# Patient Record
Sex: Male | Born: 1940 | Race: White | Hispanic: No | Marital: Married | Smoking: Former smoker
Health system: Southern US, Community
[De-identification: ages and names within clinical notes are randomized; demographics above are authoritative.]

## PROBLEM LIST (undated history)

## (undated) DIAGNOSIS — R0602 Shortness of breath: Secondary | ICD-10-CM

## (undated) DIAGNOSIS — I10 Essential (primary) hypertension: Secondary | ICD-10-CM

## (undated) DIAGNOSIS — J189 Pneumonia, unspecified organism: Secondary | ICD-10-CM

## (undated) DIAGNOSIS — R630 Anorexia: Secondary | ICD-10-CM

## (undated) DIAGNOSIS — I639 Cerebral infarction, unspecified: Secondary | ICD-10-CM

## (undated) DIAGNOSIS — C801 Malignant (primary) neoplasm, unspecified: Secondary | ICD-10-CM

## (undated) DIAGNOSIS — C3492 Malignant neoplasm of unspecified part of left bronchus or lung: Secondary | ICD-10-CM

## (undated) DIAGNOSIS — R6889 Other general symptoms and signs: Secondary | ICD-10-CM

## (undated) DIAGNOSIS — J449 Chronic obstructive pulmonary disease, unspecified: Secondary | ICD-10-CM

## (undated) HISTORY — DX: Other general symptoms and signs: R68.89

## (undated) HISTORY — PX: HEMORRHOID SURGERY: SHX153

## (undated) HISTORY — DX: Shortness of breath: R06.02

## (undated) HISTORY — PX: FINGER SURGERY: SHX640

## (undated) HISTORY — DX: Malignant neoplasm of unspecified part of left bronchus or lung: C34.92

## (undated) HISTORY — DX: Anorexia: R63.0

---

## 2005-06-07 ENCOUNTER — Ambulatory Visit: Payer: Self-pay | Admitting: Internal Medicine

## 2007-02-19 ENCOUNTER — Ambulatory Visit: Payer: Self-pay | Admitting: Gastroenterology

## 2007-11-04 ENCOUNTER — Ambulatory Visit: Payer: Self-pay | Admitting: Internal Medicine

## 2008-03-02 ENCOUNTER — Ambulatory Visit: Payer: Self-pay | Admitting: Internal Medicine

## 2008-08-12 ENCOUNTER — Ambulatory Visit: Payer: Self-pay | Admitting: Internal Medicine

## 2008-12-22 ENCOUNTER — Ambulatory Visit: Payer: Self-pay | Admitting: Ophthalmology

## 2009-01-03 ENCOUNTER — Ambulatory Visit: Payer: Self-pay | Admitting: Ophthalmology

## 2009-04-08 ENCOUNTER — Ambulatory Visit: Payer: Self-pay | Admitting: Internal Medicine

## 2009-05-05 ENCOUNTER — Ambulatory Visit: Payer: Self-pay | Admitting: Internal Medicine

## 2009-05-09 ENCOUNTER — Ambulatory Visit: Payer: Self-pay | Admitting: Internal Medicine

## 2009-06-08 ENCOUNTER — Ambulatory Visit: Payer: Self-pay | Admitting: Internal Medicine

## 2009-07-09 ENCOUNTER — Ambulatory Visit: Payer: Self-pay | Admitting: Internal Medicine

## 2009-07-26 ENCOUNTER — Ambulatory Visit: Payer: Self-pay | Admitting: Internal Medicine

## 2009-08-09 ENCOUNTER — Ambulatory Visit: Payer: Self-pay | Admitting: Internal Medicine

## 2009-12-09 ENCOUNTER — Ambulatory Visit: Payer: Self-pay | Admitting: Internal Medicine

## 2009-12-13 ENCOUNTER — Ambulatory Visit: Payer: Self-pay | Admitting: Internal Medicine

## 2010-01-09 ENCOUNTER — Ambulatory Visit: Payer: Self-pay | Admitting: Internal Medicine

## 2010-07-09 ENCOUNTER — Ambulatory Visit: Payer: Self-pay | Admitting: Internal Medicine

## 2010-07-16 ENCOUNTER — Ambulatory Visit: Payer: Self-pay | Admitting: Internal Medicine

## 2012-07-13 ENCOUNTER — Ambulatory Visit: Payer: Self-pay

## 2012-07-22 ENCOUNTER — Ambulatory Visit: Payer: Self-pay | Admitting: Internal Medicine

## 2012-12-14 ENCOUNTER — Ambulatory Visit: Payer: Self-pay | Admitting: Ophthalmology

## 2012-12-14 LAB — POTASSIUM: Potassium: 4.4 mmol/L (ref 3.5–5.1)

## 2012-12-29 ENCOUNTER — Ambulatory Visit: Payer: Self-pay | Admitting: Ophthalmology

## 2014-07-19 DIAGNOSIS — I493 Ventricular premature depolarization: Secondary | ICD-10-CM | POA: Insufficient documentation

## 2014-12-26 DIAGNOSIS — I482 Chronic atrial fibrillation, unspecified: Secondary | ICD-10-CM | POA: Insufficient documentation

## 2015-03-15 ENCOUNTER — Ambulatory Visit
Admit: 2015-03-15 | Disposition: A | Discharge status: Home / Self Care | Payer: Self-pay | Attending: Internal Medicine | Admitting: Internal Medicine

## 2015-03-16 ENCOUNTER — Ambulatory Visit: Admit: 2015-03-16 | Disposition: A | Discharge status: Home / Self Care | Payer: Self-pay | Admitting: Neurology

## 2015-03-16 ENCOUNTER — Inpatient Hospital Stay
Admit: 2015-03-16 | Disposition: A | Discharge status: Other Acute Inpatient Hospital | Payer: Self-pay | Attending: Internal Medicine | Admitting: Internal Medicine

## 2015-03-16 LAB — URINALYSIS, COMPLETE
Bacteria: NONE SEEN
Bilirubin,UR: NEGATIVE
Blood: NEGATIVE
Glucose,UR: NEGATIVE mg/dL (ref 0–75)
Ketone: NEGATIVE
Nitrite: NEGATIVE
PROTEIN: NEGATIVE
Ph: 7 (ref 4.5–8.0)
Specific Gravity: 1.005 (ref 1.003–1.030)
Squamous Epithelial: NONE SEEN
WBC UR: 2 /HPF (ref 0–5)

## 2015-03-16 LAB — CBC WITH DIFFERENTIAL/PLATELET
Basophil #: 0 10*3/uL (ref 0.0–0.1)
Basophil %: 0.5 %
Eosinophil #: 0 10*3/uL (ref 0.0–0.7)
Eosinophil %: 0.4 %
HCT: 46.6 % (ref 40.0–52.0)
HGB: 15.4 g/dL (ref 13.0–18.0)
Lymphocyte #: 1.2 10*3/uL (ref 1.0–3.6)
Lymphocyte %: 13.3 %
MCH: 29.2 pg (ref 26.0–34.0)
MCHC: 33.1 g/dL (ref 32.0–36.0)
MCV: 88 fL (ref 80–100)
Monocyte #: 0.7 x10 3/mm (ref 0.2–1.0)
Monocyte %: 8.4 %
NEUTROS ABS: 6.7 10*3/uL — AB (ref 1.4–6.5)
Neutrophil %: 77.4 %
Platelet: 202 10*3/uL (ref 150–440)
RBC: 5.29 10*6/uL (ref 4.40–5.90)
RDW: 13.9 % (ref 11.5–14.5)
WBC: 8.7 10*3/uL (ref 3.8–10.6)

## 2015-03-16 LAB — LIPID PANEL
Cholesterol: 136 mg/dL
HDL Cholesterol: 46 mg/dL
Ldl Cholesterol, Calc: 77 mg/dL
Triglycerides: 66 mg/dL
VLDL Cholesterol, Calc: 13 mg/dL

## 2015-03-16 LAB — COMPREHENSIVE METABOLIC PANEL
Albumin: 3.8 g/dL
Alkaline Phosphatase: 95 U/L
Anion Gap: 12 (ref 7–16)
BUN: 14 mg/dL
Bilirubin,Total: 0.9 mg/dL
Calcium, Total: 8.9 mg/dL
Chloride: 101 mmol/L
Co2: 23 mmol/L
Creatinine: 1.11 mg/dL
EGFR (African American): >60
EGFR (Non-African Amer.): >60
Glucose: 164 mg/dL — ABNORMAL HIGH
Potassium: 3.7 mmol/L
SGOT(AST): 30 U/L
SGPT (ALT): 25 U/L
Sodium: 136 mmol/L
Total Protein: 7.2 g/dL

## 2015-03-16 LAB — TSH: Thyroid Stimulating Horm: 3.258 u[IU]/mL

## 2015-03-16 LAB — PROTIME-INR
INR: 1.1
Prothrombin Time: 14.1 secs

## 2015-03-16 LAB — MAGNESIUM: Magnesium: 1.9 mg/dL

## 2015-03-16 LAB — TROPONIN I
Troponin-I: 0.05 ng/mL — ABNORMAL HIGH
Troponin-I: 0.05 ng/mL — ABNORMAL HIGH
Troponin-I: 0.06 ng/mL — ABNORMAL HIGH

## 2015-03-17 ENCOUNTER — Ambulatory Visit (HOSPITAL_COMMUNITY)
Admission: AD | Admit: 2015-03-17 | Discharge: 2015-03-17 | Disposition: A | Discharge status: Home / Self Care | Payer: Medicare Other | Source: Other Acute Inpatient Hospital | Attending: Internal Medicine | Admitting: Internal Medicine

## 2015-03-17 DIAGNOSIS — I639 Cerebral infarction, unspecified: Secondary | ICD-10-CM | POA: Insufficient documentation

## 2015-03-17 DIAGNOSIS — R531 Weakness: Secondary | ICD-10-CM | POA: Diagnosis not present

## 2015-03-17 DIAGNOSIS — I482 Chronic atrial fibrillation, unspecified: Secondary | ICD-10-CM | POA: Insufficient documentation

## 2015-03-17 LAB — BASIC METABOLIC PANEL
ANION GAP: 7 (ref 7–16)
BUN: 12 mg/dL
CALCIUM: 8.6 mg/dL — AB
Chloride: 107 mmol/L
Co2: 25 mmol/L
Creatinine: 1.04 mg/dL
EGFR (African American): >60
EGFR (Non-African Amer.): >60
GLUCOSE: 142 mg/dL — AB
POTASSIUM: 4 mmol/L
SODIUM: 139 mmol/L

## 2015-03-17 LAB — CBC WITH DIFFERENTIAL/PLATELET
Basophil #: 0 10*3/uL (ref 0.0–0.1)
Basophil %: 0.6 %
EOS ABS: 0.1 10*3/uL (ref 0.0–0.7)
Eosinophil %: 1.2 %
HCT: 45.4 % (ref 40.0–52.0)
HGB: 15.1 g/dL (ref 13.0–18.0)
LYMPHS PCT: 15 %
Lymphocyte #: 1.1 10*3/uL (ref 1.0–3.6)
MCH: 29.5 pg (ref 26.0–34.0)
MCHC: 33.4 g/dL (ref 32.0–36.0)
MCV: 88 fL (ref 80–100)
Monocyte #: 0.7 x10 3/mm (ref 0.2–1.0)
Monocyte %: 9.3 %
NEUTROS PCT: 73.9 %
Neutrophil #: 5.3 10*3/uL (ref 1.4–6.5)
Platelet: 176 10*3/uL (ref 150–440)
RBC: 5.14 10*6/uL (ref 4.40–5.90)
RDW: 14 % (ref 11.5–14.5)
WBC: 7.2 10*3/uL (ref 3.8–10.6)

## 2015-03-24 ENCOUNTER — Other Ambulatory Visit: Payer: Self-pay | Admitting: Internal Medicine

## 2015-03-24 DIAGNOSIS — R059 Cough, unspecified: Secondary | ICD-10-CM

## 2015-03-24 DIAGNOSIS — R05 Cough: Secondary | ICD-10-CM

## 2015-03-31 NOTE — Op Note (Signed)
DATE OF BIRTH:  05/05/1941  DATE OF PROCEDURE:  12/29/2012  PREOPERATIVE DIAGNOSIS: Visually significant cataract of the left eye.   POSTOPERATIVE DIAGNOSIS: Visually significant cataract of the left eye.   OPERATIVE PROCEDURE: Cataract extraction by phacoemulsification with implant of intraocular lens to left eye.   SURGEON: Birder Robson, MD.   ANESTHESIA:  1. Managed anesthesia care.  2. Topical tetracaine drops followed by 2% Xylocaine jelly applied in the preoperative holding area.   COMPLICATIONS: None.   TECHNIQUE:  Stop and chop.  DESCRIPTION OF PROCEDURE: The patient was examined and consented in the preoperative holding area where the aforementioned topical anesthesia was applied to the left eye and then brought back to the Operating Room where the left eye was prepped and draped in the usual sterile ophthalmic fashion and a lid speculum was placed. A paracentesis was created with the side port blade and the anterior chamber was filled with viscoelastic. A near clear corneal incision was performed with the steel keratome. A continuous curvilinear capsulorrhexis was performed with a cystotome followed by the capsulorrhexis forceps. Hydrodissection and hydrodelineation were carried out with BSS on a blunt cannula. The lens was removed in a stop and chop technique and the remaining cortical material was removed with the irrigation-aspiration handpiece. The capsular bag was inflated with viscoelastic and the Tecnis SN60WF 19.0-diopter lens, serial number [id number] was placed in the capsular bag without complication. The remaining viscoelastic was removed from the eye with the irrigation-aspiration handpiece. The wounds were hydrated. The anterior chamber was flushed with Miostat and the eye was inflated to physiologic pressure. 0.1 mL of cefuroxime concentration 10 mg/mL was placed in the anterior chamber. The wounds were found to be water tight. The eye was dressed with Vigamox. The  patient was given protective glasses to wear throughout the day and a shield with which to sleep tonight. The patient was also given drops with which to begin a drop regimen today and will follow-up with me in one day.    ____________________________ Livingston Diones. Iyonna Rish, MD wlp:ms D: 12/29/2012 15:44:55 ET T: 12/29/2012 23:17:30 ET JOB#: [id number]  cc: Taraoluwa Thakur L. Mariusz Jubb, MD, <Dictator> Livingston Diones Sarahanne Novakowski MD ELECTRONICALLY SIGNED 01/14/2013 14:23

## 2015-04-07 ENCOUNTER — Ambulatory Visit (HOSPITAL_COMMUNITY): Payer: Medicare Other | Attending: Internal Medicine | Admitting: Physical Therapy

## 2015-04-07 ENCOUNTER — Ambulatory Visit (HOSPITAL_COMMUNITY): Payer: Medicare Other | Admitting: Occupational Therapy

## 2015-04-07 ENCOUNTER — Encounter (HOSPITAL_COMMUNITY): Payer: Self-pay | Admitting: Occupational Therapy

## 2015-04-07 DIAGNOSIS — I639 Cerebral infarction, unspecified: Secondary | ICD-10-CM

## 2015-04-07 DIAGNOSIS — R278 Other lack of coordination: Secondary | ICD-10-CM | POA: Diagnosis not present

## 2015-04-07 DIAGNOSIS — I69351 Hemiplegia and hemiparesis following cerebral infarction affecting right dominant side: Secondary | ICD-10-CM | POA: Diagnosis present

## 2015-04-07 DIAGNOSIS — R279 Unspecified lack of coordination: Secondary | ICD-10-CM

## 2015-04-07 DIAGNOSIS — R29898 Other symptoms and signs involving the musculoskeletal system: Secondary | ICD-10-CM

## 2015-04-07 DIAGNOSIS — R2689 Other abnormalities of gait and mobility: Secondary | ICD-10-CM

## 2015-04-07 NOTE — Patient Instructions (Signed)
Strengthening: Straight Leg Raise (Phase 1)   Tighten muscles on front of right thigh, then lift leg __18__ inches from surface, keeping knee locked.  Repeat ___10_ times per set. Do __1__ sets per session. Do ___2_ sessions per day.  http://orth.exer.us/614   Copyright  VHI. All rights reserved.  Bridging   Slowly raise buttocks from floor, keeping stomach tight. Repeat __10__ times per set. Do __1__ sets per session. Do __2__ sessions per day.  http://orth.exer.us/1096   Copyright  VHI. All rights reserved.  Strengthening: Hip Abduction (Side-Lying)   Tighten muscles on front of right thigh, then lift leg _18___ inches from surface, keeping knee locked.  Repeat _10___ times per set. Do _1___ sets per session. Do __3__ sessions per day.  http://orth.exer.us/622   Copyright  VHI. All rights reserved.  Functional Quadriceps: Chair Squat   Keeping feet flat on floor, shoulder width apart, squat as low as is comfortable. Use support as necessary. Repeat __10__ times per set. Do _1___ sets per session. Do ___2_ sessions per day.  http://orth.exer.us/736   Copyright  VHI. All rights reserved.  Straight Leg Raise (Prone)   Abdomen and head supported, keep left knee locked and raise leg at hip. Avoid arching low back. Repeat _10___ times per set. Do _1___ sets per session. Do _2___ sessions per day.  http://orth.exer.us/1112   Copyright  VHI. All rights reserved.  Heel Raise: Bilateral (Standing)   Rise on balls of feet. Repeat _10___ times per set. Do 1____ sets per session. Do __2__ sessions per day.  http://orth.exer.us/38   Copyright  VHI. All rights reserved.

## 2015-04-07 NOTE — Patient Instructions (Signed)
Home Exercises Program Theraputty Exercises  Do the following exercises 2 times a day using your affected hand.  1. Roll putty into a ball.  2. Make into a pancake.  3. Roll putty into a roll.  4. Pinch along log with first finger and thumb.   5. Make into a ball.  6. Roll it back into a log.   7. Pinch using thumb and side of first finger.  8. Roll into a ball, then flatten into a pancake.  9. Using your fingers, make putty into a mountain.   

## 2015-04-07 NOTE — Therapy (Signed)
[place], [place], [address] Phone: [phone]   Fax:  [phone]  Occupational Therapy Evaluation  Patient Details  Name: Curtis Freeman MRN: [id number] Date of Birth: February 25, 1941 Referring Provider:  Lavera Guise, MD  Encounter Date: 04/07/2015      OT End of Session - 04/07/15 1612    Visit Number 1   Number of Visits 24   Date for OT Re-Evaluation 06/06/15  mini reassessment 05/05/2015   Authorization Type Medicare A & B   Authorization Time Period Before 10th visit    Authorization - Visit Number 1   Authorization - Number of Visits 10   OT Start Time 1430   OT Stop Time 1502   OT Time Calculation (min) 32 min   Activity Tolerance Patient tolerated treatment well   Behavior During Therapy [facility] for tasks assessed/performed      History reviewed. No pertinent past medical history.  No past surgical history on file.  There were no vitals filed for this visit.  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 Pt reports he feels he is improving but has difficulty using his right (dominant) hand for daily tasks. Dr. Shana Chute referred pt to occupational therapy for evaluation and treatment.    Special Tests FOTO Score: 58/100(42% impairment)   Patient Stated Goals To be able to use my right hand.    Currently in Pain? No/denies           [facility] OT Assessment - 04/07/15 1435    Assessment   Diagnosis Left pontine stroke   Onset Date 03/17/15   Prior Therapy 2 weeks at [place]   Has the patient fallen in the past 6 months No    Has the patient had a decrease in activity level because of a fear of falling?  No   Is the patient reluctant to leave their home because of a fear of falling?  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    ADL   ADL comments Pt reports he has difficulty using his right hand for all tasks including manipulating buttons/zippers when dressing, holding onto utensils, completing grooming tasks, carrying objects, and writing.   Written Expression   Dominant Hand Right   Handwriting 50% legible   Vision - History   Baseline Vision No visual deficits   Cognition   Overall Cognitive Status Within Functional Limits for tasks assessed   Coordination   Gross Motor Movements are Fluid and Coordinated Yes   Fine Motor Movements are Fluid and Coordinated No   Coordination and Movement Description Pt has decreased fine motor coordination in right hand and digits   9 Hole Peg Test Left;Right   Right 9 Hole Peg Test 57"   Left 9 Hole Peg Test 26"   AROM   Overall AROM Comments  WNL   AROM Assessment Site Shoulder   Right/Left Shoulder Right   Strength   Overall Strength Comments Shoulder and wrist [facility]   Strength Assessment Site Shoulder;Hand;Wrist   Right Shoulder Flexion 4+/5   Right Shoulder ABduction 4+/5   Right Shoulder Internal Rotation 4/5   Right Shoulder External Rotation 4/5   Right/Left Wrist Right   Right Wrist Flexion 4/5   Right Wrist Extension 4/5   Grip (lbs) R: 36#   Right Hand Lateral Pinch 9 lbs   Right Hand 3 Point Pinch 8 lbs   Grip (lbs) L: 78#   Left Hand Lateral Pinch 20 lbs   Left Hand 3 Point Pinch 16 lbs                         OT Education - 04/07/15 1611    Education provided Yes   Education Details Red theraputty HEP   Person(s)  Educated Patient   Methods Explanation;Demonstration;Handout   Comprehension Verbalized understanding;Returned demonstration          OT Short Term Goals - 04/07/15 1617    OT SHORT TERM GOAL #1   Title Pt will be educated on HEP.    Time 4   Period Weeks   Status New   OT SHORT TERM GOAL #2   Title Pt will increase right grip strength by 10# to increase ability to hold razor and complete shaving tasks.    Time 4   Period Weeks   Status New   OT SHORT TERM GOAL #3   Title Pt will increase right pinch strength by 5# to increase ability to hold onto utensils when eating.    Time 4   Period Weeks   Status New   OT SHORT TERM GOAL #4   Title Pt will improve coordination by completing 9 hole peg test in under 40" with right hand.    Time 4   Period Weeks   Status New           OT Long Term Goals - 04/07/15 1620    OT LONG TERM GOAL #1   Title Pt will return to prior level of functioning and independence in all B/IADL tasks.    Time 8   Period Weeks   Status New   OT LONG TERM GOAL #2   Title Pt will increase right grip strength by 15# to increase ability to hold onto tools and complete yard work tasks.    Time 8   Period Weeks   Status New   OT LONG TERM GOAL #3   Title Pt will increase right pinch strength by 8# to increase ability to complete writing tasks legibly.    Time 8   Period Weeks   Status New   OT LONG TERM GOAL #4   Title Pt will increase coordination by completing 9 hole peg test in under 30" using right hand.    Time 8   Period Weeks   Status New               Plan - 04/07/15 1613    Clinical Impression Statement A: Pt is a 74 y/o male s/p left CVA resulting in weakness and decreased coordination in right hand and digits causing difficulty completing B/ADL and fine motor tasks. Pt was release from [facility] inpatient rehabilitation on 04/04/15 after a 2 week stay. Pt reports his hand is a lot better than it was but he still  has difficulty with  all daily activities. Pt was provided with red theraputty HEP.    Pt will benefit from skilled therapeutic intervention in order to improve on the following deficits (Retired) Decreased strength;Impaired UE functional use;Decreased activity tolerance;Decreased coordination;Decreased range of motion   Rehab Potential Good   OT Frequency 2x / week   OT Duration 8 weeks   OT Treatment/Interventions Self-care/ADL training;Passive range of motion;Patient/family education;Electrical Stimulation;Therapeutic exercise;Manual Therapy;Therapeutic activities   Plan P: Skilled OT intervention to, improve strength, coordination, and range of motion in order to return to prior level of independence with all B/IADLs and leisure activities.Next visit: grip and pinch strengthening exerces, coordination exercises.           G-Codes - 05-May-2015 1625    Functional Assessment Tool Used FOTO Score: 58/100 (42% impairment)   Functional Limitation Carrying, moving and handling objects   Carrying, Moving and Handling Objects Current Status (W3142) At least 40 percent but less than 60 percent impaired, limited or restricted   Carrying, Moving and Handling Objects Goal Status (J6701) At least 1 percent but less than 20 percent impaired, limited or restricted      Problem List There are no active problems to display for this patient.   Guadelupe Sabin, OTR/L  [phone]  2015/05/05, 4:29 PM  [facility] [address] [place], [place], [address] Phone: [phone]   Fax:  [phone]

## 2015-04-07 NOTE — Therapy (Signed)
[place], [place], [address] Phone: [phone]   Fax:  [phone]  Physical Therapy Evaluation  Patient Details  Name: Curtis Freeman MRN: [id number] Date of Birth: 1941/08/06 Referring Provider:  Lavera Guise, MD  Encounter Date: 04/07/2015      PT End of Session - 04/07/15 1432    Visit Number 1   Number of Visits 8   Date for PT Re-Evaluation 05/07/15   Authorization Type medicare   PT Start Time 6962   PT Stop Time 1430   PT Time Calculation (min) 45 min   Activity Tolerance Patient tolerated treatment well      No past medical history on file.  No past surgical history on file.  There were no vitals filed for this visit.  Visit Diagnosis:  Weakness of right leg  Unstable balance  Decreased coordination      Subjective Assessment - 04/07/15 1400    Subjective Mr. Risden states that he had a stroke on 03/17/2015.  He went to IP therapy and has been discharged on 04/05/2015.  He states that he is getting tired easily.  He states everything is harder than it was in the past.   He is using his cane.    Pertinent History COPD, HTN   How long can you sit comfortably? no problem   How long can you stand comfortably? tired after 5 minutes .    How long can you walk comfortably? walking with a cane for up five minutes.    Currently in Pain? No/denies            [facility] PT Assessment - 04/07/15 0001    Assessment   Medical Diagnosis Lt CVA    Onset Date 03/17/15   Next MD Visit 04/17/2015   Prior Therapy IP rehab    Precautions   Precautions None   Restrictions   Weight Bearing Restrictions No   Balance Screen   Has the patient fallen in the past 6 months No   Has the patient had a decrease in activity level because of a fear of falling?  No   Is the patient reluctant to leave their home because of a fear of falling?  No   Home Environment   Living Enviornment Private residence   Living  Arrangements Spouse/significant other   Type of [place] to enter   Entrance Stairs-Number of Steps [place] One level   Prior Function   Level of Independence Independent with basic ADLs   Vocation Retired   Leisure yard work; walking for 2 miles; fix things around the house and read.    Cognition   Overall Cognitive Status Within Functional Limits for tasks assessed   Observation/Other Assessments   Focus on Therapeutic Outcomes (FOTO)  58   Functional Tests   Functional tests Sit to Stand   Single Leg Stance   Comments Rt unable Lt 3    Sit to Stand   Comments 5 in 9 seconds   normal for age is 11.6 seconds pt is above normal    Strength   Right Hip Flexion 4+/5   Right Hip Extension 4/5   Right Hip ABduction 4+/5   Right Knee Flexion 5/5   Right Knee Extension 5/5   Right Ankle Dorsiflexion 5/5   Right Ankle Plantar Flexion 3-/5   Merrilee Jansky Balance Test   Sit  to Stand Able to stand without using hands and stabilize independently   Standing Unsupported Able to stand safely 2 minutes   Sitting with Back Unsupported but Feet Supported on Floor or Stool Able to sit safely and securely 2 minutes   Stand to Sit Sits safely with minimal use of hands   Transfers Able to transfer safely, minor use of hands   Standing Unsupported with Eyes Closed Able to stand 10 seconds safely   Standing Ubsupported with Feet Together Able to place feet together independently and stand 1 minute safely   From Standing, Reach Forward with Outstretched Arm Can reach confidently >25 cm (10")   From Standing Position, Pick up Object from Floor Able to pick up shoe safely and easily   From Standing Position, Turn to Look Behind Over each Shoulder Looks behind one side only/other side shows less weight shift   Turn 360 Degrees Able to turn 360 degrees safely in 4 seconds or less   Standing Unsupported, Alternately Place Feet on Step/Stool Able to  stand independently and safely and complete 8 steps in 20 seconds   Standing Unsupported, One Foot in Front Able to plae foot ahead of the other independently and hold 30 seconds   Standing on One Leg Tries to lift leg/unable to hold 3 seconds but remains standing independently   Total Score 51                   [facility] Adult PT Treatment/Exercise - 04/07/15 0001    Exercises   Exercises Knee/Hip   Knee/Hip Exercises: Standing   Heel Raises 10 reps   Knee/Hip Exercises: Supine   Bridges 10 reps   Straight Leg Raises 10 reps   Knee/Hip Exercises: Sidelying   Hip ABduction 10 reps   Knee/Hip Exercises: Prone   Hip Extension 10 reps                PT Education - 04/07/15 1432    Education provided Yes   Education Details HEP for strengtheing as well as walking regieme   Person(s) Educated Patient   Methods Explanation;Handout   Comprehension Verbalized understanding          PT Short Term Goals - 04/07/15 1628    PT SHORT TERM GOAL #1   Title I in Hep   Time 2   Period Days   Status New   PT SHORT TERM GOAL #2   Title Pt to be able to SLS for 10 seconds B to reduce risk of falls    Time 2   Period Weeks   Status New   PT SHORT TERM GOAL #3   Title Pt to be walking in the house without an assistive device   Time 2   Period Weeks   PT SHORT TERM GOAL #4   Title Pt to be able to stand for 15 minutes without fatige to take a shower   Time 2   Period Weeks           PT Long Term Goals - 04/07/15 1629    PT LONG TERM GOAL #1   Title I in advance HEP   Time 4   Period Weeks   PT LONG TERM GOAL #2   Title Pt to be walking without a cane in and outside the home   Time 4   Period Weeks   PT LONG TERM GOAL #3   Title Pt to be walking for at least 30 mintues  at lease 4x a week for better health habits    Time 4   Period Weeks   PT LONG TERM GOAL #4   Title Pt strength to be wnl    Time 4   Period Weeks   PT LONG TERM GOAL #5   Title Pt to  beworking out in the yard for two hours withotu fatigue    Time 4   Period Weeks               Plan - Apr 15, 2015 1433    Clinical Impression Statement Mr. Flatt is a 74 yo male who has had a recent Lt CVA affecting his Rt side.  At this time Mr. Cambria is being referred for out-patient therapy.  Examination demonstrates decreased balance, decreased activity tolerance  and decrasd sterngth.  Mr. Correa will benefit from skilled PT to address these issues and return him back to his previous level of functioning.    Pt will benefit from skilled therapeutic intervention in order to improve on the following deficits Decreased activity tolerance;Decreased balance;Decreased strength;Decreased coordination   Rehab Potential Good   PT Frequency 2x / week   PT Duration 4 weeks   PT Treatment/Interventions Gait training;Stair training;Functional mobility training;Therapeutic activities;Balance training;Therapeutic exercise;Patient/family education;Neuromuscular re-education   PT Next Visit Plan begin high level activtiy tandem on foam, side step over hurdles, retrowalking, balance master, Warrior poses, functioanl squat.  Progress to lunge walking, sumo walkng, stair climbing.    PT Home Exercise Plan given   Consulted and Agree with Plan of Care Patient          G-Codes - 04-15-2015 1437    Functional Assessment Tool Used foto   Functional Limitation Mobility: Walking and moving around   Mobility: Walking and Moving Around Current Status [phone]) At least 40 percent but less than 60 percent impaired, limited or restricted   Mobility: Walking and Moving Around Goal Status (R4854) At least 20 percent but less than 40 percent impaired, limited or restricted       Problem List There are no active problems to display for this patient.  Rayetta Humphrey, PT CLT [phone] Apr 15, 2015, 4:33 PM  [facility] [place] [address] [place], [place],  [address] Phone: [phone]   Fax:  [phone]

## 2015-04-09 NOTE — Consult Note (Signed)
PATIENT NAME:  Curtis Freeman, Curtis Freeman MR#:  [id number] DATE OF BIRTH:  Feb 05, 1941  DATE OF CONSULTATION:  03/16/2015  REFERRING PHYSICIAN:   CONSULTING PHYSICIAN:  Leotis Pain, MD  REASON FOR CONSULTATION::  Right-sided weakness.   HISTORY OF PRESENT ILLNESS:  This is a pleasant 74 year old gentleman with past medical history of atrial fibrillation, was on Xarelto, last dose of Xarelto about 48 hours prior to admission. The patient presented with sudden onset of right upper and right lower extremity weakness at the time while watching TV.  The reason Xarelto was held, the patient was scheduled for prostate biopsy at the end of next week and the patient was to hold Xarelto for 7 days as per his physician prior to the biopsy for suspected elevated PSA and suspicion of prostate cancer.  On admission CAT scan of the head showed chronic right parietal and occipital infarct. Status post MRI, MRI shows a small pontine infarct most likely supplied by one of the perforators.   PAST MEDICAL HISTORY: COPD, arthritis, hemorrhoids, hypertension, atrial fibrillation.   HOME MEDICATIONS: Reviewed, including Xarelto 20 mg daily.   PAST SURGICAL HISTORY: Trigger finger release and hemorrhoidectomy.    ALLERGIES: No known drug allergies.   FAMILY HISTORY: Coronary artery disease, dementia, cardiomyopathy grandmother.   SOCIAL HISTORY: Ex-smoker. Social alcohol use. Denies any illicit drug use.   REVIEW OF SYSTEMS:  No fever. No fatigue.  Some generalized weakness. No shortness of breath. No chest pain. Positive weakness on right upper and right lower extremity which is new.  No anxiety. No depression.   NEUROLOGIC EVALUATION: The patient awake, alert, and oriented to time, place, location, and the reason why he is in the hospital. Facial sensation intact. Facial motor intact. Speech, no signs of dysarthria or aphasia noted. Positive right facial droop. Tongue is midline. Uvula elevates symmetrically. Shoulder  shrug intact. Motor strength, the patient has weakness in the right upper and right lower extremity proximally in the upper and lower extremities 4 out of 5, and distally finger grip and plantar and dorsiflexion on the right side the patient is 3 out of 5, 5 out of 5 on the left upper and left lower extremity. Sensation diminished on the right side. Right upper and right lower extremity reflexes diminished throughout.   IMPRESSION: A 74 year old gentleman admitted with small perforator pontine stroke likely in the setting off Xarelto due to awaiting prostate biopsy.   PLAN: Physical therapy, occupational therapy, speech therapy is at bedside, likely will need acute rehabilitation placement. Based on the location of the stroke and since it is so small would restart Xarelto 20 mg daily. Do not need heparin at this point. Discontinue the aspirin. Continue the statin therapy indefinitely. Would hold off prostate biopsy at this point and follow up with his primary doctor. This case was discussed with the patient's family at bedside as well as the patient himself.    Thank you.  It was a pleasure seeing this patient.     ____________________________ Leotis Pain, MD yz:bu D: 03/16/2015 13:19:07 ET T: 03/16/2015 13:59:15 ET JOB#: [id number]  cc: Leotis Pain, MD, <Dictator> Leotis Pain MD ELECTRONICALLY SIGNED 04/04/2015 21:25

## 2015-04-09 NOTE — H&P (Signed)
PATIENT NAME:  HARRIS, PENTON MR#:  [id number] DATE OF BIRTH:  [birth year]  DATE OF ADMISSION:  03/16/2015  CHIEF COMPLAINT: Stroke.   HISTORY OF PRESENT ILLNESS: This is a 74 year old male who at 9:00 p.m. on the day prior to admission began experiencing right arm and leg weakness; this is a first time he has had a symptom like this. He was watching TV when it began to occur. He also noticed that he began having some slurred speech shortly after this. He feels like weakness first began his foot and then progressed. The patient did not come in right away; he waited until about 3:00 in the morning and when symptoms were persistent and he decided to come into the ED. He was brought by his wife. The patient states that he has a history of atrial fibrillation and was on Xarelto. His Xarelto was stopped the day prior to the start of his symptoms in preparation for an upcoming prostate biopsy. In the ED, the patient was found to have on CT scan an age indeterminate right parietal occipital infarct from before. No hemorrhage on CT exam and no other significant findings on CT. Blood work is largely negative except for a mildly elevated troponin at 0.05. The patient states he had no other symptoms. He has had no recent fever, chills, chest pain, shortness of breath, or anything like that; see full review of systems below. The hospitalists were called for admission for stroke. He was not given tPA in the ED due to the length of time from onset of symptoms partially but also due to this age indeterminate parietal occipital infarct found on CT scan.   PRIMARY MEDICAL DOCTOR: Lavera Guise, MD   PAST MEDICAL HISTORY: COPD, arthritis, hemorrhoids, hypertension, atrial fibrillation.   CURRENT MEDICATIONS: Losartan 100 mg daily; hydrochlorothiazide 12.5 mg daily; Xarelto 20 mg daily, although this was held in preparation for a prostate biopsy as above.   PAST SURGICAL HISTORY: Trigger finger release, hemorrhoidectomy.    ALLERGIES: No known drug allergies.   FAMILY HISTORY: CAD, dementia, and grandmother with an enlarged heart.   SOCIAL HISTORY: Ex-smoker, quit 10 years ago. Alcohol use is 2-3 ounces of [place] about every other day. Denies illicit drug use.   REVIEW OF SYSTEMS:  CONSTITUTIONAL: No fever, fatigue, or generalized weakness.  EYES: No blurred or double vision, pain or redness.  EAR, NOSE, AND THROAT: No ear pain. No hearing loss. The patient denies difficulty swallowing, although he does have some dysarthria.  RESPIRATORY: Mild chronic cough. No dyspnea or painful respiration.  CARDIOVASCULAR: No chest pain, edema, or palpitations.  GASTROINTESTINAL: No nausea, vomiting, diarrhea, abdominal pain, or constipation.  GENITOURINARY: No dysuria, hematuria, or frequency.  ENDOCRINE: No nocturia, thyroid problems, heat or cold intolerance.  HEMATOLOGIC AND LYMPHATIC: No easy bruising, bleeding, swollen glands.  INTEGUMENTARY: No acne, rash, or lesion.  MUSCULOSKELETAL: No acute arthritis, joint swelling, or gout.  NEUROLOGICAL: He has dysarthria, right-sided weakness in both his upper and lower extremities. No headache.  PSYCHIATRIC: No anxiety, insomnia, or depression.   PHYSICAL EXAMINATION:  VITAL SIGNS: Blood pressure 137/87, pulse 95, temperature 98.1, respirations 18, oxygen saturation is 94% on room air.  GENERAL: This is a well-nourished male lying in bed in no acute distress.  HEENT: Pupils equal, round, reactive to light and accommodation. Extraocular movements intact. No scleral icterus. Moist mucosal membranes.  NECK: Thyroid is not enlarged. Neck is supple, nontender. No masses. No cervical adenopathy. No JVD.  RESPIRATORY:  Clear to auscultation bilaterally with no rales, rhonchi, or wheezing. No respiratory distress.  CARDIOVASCULAR: Irregular rhythm with a controlled rate. No murmur, rubs, or gallops on exam. Good pedal pulses with no lower extremity edema.  ABDOMEN: Soft,  nontender, nondistended with good bowel sounds.  MUSCULOSKELETAL: He has right-sided weakness in his upper and lower extremity. He does have full spontaneous range of motion throughout with no cyanosis or clubbing.  SKIN: No rash or lesions. Skin is warm, dry, and intact.  LYMPHATIC: No adenopathy.  NEUROLOGIC: He has some right-sided facial droop. He has tongue deviation to the right side. Sensation is intact throughout. He has some dysarthria as well as right hemiparesis in upper and lower extremity with 4/5 strength.  PSYCHIATRIC: Alert and oriented x 3, cooperative, not confused or agitated.   LABORATORY DATA: White count 8.7, hemoglobin 15.4, hematocrit 46.6, platelets 202,000. Sodium 136, potassium 3.7, chloride 101, bicarbonate 23, BUN 14, creatinine 1.11, glucose 164, calcium 8.9. Total protein 7.2, albumin 3.8, total bilirubin 0.9, alkaline phosphatase 95, AST 30, ALT 25. Troponin 0.05. INR 1.1.   RADIOLOGY: Chest x-ray: COPD and mild pulmonary fibrosis, new increased density in the superior segment of the left lower lobe, also increased density in the left hilar region. Chest CT scanning is recommended to exclude underlying mass and/or pneumonia. There is no CHF or acute cardiopulmonary disease. CT head: Age indeterminate ischemia demonstrated in the right posterior parietal lobe, chronic atrophy and small vessel changes. No acute intracranial hemorrhage.   ASSESSMENT AND PLAN:  1.  Cerebrovascular accident: Likely left middle cerebral artery distribution, possibly due to holding of his Xarelto in preparation for a prostate biopsy. We will consult neurology. Order speech therapy, occupational therapy, and physical therapy. We will get an MRI of his brain, carotid Dopplers, echocardiogram. The patient will be admitted on telemetry. We will defer to neurology's recommendations for further workup. We started atorvastatin and we will check a lipid profile as well.  2.  Atrial fibrillation: The  patient is rate controlled right now. We will monitor this condition and address the question of a blood thinner after neurology has seen the patient.  3.  Chest mass: This was seen on chest x-ray. CT of chest was recommended; this has been ordered.  4.  Hypertension: This is a chronic problem for this patient. We will for permissive hypertension for the first 24 hours to a goal less than 220/110. We will hold his home antihypertensives for now due to this.  5.  Chronic obstructive pulmonary disease: The patient is not currently on medication for this. He has good oxygen saturations on room air. We will continue to monitor and provide any treatment p.r.n.  6.  Deep vein thrombosis prophylaxis: Sequential compression devices only.   CODE STATUS: This patient is full code.   TIME SPENT ON THIS ADMISSION: 50 minutes.    ____________________________ Wilford Corner. Jannifer Franklin, MD dfw:bm D: 03/16/2015 06:21:47 ET T: 03/16/2015 06:34:01 ET JOB#: [id number]  cc: Wilford Corner. Jannifer Franklin, MD, <Dictator> Cameron Katayama Fawn Kirk MD ELECTRONICALLY SIGNED 03/16/2015 7:42

## 2015-04-09 NOTE — Discharge Summary (Signed)
PATIENT NAME:  Curtis Freeman, Curtis Freeman MR#:  [id number] DATE OF BIRTH:  26-May-1941  DATE OF ADMISSION:  03/16/2015 DATE OF DISCHARGE:  03/17/2015   ADMITTING PHYSICIAN: Lance Coon, MD   DISCHARGING PHYSICIAN: Gladstone Lighter, MD    PRIMARY CARE PHYSICIAN: Clayborn Bigness, MD   [place]:  Neurology consultation by Dr. Irish Elders.   DISCHARGE DIAGNOSES:  1.  Acute cerebrovascular accident, left pontine infarct with right hemiplegia and speech and swallowing difficulty.  2.  Hypertension.  3.  Atrial fibrillation.  4.  Right-sided pneumonia.   DISCHARGE HOME MEDICATIONS:  1.  Losartan 100 mg p.o. daily.  2.  Xarelto 20 mg p.o. daily.  3.  Atorvastatin 20 mg p.o. daily.  4.  Levaquin 500 mg p.o. daily for 7 days.  5.  Dulera 5/100 mcg 2 puffs twice a day.    DISCHARGE DIET: Low-sodium diet dysphagia 3 with chopped meat, nectar-thick liquids.    DISCHARGE ACTIVITY: As tolerated.    FOLLOWUP INSTRUCTIONS:  1.  The patient is being transferred to acute inpatient rehab.  2.  Physical therapy, occupational therapy and speech therapy follow-up.  3.  Repeat swallow evaluation in 1 week.  4.  PCP follow-up in 1 week.  5.  Neurology follow-up in 3 to 4 weeks.  6.  CT chest follow-up in six weeks as an outpatient.   LABORATORIES AND IMAGING STUDIES PRIOR TO DISCHARGE:  WBC 7.2, hemoglobin 15.1, hematocrit 45.4, platelet count 176,000.   Sodium 139, potassium 4.0, chloride 107, bicarbonate 25, BUN 12, creatinine 1.04, glucose 142, and calcium of 8.6. Ultrasound carotid Dopplers bilaterally showing no evidence of hemodynamically significant stenosis.  Echo Doppler showing LV ejection fraction is 55% to 60%, normal global LV systolic function, moderately increased LV posterior wall thickness.     Troponins are borderline elevated at 0.05 and 0.06.  CT of the chest with contrast showing posterior segment right upper lobe airspace consolidation. No masses noted.  Lymph node  enlargement in the mediastinum could be reactive; however follow-up CT in 4 to 6 weeks advised to exclude possibility of underlying mass obscured by the consolidation. 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   MRI of the brain without contrast showing acute left paramedian pontine infarct, likely related basilar small vessel disease.  Also old right parietal infarct with laminar necrosis noted.  Urinalysis negative for any infection.   BRIEF HOSPITAL COURSE: Curtis Freeman is a 74 year old Caucasian male who is very independent at baseline, history of hypertension, atrial fibrillation on Xarelto who held his Xarelto for 2 days prior to this admission for a prostate biopsy scheduled, presents to the hospital secondary to acute right-sided weakness.   1.  Acute cerebrovascular accident with right hemiplegia, speech and swallowing changes. The patient presented outside of the [place] [place] window for TPA.  He was admitted to telemetry. He had an MRI, which confirmed acute left pontine cerebrovascular accident. His Xarelto is being restarted back, seen by neurology.  He will need intense physical therapy, occupational therapy and speech therapy.  They had a swallow evaluation done in the hospital. They recommended dysphagia diet with nectar thick liquids at this time. However, will need a follow up swallow evaluation to see if his diet can be upgraded.  No further aspirin as he is already started back on Xarelto, no further interruption to Xarelto at this time. Statin has been added, though he is elevated on at 77.  Carotid Dopplers with no significant stenosis. The patient  will be transferred to acute inpatient rehab.  2.  Right-sided pneumonia, possible aspiration after he started eating with his stroke symptoms at home, not sure consolidation noted in the right upper lobe.  The patient denies any further symptoms of pneumonia.  CT was done to rule out any underlying mass, possible  interstitial pneumonitis versus fibrosis at the basilar lobes.  He is started on Levaquin at this time. Repeat CT as an outpatient in 4 to 6 weeks to ensure resolution and make sure no underlying masses are present. Findings explained to the family at bedside.  3.  Hypertension on Losartan is being continued.  His blood pressure has remained stable without any blood pressure medications here in the hospital.   permissive intracranial hypertension was allowed during this time to improve perfusion after acute CVA.  4.  His course has been otherwise uneventful in the hospital.   DISCHARGE CONDITION: Stable.   DISCHARGE DISPOSITION: To [facility] inpatient rehab.   TIME SPENT ON DISCHARGE:  40 minutes.    ____________________________ Gladstone Lighter, MD rk:DT D: 03/17/2015 10:18:31 ET T: 03/17/2015 10:42:42 ET JOB#: [id number]  cc: Gladstone Lighter, MD, <Dictator> Lavera Guise, MD  Gladstone Lighter MD ELECTRONICALLY SIGNED 03/18/2015 11:01

## 2015-04-11 ENCOUNTER — Encounter (HOSPITAL_COMMUNITY): Payer: Self-pay

## 2015-04-11 ENCOUNTER — Ambulatory Visit (HOSPITAL_COMMUNITY): Payer: Medicare Other | Attending: Internal Medicine | Admitting: Physical Therapy

## 2015-04-11 ENCOUNTER — Ambulatory Visit (HOSPITAL_COMMUNITY): Payer: Medicare Other

## 2015-04-11 DIAGNOSIS — R29898 Other symptoms and signs involving the musculoskeletal system: Secondary | ICD-10-CM

## 2015-04-11 DIAGNOSIS — I639 Cerebral infarction, unspecified: Secondary | ICD-10-CM

## 2015-04-11 DIAGNOSIS — R278 Other lack of coordination: Secondary | ICD-10-CM | POA: Diagnosis not present

## 2015-04-11 DIAGNOSIS — R2689 Other abnormalities of gait and mobility: Secondary | ICD-10-CM

## 2015-04-11 DIAGNOSIS — I69351 Hemiplegia and hemiparesis following cerebral infarction affecting right dominant side: Secondary | ICD-10-CM | POA: Insufficient documentation

## 2015-04-11 DIAGNOSIS — R279 Unspecified lack of coordination: Secondary | ICD-10-CM

## 2015-04-11 NOTE — Therapy (Signed)
[place], [place], [address] Phone: [phone]   Fax:  [phone]  Occupational Therapy Treatment  Patient Details  Name: Curtis Freeman MRN: [id number] Date of Birth: Sep 26, 1941 Referring Provider:  Lavera Guise, MD  Encounter Date: 04/11/2015      OT End of Session - 04/11/15 1509    Visit Number 2   Number of Visits 24   Date for OT Re-Evaluation 06/06/15  mini reassessment 05/05/2015   Authorization Type Medicare A & B   Authorization Time Period Before 10th visit    Authorization - Visit Number 2   Authorization - Number of Visits 10   OT Start Time 1345   OT Stop Time 1430   OT Time Calculation (min) 45 min   Activity Tolerance Patient tolerated treatment well   Behavior During Therapy [facility] for tasks assessed/performed      History reviewed. No pertinent past medical history.  No past surgical history on file.  There were no vitals filed for this visit.  Visit Diagnosis:  Decreased coordination  Decreased grip strength of right hand  Decreased pinch strength      Subjective Assessment - 04/11/15 1439    Subjective  S: Pt stated "My coordination isn't very good."   Currently in Pain? No/denies            [facility] OT Assessment - 04/11/15 1440    Assessment   Diagnosis Left pontine stroke   Onset Date 03/17/15   Precautions   Precautions None                  OT Treatments/Exercises (OP) - 04/11/15 1441    Exercises   Exercises Hand   Additional Wrist Exercises   Theraputty Flatten;Roll;Grip;Pinch   Theraputty - Flatten seated; Red   Theraputty - Roll seated; Red   Theraputty - Grip seated, Red; pronated and supinated   Theraputty - Pinch seated; Red; 3 point and lateral pinch   Hand Gripper with Large Beads 35# resistance   Hand Gripper with Medium Beads 35# resistance   Hand Gripper with Small Beads 35# resistance   Hand Exercises   Other Hand Exercises Pt used  right hand to place red resistive clothespins on horizontal rod using 3 point pinch and removed clothespins using lateral pinch.   Fine Motor Coordination   Fine Motor Coordination Grooved pegs   Grooved pegs Pt used right hand to rotate and place pegs into pegboard.  Pt dropped 3-5 pegs.   Other Fine Motor Exercises Pt completed "Perfection" game 2X using right hand to pick up pieces and place onto game board.              OT Short Term Goals - 04/11/15 1512    OT SHORT TERM GOAL #1   Title Pt will be educated on HEP.    Time 4   Period Weeks   Status On-going   OT SHORT TERM GOAL #2   Title Pt will increase right grip strength by 10# to increase ability to hold razor and complete shaving tasks.    Time 4   Period Weeks   Status On-going   OT SHORT TERM GOAL #3   Title Pt will increase right pinch strength by 5# to increase ability to hold onto utensils when eating.    Time 4   Period Weeks   Status On-going   OT SHORT TERM GOAL #4   Title Pt will  improve coordination by completing 9 hole peg test in under 40" with right hand.    Time 4   Period Weeks   Status On-going           OT Long Term Goals - 04/11/15 1513    OT LONG TERM GOAL #1   Title Pt will return to prior level of functioning and independence in all B/IADL tasks.    Time 8   Period Weeks   Status On-going   OT LONG TERM GOAL #2   Title Pt will increase right grip strength by 15# to increase ability to hold onto tools and complete yard work tasks.    Time 8   Period Weeks   Status On-going   OT LONG TERM GOAL #3   Title Pt will increase right pinch strength by 8# to increase ability to complete writing tasks legibly.    Time 8   Period Weeks   Status On-going   OT LONG TERM GOAL #4   Title Pt will increase coordination by completing 9 hole peg test in under 30" using right hand.    Time 8   Period Weeks   Status On-going               Plan - 04/11/15 1458    Clinical Impression  Statement A:  Pt was given copy of his Evaluation at end of session.  Pt attempted green clothespins but was unable to tolerate so resumed exercise using red clothespins.  Pt able to perform grooved pegs exercise, Pt dropped 3-5 pegs throughout exercise when rotating pegs in fingers in order to place in board.  Pt able to perform putty exercise with red theraputty consisting of flatten, roll, pronated/supinated grip, 3 point/lateral pinch expressing slight discomfort durng grip portion of exercise.  Pt able to complete Perfection game accurately 2X.  Pt completed hand gripper exercise consisting of large, medium, and small beads demonstrating slight difficulty at first, but was able to perform.  Pt tolerated exercises well.   Plan P:  Increase hand gripper resistance if tolerable.  Pick up sponges and blocks using red resistive clothespin (green if tolerable.)        Problem List There are no active problems to display for this patient.   Elba Barman, OTA Student [phone]  04/11/2015, 3:13 PM  [place] [address] [place], [place], [address] Phone: [phone]   Fax:  [phone]

## 2015-04-11 NOTE — Therapy (Signed)
[place], [place], [address] Phone: [phone]   Fax:  [phone]  Physical Therapy Treatment  Patient Details  Name: ISSAIC WELLIVER MRN: [id number] Date of Birth: 1940/12/25 Referring Provider:  Lavera Guise, MD  Encounter Date: 04/11/2015      PT End of Session - 04/11/15 1340    Visit Number 2   Number of Visits 8   Date for PT Re-Evaluation 05/07/15   Authorization Type medicare   PT Start Time 1300   PT Stop Time 1354   PT Time Calculation (min) 54 min   Equipment Utilized During Treatment Gait belt   Activity Tolerance Patient tolerated treatment well      No past medical history on file.  No past surgical history on file.  There were no vitals filed for this visit.  Visit Diagnosis:  Decreased coordination  CVA (cerebral vascular accident)  Weakness of right leg  Unstable balance      Subjective Assessment - 04/11/15 1258    Subjective Pt has been completing HEP    Currently in Pain? No/denies                 Balance Exercises - 04/11/15 1325    Balance Exercises: Standing   Tandem Stance Eyes closed;2 reps   SLS with Vectors Solid surface;2 reps;10 secs   Standing, One Foot on a Step Head turns;8 inch;30 secs   Wall Bumps Shoulder;Hip   Wall Bumps-Shoulders Eyes opened;10 reps   Wall Bumps-Hips Eyes opened;10 reps   Rockerboard Anterior/posterior;Lateral;Other time (comment)  2 minutes    Balance Beam retro and sidesteps x 2 RT    Retro Gait 2 reps   Step Over Hurdles / [place] 2 RT / sidestepping x 2  RT    Sit to Stand Time --   Balance Exercises: Seated   Other Seated Exercises nustep L4 x 10'             PT Short Term Goals - 04/11/15 1344    PT SHORT TERM GOAL #1   Title I in Hep   Time 2   Period Weeks   Status Achieved   PT SHORT TERM GOAL #2   Title Pt to be able to SLS for 10 seconds B to reduce risk of falls    Time 2   Period Weeks   Status On-going   PT SHORT TERM GOAL #3   Title Pt to be walking in the house without an assistive device   Time 2   Period Weeks   Status Achieved   PT SHORT TERM GOAL #4   Title Pt to be able to stand for 15 minutes without fatige to take a shower   Time 2   Period Weeks   Status On-going           PT Long Term Goals - 04/11/15 1344    PT LONG TERM GOAL #1   Title I in advance HEP   Time 4   Period Weeks   Status On-going   PT LONG TERM GOAL #2   Title Pt to be walking without a cane in and outside the home   Time 4   Period Weeks   Status Partially Met   PT LONG TERM GOAL #3   Title Pt to be walking for at least 30 mintues at lease 4x a week for better health habits    Time 4   Period  Weeks   Status On-going   PT LONG TERM GOAL #4   Title Pt strength to be wnl    Time 4   Period Weeks   Status On-going   PT LONG TERM GOAL #5   Title Pt to beworking out in the yard for two hours withotu fatigue    Time 4   Period Weeks   Status On-going               Plan - 04/11/15 1340    Clinical Impression Statement Added balance exercises to pt program with noted challenge.  Pt needed periodic short rest breaks.  All exercises were facilitated by therapist for safety.    PT Next Visit Plan begin sit to stand and stair climibing activity progress to yoga poses, lunge and sumo walking as able.         Problem List There are no active problems to display for this patient.  Rayetta Humphrey, PT CLT [phone]  04/11/2015, 1:46 PM  [facility] [address] [place], [place], [address] Phone: [phone]   Fax:  [phone]

## 2015-04-13 ENCOUNTER — Ambulatory Visit (HOSPITAL_COMMUNITY): Payer: Medicare Other | Admitting: Occupational Therapy

## 2015-04-13 ENCOUNTER — Encounter (HOSPITAL_COMMUNITY): Payer: Self-pay | Admitting: Occupational Therapy

## 2015-04-13 ENCOUNTER — Ambulatory Visit (HOSPITAL_COMMUNITY): Payer: Medicare Other | Admitting: Physical Therapy

## 2015-04-13 DIAGNOSIS — R29898 Other symptoms and signs involving the musculoskeletal system: Secondary | ICD-10-CM

## 2015-04-13 DIAGNOSIS — I639 Cerebral infarction, unspecified: Secondary | ICD-10-CM

## 2015-04-13 DIAGNOSIS — R278 Other lack of coordination: Secondary | ICD-10-CM

## 2015-04-13 DIAGNOSIS — I69351 Hemiplegia and hemiparesis following cerebral infarction affecting right dominant side: Secondary | ICD-10-CM | POA: Diagnosis not present

## 2015-04-13 DIAGNOSIS — R279 Unspecified lack of coordination: Principal | ICD-10-CM

## 2015-04-13 DIAGNOSIS — R2689 Other abnormalities of gait and mobility: Secondary | ICD-10-CM

## 2015-04-13 NOTE — Therapy (Signed)
[place], [place], [address] Phone: [phone]   Fax:  [phone]  Physical Therapy Treatment  Patient Details  Name: Curtis Freeman MRN: [id number] Date of Birth: [birth year] Referring Provider:  Lavera Guise, MD  Encounter Date: 04/13/2015      PT End of Session - 04/13/15 1153    Visit Number 3   Number of Visits 8   Date for PT Re-Evaluation 05/07/15   Authorization Type medicare   Authorization - Visit Number 3   Authorization - Number of Visits 10   PT Start Time 1104   PT Stop Time 1147   PT Time Calculation (min) 43 min   Equipment Utilized During Treatment Gait belt   Activity Tolerance Patient tolerated treatment well   Behavior During Therapy [facility] for tasks assessed/performed      No past medical history on file.  No past surgical history on file.  There were no vitals filed for this visit.  Visit Diagnosis:  Decreased coordination  CVA (cerebral vascular accident)  Weakness of right leg  Unstable balance      Subjective Assessment - 04/13/15 1151    Subjective Pt states he has been working on his tandem stance at home.  States he is having no pain today.   Currently in Pain? No/denies                     Balance Exercises - 04/13/15 1126    Balance Exercises: Standing   Tandem Stance 30 secs;2 reps   SLS with Vectors Solid surface;2 reps;10 secs  with 1 HHA   Standing, One Foot on a Step Head turns;8 inch;30 secs   Rockerboard Anterior/posterior;Lateral;Other time (comment)  2 minutes each with therapist stabilization   Balance Beam forward and retro x 2 RT  (4 sections of beam)   Sit to Stand Time 15 reps standard surface (neutral, Rt back, Lt back 5X each)   Other Standing Exercises fwd lunges 1 HHA onto 4" box   OTAGO PROGRAM   Stair Walking 4" step forward and lateral 1 HHA bilateral 10 reps each   Balance Exercises: Seated   Other Seated Exercises nustep  L4 x 10'             PT Short Term Goals - 04/11/15 1344    PT SHORT TERM GOAL #1   Title I in Hep   Time 2   Period Weeks   Status Achieved   PT SHORT TERM GOAL #2   Title Pt to be able to SLS for 10 seconds B to reduce risk of falls    Time 2   Period Weeks   Status On-going   PT SHORT TERM GOAL #3   Title Pt to be walking in the house without an assistive device   Time 2   Period Weeks   Status Achieved   PT SHORT TERM GOAL #4   Title Pt to be able to stand for 15 minutes without fatige to take a shower   Time 2   Period Weeks   Status On-going           PT Long Term Goals - 04/11/15 1344    PT LONG TERM GOAL #1   Title I in advance HEP   Time 4   Period Weeks   Status On-going   PT LONG TERM GOAL #2   Title Pt to be walking without a cane  in and outside the home   Time 4   Period Weeks   Status Partially Met   PT LONG TERM GOAL #3   Title Pt to be walking for at least 30 mintues at lease 4x a week for better health habits    Time 4   Period Weeks   Status On-going   PT LONG TERM GOAL #4   Title Pt strength to be wnl    Time 4   Period Weeks   Status On-going   PT LONG TERM GOAL #5   Title Pt to beworking out in the yard for two hours withotu fatigue    Time 4   Period Weeks   Status On-going               Plan - 04/13/15 1154    Clinical Impression Statement Progressed with sit to stand, step ups and squats to increase LE strength and stability.  Noted instability in LE's, Rt>Lt with vector stance having to use 1 UE in order to maintain balance greater than 3".  Pt with improved abiltiy with balance beam today and little difficulty completing sit to stands.     PT Next Visit Plan Progress sit to stand from lower surface, resume hurdles and add sumo walking to POC next visit.         Problem List There are no active problems to display for this patient.   Teena Irani, PTA/CLT [phone] 04/13/2015, 11:58 AM  [place], [place], [address] Phone: [phone]   Fax:  [phone]

## 2015-04-13 NOTE — Therapy (Signed)
[place], [place], [address] Phone: [phone]   Fax:  [phone]  Occupational Therapy Treatment  Patient Details  Name: Curtis Freeman MRN: [id number] Date of Birth: Aug 13, 1941 Referring Provider:  Lavera Guise, MD  Encounter Date: 04/13/2015      OT End of Session - 04/13/15 1209    Visit Number 3   Number of Visits 24   Date for OT Re-Evaluation 06/06/15  mini reassessment 05/05/2015   Authorization Type Medicare A & B   Authorization Time Period Before 10th visit    Authorization - Visit Number 3   Authorization - Number of Visits 10   OT Start Time 1015   OT Stop Time 1100   OT Time Calculation (min) 45 min   Activity Tolerance Patient tolerated treatment well   Behavior During Therapy [facility] for tasks assessed/performed      History reviewed. No pertinent past medical history.  No past surgical history on file.  There were no vitals filed for this visit.  Visit Diagnosis:  Decreased coordination  Decreased grip strength of right hand  Decreased pinch strength      Subjective Assessment - 04/13/15 1014    Subjective  S: I was able to get the pegs in the holes last time.    Currently in Pain? No/denies            [facility] OT Assessment - 04/13/15 1209    Assessment   Diagnosis Left pontine stroke   Precautions   Precautions None                  OT Treatments/Exercises (OP) - 04/13/15 1016    Exercises   Exercises Hand   Hand Exercises   Theraputty Flatten;Roll;Grip;Pinch   Theraputty - Flatten seated; Red   Theraputty - Roll seated; Red   Theraputty - Grip seated, Red; pronated and supinated   Theraputty - Pinch seated; Red; 3 point and lateral pinch   Theraputty - Locate Pegs 10 beads; mod difficulty   Hand Gripper with Large Beads 37# resistance   Hand Gripper with Medium Beads 35# resistance   Hand Gripper with Small Beads 5 beads with 35#, 12 beads with 29#  resistance   Sponges 15, 16   Other Hand Exercises Pt used right hand to pinch red resistive clothespin and pick up 20 high resistance sponges. Verbal cuing to depress shoulder. Pt had min-mod difficulty with task   Other Hand Exercises 15 repetitions of composite flex/ext with hand gripper set with blue rubber band.                                                                        OT Short Term Goals - 04/11/15 1512    OT SHORT TERM GOAL #1   Title Pt will be educated on HEP.    Time 4   Period Weeks   Status On-going   OT SHORT TERM GOAL #2   Title Pt will increase right grip strength by 10# to increase ability to hold razor and complete shaving tasks.    Time 4   Period Weeks   Status On-going   OT SHORT TERM GOAL #3  Title Pt will increase right pinch strength by 5# to increase ability to hold onto utensils when eating.    Time 4   Period Weeks   Status On-going   OT SHORT TERM GOAL #4   Title Pt will improve coordination by completing 9 hole peg test in under 40" with right hand.    Time 4   Period Weeks   Status On-going           OT Long Term Goals - 04/11/15 1513    OT LONG TERM GOAL #1   Title Pt will return to prior level of functioning and independence in all B/IADL tasks.    Time 8   Period Weeks   Status On-going   OT LONG TERM GOAL #2   Title Pt will increase right grip strength by 15# to increase ability to hold onto tools and complete yard work tasks.    Time 8   Period Weeks   Status On-going   OT LONG TERM GOAL #3   Title Pt will increase right pinch strength by 8# to increase ability to complete writing tasks legibly.    Time 8   Period Weeks   Status On-going   OT LONG TERM GOAL #4   Title Pt will increase coordination by completing 9 hole peg test in under 30" using right hand.    Time 8   Period Weeks   Status On-going               Plan - 04/13/15 1210    Clinical Impression Statement A: Increased  hand gripper to 37# for large beads, remained at 35# for medium and part of small beads, 29# for majority of small beads. Pt was able to pick up high resistance sponges using 3 point pinch with red resistive clothespin, unable to pinch green. Continued theraputty, added beads. Pt tolerated treatment well & reports he thinks he can see an improvement in coordination.    Plan P: Attempt small beads first with gripper at 35#. Add nuts & bolts.         Problem List There are no active problems to display for this patient.   Guadelupe Sabin, OTR/L  [phone]  04/13/2015, 12:12 PM  [place] [address] [place], [place], [address] Phone: [phone]   Fax:  [phone]

## 2015-04-17 ENCOUNTER — Ambulatory Visit (HOSPITAL_COMMUNITY): Payer: Medicare Other | Admitting: Speech Pathology

## 2015-04-17 ENCOUNTER — Ambulatory Visit (HOSPITAL_COMMUNITY): Payer: Medicare Other | Admitting: Occupational Therapy

## 2015-04-17 DIAGNOSIS — I69351 Hemiplegia and hemiparesis following cerebral infarction affecting right dominant side: Secondary | ICD-10-CM | POA: Diagnosis not present

## 2015-04-17 DIAGNOSIS — R278 Other lack of coordination: Secondary | ICD-10-CM

## 2015-04-17 DIAGNOSIS — R279 Unspecified lack of coordination: Principal | ICD-10-CM

## 2015-04-17 DIAGNOSIS — R29898 Other symptoms and signs involving the musculoskeletal system: Secondary | ICD-10-CM

## 2015-04-17 DIAGNOSIS — R471 Dysarthria and anarthria: Secondary | ICD-10-CM

## 2015-04-17 NOTE — Therapy (Signed)
[place], [place], [address] Phone: [phone]   Fax:  [phone]  Speech Language Pathology Evaluation  Patient Details  Name: Curtis Freeman MRN: [id number] Date of Birth: [birth year] Referring Provider:  Lavera Guise, MD  Encounter Date: 04/17/2015      End of Session - 04/17/15 1625    Visit Number 1   Number of Visits 1   Authorization Type Medicare    SLP Start Time 1430   SLP Stop Time  8416   SLP Time Calculation (min) 45 min   Activity Tolerance Patient tolerated treatment well      No past medical history on file.  No past surgical history on file.  There were no vitals filed for this visit.  Visit Diagnosis: Dysarthria          SLP Evaluation [facility] - 04/17/15 1617    SLP Visit Information   SLP Received On 04/17/15   Onset Date April 2016   Medical Diagnosis s/p CVA   Pain Assessment   Currently in Pain? No/denies   Prior Functional Status   Cognitive/Linguistic Baseline Within functional limits   Type of Home House    Lives With Spouse   Available Support Family   Vocation Retired   Associate Professor   Overall Cognitive Status Impaired/Different from baseline   Area of Impairment Orientation   Orientation Level Time  off by one day/date   General Comments He states that wife "keeps up with that" for him   Memory Appears intact   Awareness Appears intact   Problem Solving Appears intact   Behaviors Lability   Auditory Comprehension   Overall Auditory Comprehension Appears within functional limits for tasks assessed   Yes/No Questions Within Functional Limits   Commands Within Functional Limits   Conversation Complex   Visual Recognition/Discrimination   Discrimination Within Function Limits   Reading Comprehension   Reading Status Not tested  pt reading murder mystery novel at home [facility]   Expression   Primary Mode of Expression Verbal   Verbal Expression   Overall Verbal  Expression Appears within functional limits for tasks assessed   Initiation No impairment   Level of Generative/Spontaneous Verbalization Conversation   Repetition No impairment   Naming No impairment   Pragmatics No impairment   Non-Verbal Means of Communication Not applicable   Written Expression   Dominant Hand Right   Written Expression Exceptions to [facility]   Overall Writen Expression decreased fine motor with right hand   Oral Motor/Sensory Function   Overall Oral Motor/Sensory Function Impaired   Labial ROM Within Functional Limits   Labial Strength Within Functional Limits   Labial Sensation Within Functional Limits   Labial Coordination WFL   Lingual ROM Within Functional Limits   Lingual Symmetry Within Functional Limits   Lingual Strength Within Functional Limits   Lingual Sensation Within Functional Limits   Lingual Coordination Reduced   Facial ROM Within Functional Limits   Facial Symmetry Within Functional Limits   Facial Strength Within Functional Limits   Facial Sensation Within Functional Limits   Facial Coordination WFL   Velum Within Functional Limits   Mandible Within Functional Limits   Overall Oral Motor/Sensory Function --  decreased lingual coordingation for rapid alternating moveme   Motor Speech   Overall Motor Speech Impaired  mild decreased naturalness of speech   Respiration Within functional limits   Phonation Normal   Resonance Within functional limits   Articulation  Within functional limitis   Intelligibility Intelligible   Motor Planning Witnin functional limits   Motor Speech Errors Not applicable   Effective Techniques Slow rate   Phonation WFL   Standardized Assessments   Standardized Assessments  Montreal Cognitive Assessment (MOCA)   Montreal Cognitive Assessment (MOCA)  27/30             Plan - 03-May-2015 1626    Clinical Impression Statement Curtis Freeman achieved 27/30 with errors in orientation (off by one), attention  (repetition of #s in reverse, and generational naming (10 "f" words in 1 minute) on the [place]. This score falls within normal limits. Curtis Freeman endorses "typical forgetfulness" for age and states that his wife is the one to keep track of appointments. He feels that his cognitive linguistic skills have returned to baseline with the exception of mild dysarthria. His wife is in agreement. Pt independently compensates for mild dysarthria by reducing his rate of speech. Pt was does not feel that he needs SLP intervention at this time, but was appreciative for the memory strategies provided via handout. No further SLP services required at this time, however pt/wife were encouraged to monitor for any changes they may note at home and return to therapy if indicated.    Consulted and Agree with Plan of Care Patient;Family member/caregiver   Family Member Consulted Wife          G-Codes - 05-03-15 1627-03-06    Functional Assessment Tool Used MOCA and other informal measures   Functional Limitations Motor speech   Motor Speech Current Status [phone]) At least 1 percent but less than 20 percent impaired, limited or restricted   Motor Speech Goal Status (U0454) At least 1 percent but less than 20 percent impaired, limited or restricted   Motor Speech Goal Status (U9811) At least 1 percent but less than 20 percent impaired, limited or restricted      Problem List There are no active problems to display for this patient.  Thank you,  Genene Churn, [place]  [facility] 03-May-2015, 4:29 PM  [place] [address] [place], [place], [address] Phone: [phone]   Fax:  [phone]

## 2015-04-17 NOTE — Therapy (Signed)
[place], [place], [address] Phone: [phone]   Fax:  [phone]  Occupational Therapy Treatment  Patient Details  Name: GERALDINE TESAR MRN: [id number] Date of Birth: 1941-09-19 Referring Provider:  Lavera Guise, MD  Encounter Date: 04/17/2015      OT End of Session - 04/17/15 1607    Visit Number 4   Number of Visits 24   Date for OT Re-Evaluation 06/06/15  mini reassessment 05/05/2015   Authorization Type Medicare A & B   Authorization Time Period Before 10th visit    Authorization - Visit Number 4   Authorization - Number of Visits 10   OT Start Time 1515   OT Stop Time 1600   OT Time Calculation (min) 45 min   Activity Tolerance Patient tolerated treatment well   Behavior During Therapy [facility] for tasks assessed/performed      No past medical history on file.  No past surgical history on file.  There were no vitals filed for this visit.  Visit Diagnosis:  Decreased coordination  Decreased grip strength of right hand  Decreased pinch strength          [facility] OT Assessment - 04/17/15 1607    Assessment   Diagnosis Left pontine stroke   Precautions   Precautions None           OT Treatments/Exercises (OP) - 04/17/15 1519    Exercises   Exercises Hand   Hand Exercises   Theraputty Flatten;Roll;Grip;Pinch   Theraputty - Flatten seated; Red   Theraputty - Roll seated; Red   Theraputty - Grip seated, Red; pronated and supinated   Theraputty - Pinch seated; Red; 3 point and lateral pinch; all digits   Hand Gripper with Large Beads 37# 6/6 beads resistance   Hand Gripper with Medium Beads 35# 12/12 beads   Hand Gripper with Small Beads 35# 17/17 beads   Sponges 19, 13   Other Hand Exercises Pt used white PVC piping to push into theraputty "making donuts" using RUE. Pt had mod difficulty pushing pipe into putty.    Fine Motor Coordination   Fine Motor Coordination Nuts and Bolts   Nuts and Bolts Pt unscrewed and screwed nuts onto bolts, 4 sizes, 2 of each size. Pt had min difficulty with 2 largest nuts, mod difficulty with 2 smaller nuts. Pt required short, intermittent rest breaks.             OT Short Term Goals - 04/11/15 1512    OT SHORT TERM GOAL #1   Title Pt will be educated on HEP.    Time 4   Period Weeks   Status On-going   OT SHORT TERM GOAL #2   Title Pt will increase right grip strength by 10# to increase ability to hold razor and complete shaving tasks.    Time 4   Period Weeks   Status On-going   OT SHORT TERM GOAL #3   Title Pt will increase right pinch strength by 5# to increase ability to hold onto utensils when eating.    Time 4   Period Weeks   Status On-going   OT SHORT TERM GOAL #4   Title Pt will improve coordination by completing 9 hole peg test in under 40" with right hand.    Time 4   Period Weeks   Status On-going           OT Long Term Goals - 04/11/15 1513  OT LONG TERM GOAL #1   Title Pt will return to prior level of functioning and independence in all B/IADL tasks.    Time 8   Period Weeks   Status On-going   OT LONG TERM GOAL #2   Title Pt will increase right grip strength by 15# to increase ability to hold onto tools and complete yard work tasks.    Time 8   Period Weeks   Status On-going   OT LONG TERM GOAL #3   Title Pt will increase right pinch strength by 8# to increase ability to complete writing tasks legibly.    Time 8   Period Weeks   Status On-going   OT LONG TERM GOAL #4   Title Pt will increase coordination by completing 9 hole peg test in under 30" using right hand.    Time 8   Period Weeks   Status On-going               Plan - 04/17/15 1607    Clinical Impression Statement A: Pt completed small beads with hand gripper set to 35# with mod difficulty. Pt completed nuts/bolts activity with min-mod difficulty. Pt reports he is continuing to complete his HEP daily. Pt tolerated  treatment well.    Plan P: Add in-hand manipulation transfering pennies from palm to slotted container. Attempt to increase hand gripper to 38# with large beads. Continue grip/pinch strengthening.         Problem List There are no active problems to display for this patient.   Guadelupe Sabin, OTR/L  [phone]  04/17/2015, 4:11 PM  [place] [address] [place], [place], [address] Phone: [phone]   Fax:  [phone]

## 2015-04-18 ENCOUNTER — Ambulatory Visit (HOSPITAL_COMMUNITY): Payer: Medicare Other | Admitting: Physical Therapy

## 2015-04-18 DIAGNOSIS — R2689 Other abnormalities of gait and mobility: Secondary | ICD-10-CM

## 2015-04-18 DIAGNOSIS — I639 Cerebral infarction, unspecified: Secondary | ICD-10-CM

## 2015-04-18 DIAGNOSIS — R29898 Other symptoms and signs involving the musculoskeletal system: Secondary | ICD-10-CM

## 2015-04-18 DIAGNOSIS — I69351 Hemiplegia and hemiparesis following cerebral infarction affecting right dominant side: Secondary | ICD-10-CM | POA: Diagnosis not present

## 2015-04-18 NOTE — Therapy (Signed)
[place], [place], [address] Phone: [phone]   Fax:  [phone]  Physical Therapy Treatment  Patient Details  Name: Curtis Freeman MRN: [id number] Date of Birth: 16-Jul-1941 Referring Provider:  Lavera Guise, MD  Encounter Date: 04/18/2015      PT End of Session - 04/18/15 1430    Visit Number 4   Number of Visits 8   Date for PT Re-Evaluation 05/07/15   Authorization Type medicare   Authorization - Visit Number 4   Authorization - Number of Visits 10   PT Start Time 1517   PT Stop Time 1429   PT Time Calculation (min) 40 min   Equipment Utilized During Treatment Gait belt   Activity Tolerance Patient tolerated treatment well      No past medical history on file.  No past surgical history on file.  There were no vitals filed for this visit.  Visit Diagnosis:  CVA (cerebral vascular accident)  Weakness of right leg  Unstable balance      Subjective Assessment - 04/18/15 1408    Subjective Pt has no complaints doing HEP    Currently in Pain? No/denies             Balance Exercises - 04/18/15 1409    Balance Exercises: Standing   Tandem Stance Eyes closed;Foam/compliant surface;30 secs   SLS with Vectors Solid surface;3 reps;10 secs   Rockerboard Anterior/posterior;Lateral;Other time (comment)  2 minutes each   Balance Beam with hurdles side step and tandem    Marching Limitations on foam x 10    Other Standing Exercises fwd lunges  x 10 no hand hold Both LE; wall squat x 10             PT Short Term Goals - 04/11/15 1344    PT SHORT TERM GOAL #1   Title I in Hep   Time 2   Period Weeks   Status Achieved   PT SHORT TERM GOAL #2   Title Pt to be able to SLS for 10 seconds B to reduce risk of falls    Time 2   Period Weeks   Status On-going   PT SHORT TERM GOAL #3   Title Pt to be walking in the house without an assistive device   Time 2   Period Weeks   Status  Achieved   PT SHORT TERM GOAL #4   Title Pt to be able to stand for 15 minutes without fatige to take a shower   Time 2   Period Weeks   Status On-going           PT Long Term Goals - 04/11/15 1344    PT LONG TERM GOAL #1   Title I in advance HEP   Time 4   Period Weeks   Status On-going   PT LONG TERM GOAL #2   Title Pt to be walking without a cane in and outside the home   Time 4   Period Weeks   Status Partially Met   PT LONG TERM GOAL #3   Title Pt to be walking for at least 30 mintues at lease 4x a week for better health habits    Time 4   Period Weeks   Status On-going   PT LONG TERM GOAL #4   Title Pt strength to be wnl    Time 4   Period Weeks   Status On-going  PT LONG TERM GOAL #5   Title Pt to beworking out in the yard for two hours withotu fatigue    Time 4   Period Weeks   Status On-going               Plan - 04/18/15 1431    Clinical Impression Statement added hurdles with foam and foam marching with significant imbalances noted.  Pt able to self correct 3/4 of the time with minimum assist needed from therapist 1/4 of the time.  All exercises were facilitated by therapist for pt safety.    PT Next Visit Plan add sumo walking as well as foam cone rotation        Problem List There are no active problems to display for this patient.  Rayetta Humphrey, PT CLT [phone] 04/18/2015, 2:33 PM  [place] [address] [place], [place], [address] Phone: [phone]   Fax:  [phone]

## 2015-04-20 ENCOUNTER — Ambulatory Visit (HOSPITAL_COMMUNITY): Payer: Medicare Other

## 2015-04-20 ENCOUNTER — Ambulatory Visit (HOSPITAL_COMMUNITY): Payer: Medicare Other | Admitting: Physical Therapy

## 2015-04-20 ENCOUNTER — Encounter (HOSPITAL_COMMUNITY): Payer: Self-pay

## 2015-04-20 DIAGNOSIS — R29898 Other symptoms and signs involving the musculoskeletal system: Secondary | ICD-10-CM

## 2015-04-20 DIAGNOSIS — R278 Other lack of coordination: Secondary | ICD-10-CM

## 2015-04-20 DIAGNOSIS — R279 Unspecified lack of coordination: Secondary | ICD-10-CM

## 2015-04-20 DIAGNOSIS — R2689 Other abnormalities of gait and mobility: Secondary | ICD-10-CM

## 2015-04-20 DIAGNOSIS — I639 Cerebral infarction, unspecified: Secondary | ICD-10-CM

## 2015-04-20 DIAGNOSIS — R471 Dysarthria and anarthria: Secondary | ICD-10-CM

## 2015-04-20 DIAGNOSIS — I69351 Hemiplegia and hemiparesis following cerebral infarction affecting right dominant side: Secondary | ICD-10-CM | POA: Diagnosis not present

## 2015-04-20 NOTE — Therapy (Signed)
[place], [place], [address] Phone: [phone]   Fax:  [phone]  Physical Therapy Treatment  Patient Details  Name: Curtis Freeman MRN: [id number] Date of Birth: August 24, 1941 Referring Provider:  Lavera Guise, MD  Encounter Date: 04/20/2015      PT End of Session - 04/20/15 1559    Visit Number 5   Number of Visits 8   Date for PT Re-Evaluation 05/07/15   Authorization Type medicare   Authorization - Visit Number 5   Authorization - Number of Visits 10   PT Start Time 7371   PT Stop Time 1605   PT Time Calculation (min) 50 min   Equipment Utilized During Treatment Gait belt   Activity Tolerance Patient tolerated treatment well      No past medical history on file.  No past surgical history on file.  There were no vitals filed for this visit.  Visit Diagnosis:  CVA (cerebral vascular accident)  Weakness of right leg  Unstable balance  Decreased coordination  Decreased grip strength of right hand  Decreased pinch strength  Dysarthria      Subjective Assessment - 04/20/15 1517    Subjective Pt reports he got a good workout in OT today.  Currently without pain.                   Balance Exercises - 04/20/15 1520    Balance Exercises: Standing   SLS with Vectors Solid surface;3 reps;10 secs   Rockerboard Anterior/posterior;Lateral;Other time (comment)   Balance Beam with hurdles side step and tandem    Cone Rotation Foam/compliant surface;Right turn;Left turn   Cone Rotation Limitations high/low surfaces with reach 2RT   Marching Limitations on foam x 10 no UE's   Lift / Chop Limitations sumo walking red theraband 2RT   Other Standing Exercises fwd lunges  x 10 no hand hold Both LE; wall squat x 10    Balance Exercises: Seated   Other Seated Exercises nustep L4 hills #3 x 10' UE/LE              PT Short Term Goals - 04/11/15 1344    PT SHORT TERM GOAL #1   Title  I in Hep   Time 2   Period Weeks   Status Achieved   PT SHORT TERM GOAL #2   Title Pt to be able to SLS for 10 seconds B to reduce risk of falls    Time 2   Period Weeks   Status On-going   PT SHORT TERM GOAL #3   Title Pt to be walking in the house without an assistive device   Time 2   Period Weeks   Status Achieved   PT SHORT TERM GOAL #4   Title Pt to be able to stand for 15 minutes without fatige to take a shower   Time 2   Period Weeks   Status On-going           PT Long Term Goals - 04/11/15 1344    PT LONG TERM GOAL #1   Title I in advance HEP   Time 4   Period Weeks   Status On-going   PT LONG TERM GOAL #2   Title Pt to be walking without a cane in and outside the home   Time 4   Period Weeks   Status Partially Met   PT LONG TERM GOAL #3   Title Pt  to be walking for at least 30 mintues at lease 4x a week for better health habits    Time 4   Period Weeks   Status On-going   PT LONG TERM GOAL #4   Title Pt strength to be wnl    Time 4   Period Weeks   Status On-going   PT LONG TERM GOAL #5   Title Pt to beworking out in the yard for two hours withotu fatigue    Time 4   Period Weeks   Status On-going               Plan - 04/20/15 1600    Clinical Impression Statement Pt wtih improved stability completing hurdles on foam and with marching on foam both without UE assist.  Added sumo walking with RTB and cone rotations on foam with challenge noted.  PT required 3 seated rest breaks during session today.    PT Next Visit Plan continue to progress and challenge higher level balance as tolerated.           Teena Irani, PTA/CLT [phone] 04/20/2015, 4:03 PM  [place] [address] [place], [place], [address] Phone: [phone]   Fax:  [phone]

## 2015-04-20 NOTE — Therapy (Signed)
[place], [place], [address] Phone: [phone]   Fax:  [phone]  Occupational Therapy Treatment  Patient Details  Name: Curtis Freeman MRN: [id number] Date of Birth: Jan 27, 1941 Referring Provider:  Lavera Guise, MD  Encounter Date: 04/20/2015      OT End of Session - 04/20/15 1559    Visit Number 5   Number of Visits 24   Date for OT Re-Evaluation 06/06/15  mini reassessment 05/05/2015   Authorization Type Medicare A & B   Authorization Time Period Before 10th visit    Authorization - Visit Number 5   Authorization - Number of Visits 10   OT Start Time 1403   OT Stop Time 1448   OT Time Calculation (min) 45 min   Activity Tolerance Patient tolerated treatment well   Behavior During Therapy [facility] for tasks assessed/performed      History reviewed. No pertinent past medical history.  No past surgical history on file.  There were no vitals filed for this visit.  Visit Diagnosis:  Weakness of right leg  Decreased coordination  Decreased grip strength of right hand  Decreased pinch strength      Subjective Assessment - 04/20/15 1422    Subjective  S: I have difficulty picking up the pennies without nails.    Currently in Pain? No/denies            [facility] OT Assessment - 04/20/15 1423    Assessment   Diagnosis Left pontine stroke   Precautions   Precautions None                  OT Treatments/Exercises (OP) - 04/20/15 1423    Exercises   Exercises Hand   Hand Exercises   Hand Gripper with Large Beads 38# 6/6 beads   Hand Gripper with Medium Beads 38# 7/12 beads then 5 beads at 35#   Hand Gripper with Small Beads 35# 17/17 beads   Small Pegboard Pt used small grooved pegboard for [facility] task. Patient picked up 5 pegs at a time and placed in board. patient removed pegs from board with tweezers. No difficulty with removal and mod difficulty with placement.    Fine Motor  Coordination In hand manipuation training;Small Pegboard   In Hand Manipulation Training Pt used Rt hand to pick up 4 pennies from table; initially trying to pick up straight from the table but changed to sliding the penny off the table due to lack of fingernail. Pt then transfered pennies from palm of hand to finger tip to stack on table. Pt picked up two pennies one at time then placed pennies into bank slot.    Other Hand Exercises Pt began colored pegboard task using tweezers to pick up pegs and place in appropriate position on board. Pt did not finish due to time constraint.              Balance Exercises - 04/20/15 1520    Balance Exercises: Standing   SLS with Vectors Solid surface;3 reps;10 secs   Rockerboard Anterior/posterior;Lateral;Other time (comment)   Balance Beam with hurdles side step and tandem    [facility] Rotation Foam/compliant surface;Right turn;Left turn   [facility] Rotation Limitations high/low surfaces with reach 2RT   Marching Limitations on foam x 10 no UE's   Lift / Chop Limitations sumo walking red theraband 2RT   Other Standing Exercises fwd lunges  x 10 no hand hold Both LE; wall squat x  10    Balance Exercises: Seated   Other Seated Exercises nustep L4 hills #3 x 10' UE/LE              OT Short Term Goals - 04/11/15 1512    OT SHORT TERM GOAL #1   Title Pt will be educated on HEP.    Time 4   Period Weeks   Status On-going   OT SHORT TERM GOAL #2   Title Pt will increase right grip strength by 10# to increase ability to hold razor and complete shaving tasks.    Time 4   Period Weeks   Status On-going   OT SHORT TERM GOAL #3   Title Pt will increase right pinch strength by 5# to increase ability to hold onto utensils when eating.    Time 4   Period Weeks   Status On-going   OT SHORT TERM GOAL #4   Title Pt will improve coordination by completing 9 hole peg test in under 40" with right hand.    Time 4   Period Weeks   Status On-going            OT Long Term Goals - 04/11/15 1513    OT LONG TERM GOAL #1   Title Pt will return to prior level of functioning and independence in all B/IADL tasks.    Time 8   Period Weeks   Status On-going   OT LONG TERM GOAL #2   Title Pt will increase right grip strength by 15# to increase ability to hold onto tools and complete yard work tasks.    Time 8   Period Weeks   Status On-going   OT LONG TERM GOAL #3   Title Pt will increase right pinch strength by 8# to increase ability to complete writing tasks legibly.    Time 8   Period Weeks   Status On-going   OT LONG TERM GOAL #4   Title Pt will increase coordination by completing 9 hole peg test in under 30" using right hand.    Time 8   Period Weeks   Status On-going               Plan - 04/20/15 1559    Clinical Impression Statement A: Increased handgripper to 38# for large and most of medium beads (7/12). Patient needed vc's to rest hand periodically during tasks. vc's needed to use thumb to bring items from palm to fingertips versus shaking hand to have item fall to fingertip.   Plan P: Cont with colored pegboard task with tweezers.        Problem List There are no active problems to display for this patient.   Ailene Ravel, OTR/L,CBIS  [phone]  04/20/2015, 4:02 PM  [place] [address] [place], [place], [address] Phone: [phone]   Fax:  [phone]

## 2015-04-21 ENCOUNTER — Ambulatory Visit
Admission: RE | Admit: 2015-04-21 | Discharge: 2015-04-21 | Disposition: A | Discharge status: Home / Self Care | Payer: Medicare Other | Source: Ambulatory Visit | Attending: Physician Assistant | Admitting: Physician Assistant

## 2015-04-21 ENCOUNTER — Other Ambulatory Visit: Payer: Self-pay | Admitting: Physician Assistant

## 2015-04-21 DIAGNOSIS — R05 Cough: Secondary | ICD-10-CM

## 2015-04-21 DIAGNOSIS — R918 Other nonspecific abnormal finding of lung field: Secondary | ICD-10-CM | POA: Diagnosis not present

## 2015-04-21 DIAGNOSIS — R059 Cough, unspecified: Secondary | ICD-10-CM

## 2015-04-24 ENCOUNTER — Encounter (HOSPITAL_COMMUNITY): Payer: Self-pay | Admitting: Speech Pathology

## 2015-04-24 ENCOUNTER — Encounter (HOSPITAL_COMMUNITY): Payer: Self-pay | Admitting: Occupational Therapy

## 2015-04-24 ENCOUNTER — Ambulatory Visit (HOSPITAL_COMMUNITY): Payer: Medicare Other | Admitting: Occupational Therapy

## 2015-04-24 ENCOUNTER — Ambulatory Visit (HOSPITAL_COMMUNITY): Payer: Medicare Other | Admitting: Physical Therapy

## 2015-04-24 DIAGNOSIS — R29898 Other symptoms and signs involving the musculoskeletal system: Secondary | ICD-10-CM

## 2015-04-24 DIAGNOSIS — R278 Other lack of coordination: Secondary | ICD-10-CM

## 2015-04-24 DIAGNOSIS — R279 Unspecified lack of coordination: Principal | ICD-10-CM

## 2015-04-24 DIAGNOSIS — I69351 Hemiplegia and hemiparesis following cerebral infarction affecting right dominant side: Secondary | ICD-10-CM | POA: Diagnosis not present

## 2015-04-24 DIAGNOSIS — R2689 Other abnormalities of gait and mobility: Secondary | ICD-10-CM

## 2015-04-24 DIAGNOSIS — I639 Cerebral infarction, unspecified: Secondary | ICD-10-CM

## 2015-04-24 DIAGNOSIS — R471 Dysarthria and anarthria: Secondary | ICD-10-CM

## 2015-04-24 NOTE — Therapy (Signed)
[place], [place], [address] Phone: [phone]   Fax:  [phone]  Occupational Therapy Treatment  Patient Details  Name: Curtis Freeman MRN: [id number] Date of Birth: 1941-09-05 Referring Provider:  Lavera Guise, MD  Encounter Date: 04/24/2015      OT End of Session - 04/24/15 1615    Visit Number 6   Number of Visits 24   Date for OT Re-Evaluation 06/06/15  mini reassessment 05/05/2015   Authorization Type Medicare A & B   Authorization Time Period Before 10th visit    Authorization - Visit Number 6   Authorization - Number of Visits 10   OT Start Time 1515   OT Stop Time 1600   OT Time Calculation (min) 45 min   Activity Tolerance Patient tolerated treatment well   Behavior During Therapy [facility] for tasks assessed/performed      History reviewed. No pertinent past medical history.  No past surgical history on file.  There were no vitals filed for this visit.  Visit Diagnosis:  Decreased coordination  Decreased grip strength of right hand  Decreased pinch strength      Subjective Assessment - 04/24/15 1517    Subjective  S: I couldn't get that basket I was holding up the ladder, it was too heavy.    Currently in Pain? No/denies            [facility] OT Assessment - 04/24/15 1517    Assessment   Diagnosis Left pontine stroke   Precautions   Precautions None                  OT Treatments/Exercises (OP) - 04/24/15 1524    Exercises   Exercises Hand   Hand Exercises   Hand Gripper with Large Beads 38# 6/6 beads   Hand Gripper with Medium Beads 38# 12/12 beads   Hand Gripper with Small Beads 35# 17/17 beads   Sponges 18, 14    Small Pegboard Pt used tweezers to place colored pegs into small pegboard according to pattern. Pt had mod-max difficulty placing pegs in holes, alternating between prontated grasp and static tripod grasp during task. Pt required extended time and  intermittant rest breaks.      Fine Motor Coordination Small Pegboard   Fine Motor Coordination   Fine Motor Coordination In hand manipuation training   In Hand Manipulation Training Pt dealt cards from right hand using thumb and index finger to separate and deal cards into a pile. Pt practiced shuffling stack of cards using the riffle shuffle. Pt had min-mod difficulty with task.                                                              OT Short Term Goals - 04/11/15 1512    OT SHORT TERM GOAL #1   Title Pt will be educated on HEP.    Time 4   Period Weeks   Status On-going   OT SHORT TERM GOAL #2   Title Pt will increase right grip strength by 10# to increase ability to hold razor and complete shaving tasks.    Time 4   Period Weeks   Status On-going   OT SHORT TERM GOAL #3   Title  Pt will increase right pinch strength by 5# to increase ability to hold onto utensils when eating.    Time 4   Period Weeks   Status On-going   OT SHORT TERM GOAL #4   Title Pt will improve coordination by completing 9 hole peg test in under 40" with right hand.    Time 4   Period Weeks   Status On-going           OT Long Term Goals - 04/11/15 1513    OT LONG TERM GOAL #1   Title Pt will return to prior level of functioning and independence in all B/IADL tasks.    Time 8   Period Weeks   Status On-going   OT LONG TERM GOAL #2   Title Pt will increase right grip strength by 15# to increase ability to hold onto tools and complete yard work tasks.    Time 8   Period Weeks   Status On-going   OT LONG TERM GOAL #3   Title Pt will increase right pinch strength by 8# to increase ability to complete writing tasks legibly.    Time 8   Period Weeks   Status On-going   OT LONG TERM GOAL #4   Title Pt will increase coordination by completing 9 hole peg test in under 30" using right hand.    Time 8   Period Weeks   Status On-going               Plan -  04/24/15 1615    Clinical Impression Statement A: Added card activities, continued pegboard and tweezer activity, pt completed large and medium beads with gripper set to 38#. Pt required rest breaks intermittantly during the session; extended time required for pegbaord activity. Pt tolerated treatment well.    Plan P: Increase hand gripper to 37# for small beads. Cont fine motor coordination and grip/pinch strengthening activities.         Problem List There are no active problems to display for this patient.   Guadelupe Sabin, OTR/L  [phone]  04/24/2015, 4:18 PM  [facility] [address] [place], [place], [address] Phone: [phone]   Fax:  [phone]

## 2015-04-24 NOTE — Therapy (Signed)
[place], [place], [address] Phone: [phone]   Fax:  [phone]  Physical Therapy Treatment  Patient Details  Name: Curtis Freeman MRN: [id number] Date of Birth: 12-28-1940 Referring Provider:  Lavera Guise, MD  Encounter Date: 04/24/2015      PT End of Session - 04/24/15 1433    Visit Number 6   Number of Visits 8   Date for PT Re-Evaluation 05/07/15   Authorization Type medicare   Authorization - Visit Number 6   Authorization - Number of Visits 10   PT Start Time 3903   PT Stop Time 1427   PT Time Calculation (min) 42 min      No past medical history on file.  No past surgical history on file.  There were no vitals filed for this visit.  Visit Diagnosis:  Weakness of right leg  Decreased coordination  Decreased grip strength of right hand  Decreased pinch strength  CVA (cerebral vascular accident)  Unstable balance  Dysarthria      Subjective Assessment - 04/24/15 1433    Subjective Patient reports he is doing well, had a good weekend    Pertinent History COPD, HTN   Currently in Pain? No/denies                         [facility] Adult PT Treatment/Exercise - 04/24/15 0001    Knee/Hip Exercises: Stretches   Active Hamstring Stretch 3 reps;30 seconds   Active Hamstring Stretch Limitations 14 inch box, 3 way    Piriformis Stretch 2 reps;30 seconds   Piriformis Stretch Limitations seated   Gastroc Stretch 3 reps;30 seconds   Gastroc Stretch Limitations slantboard   Knee/Hip Exercises: Standing   Heel Raises 15 reps   Forward Lunges Both;1 set;10 reps   Forward Lunges Limitations 2 inch box    Side Lunges Both;1 set;10 reps   Side Lunges Limitations 2 inch box    Forward Step Up Both;1 set;10 reps   Forward Step Up Limitations 6 inch box    Other Standing Knee Exercises Sidestepping 2x58f             Balance Exercises - 04/24/15 1354    Balance  Exercises: Standing   Standing Eyes Closed Narrow base of support (BOS);3 reps;20 secs   Tandem Stance Eyes closed;3 reps;15 secs   SLS Eyes open;Solid surface;4 reps;Time  5 seconds each leg, 4 times    Rockerboard Anterior/posterior;Lateral;Other (comment)  x20 each way, no hands    Tandem Gait Forward;4 reps;Other (comment)  4x172f  Retro Gait 2 reps;Other (comment)  2x3011f [facility] Rotation Foam/compliant surface;Right turn;Left turn   [facility] Rotation Limitations with therapist              PT Short Term Goals - 04/11/15 1344    PT SHORT TERM GOAL #1   Title I in Hep   Time 2   Period Weeks   Status Achieved   PT SHORT TERM GOAL #2   Title Pt to be able to SLS for 10 seconds B to reduce risk of falls    Time 2   Period Weeks   Status On-going   PT SHORT TERM GOAL #3   Title Pt to be walking in the house without an assistive device   Time 2   Period Weeks   Status Achieved   PT SHORT TERM GOAL #4   Title  Pt to be able to stand for 15 minutes without fatige to take a shower   Time 2   Period Weeks   Status On-going           PT Long Term Goals - 04/11/15 1344    PT LONG TERM GOAL #1   Title I in advance HEP   Time 4   Period Weeks   Status On-going   PT LONG TERM GOAL #2   Title Pt to be walking without a cane in and outside the home   Time 4   Period Weeks   Status Partially Met   PT LONG TERM GOAL #3   Title Pt to be walking for at least 30 mintues at lease 4x a week for better health habits    Time 4   Period Weeks   Status On-going   PT LONG TERM GOAL #4   Title Pt strength to be wnl    Time 4   Period Weeks   Status On-going   PT LONG TERM GOAL #5   Title Pt to beworking out in the yard for two hours withotu fatigue    Time 4   Period Weeks   Status On-going               Plan - 04/24/15 1434    Clinical Impression Statement Introduced functional exercises today and continued to progress balance exercises today. Patient  tolerated all activities well. Demonstrates significant tightness in bilateral hips and Piriformis muscle groups, as well as difficulty with balance tasks on foam with eyes closed and tandem gait activities. Patient often became out of breath during session, stating that its because of his COPD; encouraged patient to grade activities and take rest breaks as he becomes out of breath, but patient refused rest today. Monitored O2, which was only 92-93% at worst on room air.    Pt will benefit from skilled therapeutic intervention in order to improve on the following deficits Decreased activity tolerance;Decreased balance;Decreased strength;Decreased coordination   Rehab Potential Good   PT Frequency 2x / week   PT Duration 4 weeks   PT Treatment/Interventions Gait training;Stair training;Functional mobility training;Therapeutic activities;Balance training;Therapeutic exercise;Patient/family education;Neuromuscular re-education   PT Next Visit Plan continue to progress and challenge higher level balance as tolerated.     PT Home Exercise Plan given   Consulted and Agree with Plan of Care Patient        Problem List There are no active problems to display for this patient.  Deniece Ree PT, DPT [phone]  [facility] [address] [place], [place], [address] Phone: [phone]   Fax:  [phone]

## 2015-04-25 ENCOUNTER — Ambulatory Visit
Admission: RE | Admit: 2015-04-25 | Discharge: 2015-04-25 | Disposition: A | Discharge status: Home / Self Care | Payer: Medicare Other | Source: Ambulatory Visit | Attending: Internal Medicine | Admitting: Internal Medicine

## 2015-04-25 DIAGNOSIS — I2510 Atherosclerotic heart disease of native coronary artery without angina pectoris: Secondary | ICD-10-CM | POA: Insufficient documentation

## 2015-04-25 DIAGNOSIS — R918 Other nonspecific abnormal finding of lung field: Secondary | ICD-10-CM | POA: Diagnosis present

## 2015-04-25 DIAGNOSIS — R05 Cough: Secondary | ICD-10-CM

## 2015-04-25 DIAGNOSIS — R059 Cough, unspecified: Secondary | ICD-10-CM

## 2015-04-25 HISTORY — DX: Essential (primary) hypertension: I10

## 2015-04-25 MED ORDER — IOHEXOL 300 MG/ML  SOLN
75.0000 mL | Freq: Once | INTRAMUSCULAR | Status: AC | PRN
  Administered 2015-04-25: 75 mL via INTRAVENOUS

## 2015-04-26 ENCOUNTER — Other Ambulatory Visit: Payer: Self-pay | Admitting: Internal Medicine

## 2015-04-26 ENCOUNTER — Encounter (HOSPITAL_COMMUNITY): Payer: Self-pay | Admitting: Speech Pathology

## 2015-04-26 DIAGNOSIS — R918 Other nonspecific abnormal finding of lung field: Secondary | ICD-10-CM

## 2015-04-27 ENCOUNTER — Ambulatory Visit (HOSPITAL_COMMUNITY): Payer: Medicare Other | Admitting: Specialist

## 2015-04-27 ENCOUNTER — Ambulatory Visit (HOSPITAL_COMMUNITY): Payer: Medicare Other | Admitting: Physical Therapy

## 2015-04-27 DIAGNOSIS — R279 Unspecified lack of coordination: Principal | ICD-10-CM

## 2015-04-27 DIAGNOSIS — R29898 Other symptoms and signs involving the musculoskeletal system: Secondary | ICD-10-CM

## 2015-04-27 DIAGNOSIS — R278 Other lack of coordination: Secondary | ICD-10-CM

## 2015-04-27 DIAGNOSIS — I639 Cerebral infarction, unspecified: Secondary | ICD-10-CM

## 2015-04-27 DIAGNOSIS — R2689 Other abnormalities of gait and mobility: Secondary | ICD-10-CM

## 2015-04-27 DIAGNOSIS — I69351 Hemiplegia and hemiparesis following cerebral infarction affecting right dominant side: Secondary | ICD-10-CM | POA: Diagnosis not present

## 2015-04-27 NOTE — Therapy (Signed)
[place], [place], [address] Phone: [phone]   Fax:  [phone]  Occupational Therapy Treatment  Patient Details  Name: Curtis Freeman MRN: [id number] Date of Birth: Mar 02, 1941 Referring Provider:  Lavera Guise, MD  Encounter Date: 04/27/2015      OT End of Session - 04/27/15 1443    Visit Number 7   Number of Visits 24   Date for OT Re-Evaluation 06/06/15  mini reassess on 05/05/15   Authorization Type Medicare A & B   Authorization Time Period Before 10th visit    Authorization - Visit Number 7   Authorization - Number of Visits 10   OT Start Time 1400   OT Stop Time 1439   OT Time Calculation (min) 39 min   Activity Tolerance Patient tolerated treatment well   Behavior During Therapy [facility] for tasks assessed/performed      Past Medical History  Diagnosis Date  . Hypertension     No past surgical history on file.  There were no vitals filed for this visit.  Visit Diagnosis:  Decreased coordination  Decreased grip strength of right hand      Subjective Assessment - 04/27/15 1403    Subjective  S:   My arm is getting functionally alot better.  I used to stick my fork in my nose alot, now I dont.   Currently in Pain? No/denies            [facility] OT Assessment - 04/27/15 1426    Assessment   Diagnosis Left pontine stroke   Precautions   Precautions None                  OT Treatments/Exercises (OP) - 04/27/15 0001    Exercises   Exercises Hand   Hand Exercises   Theraputty Flatten;Roll;Grip;Pinch   Theraputty - Flatten standing red   Theraputty - Roll seated red   Theraputty - Grip seated, Red; pronated and supinated   Theraputty - Locate Pegs 10 beads cc   Sponges 12,   Fine Motor Coordination Small Pegboard   In Hand Manipulation Training placed all pegs one by one in grooved pegboard and then removed 5 at a time translating one by one to palm and then back to  digits to place in bucket with min-mod difficutly   Other Hand Exercises patient stated he had difficulty maintaing constant pressure on needle nose pliers at home.  completed task in clinic to pick up 15 beads with pliers and place in box.  patient had min difficulty maintaing grasp on pliers - pliers at home had much longer handles and were heavier pliers.    Other Hand Exercises concentration exercise able to place 2 pieces in 10 seconds first attempt and 6 pieces the second attempt.  Placed all untimed with min difficulty   Fine Motor Coordination   Other Fine Motor Exercises completed pinch tree with max resist green, blue, and black clothespins from bucket to vertical pole.  Patient had increased difficulty maintaining pressure on clothespins the higher he had to reach.               Balance Exercises - 04/27/15 1407    Balance Exercises: Standing   Tandem Stance Eyes open;2 reps   SLS Eyes open;5 reps   Balance Beam with hurdles x 2 RT; side step over hurdles x 2 Rt    Heel Raises Limitations x15; heel walking x 2 RT  OT Education - 04/27/15 1443    Education provided Yes   Education Details recommended practicing in hand manipulation with coins at home   Person(s) Educated Patient   Methods Explanation;Demonstration   Comprehension Verbalized understanding          OT Short Term Goals - 04/11/15 1512    OT SHORT TERM GOAL #1   Title Pt will be educated on HEP.    Time 4   Period Weeks   Status On-going   OT SHORT TERM GOAL #2   Title Pt will increase right grip strength by 10# to increase ability to hold razor and complete shaving tasks.    Time 4   Period Weeks   Status On-going   OT SHORT TERM GOAL #3   Title Pt will increase right pinch strength by 5# to increase ability to hold onto utensils when eating.    Time 4   Period Weeks   Status On-going   OT SHORT TERM GOAL #4   Title Pt will improve coordination by completing 9 hole peg test in  under 40" with right hand.    Time 4   Period Weeks   Status On-going           OT Long Term Goals - 04/11/15 1513    OT LONG TERM GOAL #1   Title Pt will return to prior level of functioning and independence in all B/IADL tasks.    Time 8   Period Weeks   Status On-going   OT LONG TERM GOAL #2   Title Pt will increase right grip strength by 15# to increase ability to hold onto tools and complete yard work tasks.    Time 8   Period Weeks   Status On-going   OT LONG TERM GOAL #3   Title Pt will increase right pinch strength by 8# to increase ability to complete writing tasks legibly.    Time 8   Period Weeks   Status On-going   OT LONG TERM GOAL #4   Title Pt will increase coordination by completing 9 hole peg test in under 30" using right hand.    Time 8   Period Weeks   Status On-going               Plan - 04/27/15 1444    Clinical Impression Statement A:  Patient having difficulty maintaining constant amount of pressure on items he is gripping, such as pliers.  In hand manipulation activities continue to challenge patient.     Plan P:  Focus on sustained pressure on gripping items that are sensitive (pliers, paper cups, etc), improve independence with in hand manipulation activities and grasping items while reaching overhead.         Problem List There are no active problems to display for this patient.   Vangie Bicker, OTR/L [phone]  04/27/2015, 2:46 PM  [facility] [address] [place], [place], [address] Phone: [phone]   Fax:  [phone]

## 2015-04-27 NOTE — Therapy (Signed)
[place], [place], [address] Phone: [phone]   Fax:  [phone]  Physical Therapy Treatment/ Reassessment Patient Details  Name: Curtis Freeman MRN: [id number] Date of Birth: 1941-03-28 Referring Provider:  Lavera Guise, MD  Encounter Date: 04/27/2015      PT End of Session - 04/27/15 1409    Visit Number 7   Number of Visits 7   Date for PT Re-Evaluation 05/07/15   Authorization Type medicare   Authorization - Visit Number 7   Authorization - Number of Visits 7   PT Start Time 6384   PT Stop Time 1350   PT Time Calculation (min) 45 min   Equipment Utilized During Treatment Gait belt   Activity Tolerance Patient tolerated treatment well      Past Medical History  Diagnosis Date  . Hypertension     No past surgical history on file.  There were no vitals filed for this visit.  Visit Diagnosis:  Decreased coordination  Weakness of right leg  CVA (cerebral vascular accident)  Unstable balance      Subjective Assessment - 04/27/15 1300    Subjective Pt has no complaints.  He states the only time he has trouble walking is in the woods.    How long can you sit comfortably? no problems    How long can you stand comfortably? Ablet to stand for an hour was 5 minutes.    How long can you walk comfortably? Pt is no longer using a cane to walk he has walked up to 20 minutes straight ; (pt was using a cane walking 5 minutes or less )   Currently in Pain? No/denies            [facility] PT Assessment - 04/27/15 0001    Assessment   Medical Diagnosis Lt CVA    Onset Date 03/17/15   Next MD Visit 04/17/2015   Prior Therapy IP rehab    Precautions   Precautions None   Restrictions   Weight Bearing Restrictions No   Home Environment   Living Enviornment Private residence   Living Arrangements Spouse/significant other   Type of [place] to enter   Entrance Stairs-Number of  Steps [place] One level   Prior Function   Level of Independence Independent with basic ADLs   Vocation Retired   Leisure yard work; walking for 2 miles; fix things around the house and read.    Cognition   Overall Cognitive Status Within Functional Limits for tasks assessed   Observation/Other Assessments   Focus on Therapeutic Outcomes (FOTO)  81 was 58   Functional Tests   Functional tests Sit to Stand   Single Leg Stance   Comments Rt 4 seconds Lt 12; was Rt unable Lt 3    Sit to Stand   Comments 5 in 8 seconds ; was 9 seconds   normal for age is 11.6 seconds pt is above normal    Strength   Right Hip Flexion 5/5  was 4+/5    Right Hip Extension 4+/5  was 4/5    Right Hip ABduction 5/5  was 4+/5   Right Knee Flexion 3+/5   Right Knee Extension 5/5   Right Ankle Dorsiflexion 5/5   Right Ankle Plantar Flexion 3-/5  was 3-/5    Merrilee Jansky Balance Test   Sit to Stand Able to stand  without using hands and stabilize independently   Standing Unsupported Able to stand safely 2 minutes   Sitting with Back Unsupported but Feet Supported on Floor or Stool Able to sit safely and securely 2 minutes   Stand to Sit Sits safely with minimal use of hands   Transfers Able to transfer safely, minor use of hands   Standing Unsupported with Eyes Closed Able to stand 10 seconds safely   Standing Ubsupported with Feet Together Able to place feet together independently and stand 1 minute safely   From Standing, Reach Forward with Outstretched Arm Can reach confidently >25 cm (10")   From Standing Position, Pick up Object from Floor Able to pick up shoe safely and easily   From Standing Position, Turn to Look Behind Over each Shoulder Looks behind from both sides and weight shifts well   Turn 360 Degrees Able to turn 360 degrees safely in 4 seconds or less   Standing Unsupported, Alternately Place Feet on Step/Stool Able to stand independently and safely and  complete 8 steps in 20 seconds   Standing Unsupported, One Foot in Front Able to place foot tandem independently and hold 30 seconds   Standing on One Leg Able to lift leg independently and hold equal to or more than 3 seconds   Total Score 54 was 51                      [facility] Adult PT Treatment/Exercise - 04/27/15 0001    Knee/Hip Exercises: Standing   Heel Raises 15 reps   SLS x3 B max Rt 4seconds; Lt 12 seconde              Balance Exercises - 04/27/15 1407    Balance Exercises: Standing   Tandem Stance Eyes open;2 reps   SLS Eyes open;5 reps   Balance Beam with hurdles x 2 RT; side step over hurdles x 2 Rt    Heel Raises Limitations x15; heel walking x 2 RT            PT Education - 04/27/15 1408    Education provided Yes   Education Details updated HEP    Person(s) Educated Patient   Methods Explanation   Comprehension Verbalized understanding;Returned demonstration          PT Short Term Goals - 04/27/15 1322    PT SHORT TERM GOAL #1   Title I in Hep   Time 2   Period Weeks   Status Achieved   PT SHORT TERM GOAL #2   Title Pt to be able to SLS for 10 seconds B to reduce risk of falls    Time 2   Period Weeks   Status On-going   PT SHORT TERM GOAL #3   Title Pt to be walking in the house without an assistive device   Time 2   Period Weeks   Status Achieved   PT SHORT TERM GOAL #4   Title Pt to be able to stand for 15 minutes without fatige to take a shower   Time 2   Period Weeks   Status Achieved           PT Long Term Goals - 04/27/15 1322    PT LONG TERM GOAL #1   Title I in advance HEP   Time 4   Period Weeks   Status Achieved   PT LONG TERM GOAL #2   Title Pt to be walking without a cane  in and outside the home   Time 4   Period Weeks   Status Achieved   PT LONG TERM GOAL #3   Title Pt to be walking for at least 30 mintues at lease 4x a week for better health habits    Time 4   Period Weeks   Status On-going    PT LONG TERM GOAL #4   Title Pt strength to be wnl    Time 4   Period Weeks   Status On-going   PT LONG TERM GOAL #5   Title Pt to beworking out in the yard for two hours withotu fatigue    Time 4   Period Weeks   Status Achieved               Plan - 05-19-15 1409    Clinical Impression Statement Pt has improved in all aspect.  He still has balance deficits but feels that he can work on this at home.  He states that his only limitation walking is due to breathing and not strength or balance.  Pt states that he still is having difficulty with his UE.  Therapist gave pt HEP as he wishes to discontinue therapy and work on remaining deficits at home at this time.    PT Next Visit Plan discharge pt at this time to HEP           G-Codes - 2015/05/19 1413    Functional Assessment Tool Used foto   Functional Limitation Mobility: Walking and moving around   Mobility: Walking and Moving Around Goal Status [phone]) At least 20 percent but less than 40 percent impaired, limited or restricted   Mobility: Walking and Moving Around Discharge Status [phone]) At least 1 percent but less than 20 percent impaired, limited or restricted      Problem List There are no active problems to display for this patient.  Rayetta Humphrey, PT CLT [phone] 05-19-15, 2:14 PM  PHYSICAL THERAPY DISCHARGE SUMMARY  Plan: Patient agrees to discharge.  Patient goals were met. Patient is being discharged due to meeting the stated rehab goals.  ?????    Rayetta Humphrey, [place] [address] [place], [place], [address] Phone: [phone]   Fax:  [phone]

## 2015-04-27 NOTE — Patient Instructions (Signed)
Balance: Unilateral   Attempt to balance on left leg, eyes open. Hold _30___ seconds. Repeat __5__ times per set. Do __1__ sets per session. Do ___2_ sessions per day. Perform exercise with eyes closed.  http://orth.exer.us/28   Copyright  VHI. All rights reserved.  Heel Raise: Unilateral (Standing)   Balance on left foot, then rise on ball of foot. Repeat __10-15__ times per set. Do _1___ sets per session. Do _2___ sessions per day.  http://orth.exer.us/40   Copyright  VHI. All rights reserved.  Feet Heel-Toe "Tandem", Varied Arm Positions - Eyes Open   With eyes open, right foot directly in front of the other, arms out, look straight ahead at a stationary object. Hold _60___ seconds. Repeat _1-2___ times per session. Do ___1_ sessions per day.  Copyright  VHI. All rights reserved.  Marching in Place: Varied Surfaces   March in place, slowly lifting knees toward ceiling. Repeat _10___ times per session. Do _1___ sessions per day. Repeat ____ times with eyes closed. Repeat on compliant surface: ________.  Copyright  VHI. All rights reserved.  Hamstring Curl: Resisted (Prone)   Anchor behind, with tubing on left ankle, leg straight, bend knee. Repeat __10-15__ times per set. Do _1___ sets per session. Do ___1_ sessions per day. http://orth.exer.us/670   Copyright  VHI. All rights reserved.

## 2015-05-01 ENCOUNTER — Encounter (HOSPITAL_COMMUNITY): Payer: Self-pay | Admitting: Speech Pathology

## 2015-05-02 ENCOUNTER — Encounter
Admission: RE | Disposition: A | Discharge status: Home / Self Care | Payer: Self-pay | Source: Ambulatory Visit | Attending: Internal Medicine

## 2015-05-02 ENCOUNTER — Ambulatory Visit
Admission: RE | Admit: 2015-05-02 | Discharge: 2015-05-02 | Disposition: A | Discharge status: Home / Self Care | Payer: Medicare Other | Source: Ambulatory Visit | Attending: Internal Medicine | Admitting: Internal Medicine

## 2015-05-02 ENCOUNTER — Ambulatory Visit (HOSPITAL_COMMUNITY): Payer: Self-pay | Admitting: Physical Therapy

## 2015-05-02 ENCOUNTER — Encounter: Payer: Self-pay | Admitting: *Deleted

## 2015-05-02 ENCOUNTER — Encounter (HOSPITAL_COMMUNITY): Payer: Self-pay | Admitting: Occupational Therapy

## 2015-05-02 DIAGNOSIS — J181 Lobar pneumonia, unspecified organism: Secondary | ICD-10-CM | POA: Insufficient documentation

## 2015-05-02 DIAGNOSIS — Z538 Procedure and treatment not carried out for other reasons: Secondary | ICD-10-CM | POA: Insufficient documentation

## 2015-05-02 HISTORY — PX: FLEXIBLE BRONCHOSCOPY: SHX5094

## 2015-05-02 HISTORY — DX: Malignant (primary) neoplasm, unspecified: C80.1

## 2015-05-02 HISTORY — DX: Cerebral infarction, unspecified: I63.9

## 2015-05-02 HISTORY — DX: Chronic obstructive pulmonary disease, unspecified: J44.9

## 2015-05-02 HISTORY — DX: Pneumonia, unspecified organism: J18.9

## 2015-05-02 SURGERY — BRONCHOSCOPY, FLEXIBLE
Anesthesia: Moderate Sedation

## 2015-05-02 NOTE — OR Nursing (Signed)
Dr Chancy Milroy called. Pt took xeralto yesterday. Pt said he was told never to stop this medication. Dr Chancy Milroy discussed that procedure can not be done due to him taking blood thinner. Pt discussed this with patient via phone. Pt sent home.

## 2015-05-03 ENCOUNTER — Encounter (HOSPITAL_COMMUNITY): Payer: Self-pay | Admitting: Speech Pathology

## 2015-05-04 ENCOUNTER — Encounter (HOSPITAL_COMMUNITY): Payer: Self-pay

## 2015-05-04 ENCOUNTER — Ambulatory Visit (HOSPITAL_COMMUNITY): Payer: Self-pay | Admitting: Physical Therapy

## 2015-05-04 NOTE — H&P (Signed)
Patient was scheduled for a bronchoscopy due to persistent consolidation over the left lower lobe. It was felt to be originally due to aspiration from a prior stroke. After serial x-rays there appeared to be no resolution and so concern was raised for the possibility of an underlying malignancy. Patient now presents for a bronchoscopy.   Upon arrival to the [facility] procedure room I was paged to note that the patient was still on Xarelto. I spoke to the patient and it appears that there had been some miscommunication as to him stopping the Xarelto. The procedure was therefore canceled and we would reschedule.   Allyne Gee, MD [facility] Pulmonary Critical Care Medicine Sleep Medicine

## 2015-05-05 ENCOUNTER — Encounter (HOSPITAL_COMMUNITY): Payer: Self-pay | Admitting: Occupational Therapy

## 2015-05-05 NOTE — Therapy (Signed)
[place], [place], [address] Phone: [phone]   Fax:  [phone]  Patient Details  Name: Curtis Freeman MRN: [id number] Date of Birth: 15-Feb-1941 Referring Provider:  No ref. provider found  Encounter Date: 05/05/2015  Second attempt at obtaining a signed certification order sent today.   Guadelupe Sabin, OTR/L  [phone]  05/05/2015, 3:12 PM  [place] [address] [place], [place], [address] Phone: [phone]   Fax:  [phone]

## 2015-05-09 ENCOUNTER — Encounter (HOSPITAL_COMMUNITY): Payer: Self-pay | Admitting: Occupational Therapy

## 2015-05-09 ENCOUNTER — Ambulatory Visit (HOSPITAL_COMMUNITY): Payer: Self-pay | Admitting: Physical Therapy

## 2015-05-18 DIAGNOSIS — Z8673 Personal history of transient ischemic attack (TIA), and cerebral infarction without residual deficits: Secondary | ICD-10-CM | POA: Insufficient documentation

## 2015-05-19 ENCOUNTER — Ambulatory Visit (HOSPITAL_COMMUNITY): Payer: Medicare Other | Attending: Internal Medicine

## 2015-05-19 ENCOUNTER — Ambulatory Visit (HOSPITAL_COMMUNITY): Payer: Medicare Other | Admitting: Physical Therapy

## 2015-05-19 ENCOUNTER — Encounter (HOSPITAL_COMMUNITY): Payer: Self-pay

## 2015-05-19 DIAGNOSIS — I69351 Hemiplegia and hemiparesis following cerebral infarction affecting right dominant side: Secondary | ICD-10-CM | POA: Insufficient documentation

## 2015-05-19 DIAGNOSIS — R278 Other lack of coordination: Secondary | ICD-10-CM | POA: Insufficient documentation

## 2015-05-19 DIAGNOSIS — R29898 Other symptoms and signs involving the musculoskeletal system: Secondary | ICD-10-CM

## 2015-05-19 DIAGNOSIS — R279 Unspecified lack of coordination: Secondary | ICD-10-CM

## 2015-05-19 NOTE — Therapy (Signed)
[place], [place], [address] Phone: [phone]   Fax:  [phone]  Occupational Therapy Reassessment and Treatment  Patient Details  Name: Curtis Freeman MRN: [id number] Date of Birth: [birth year] Referring Provider:  Lavera Guise, MD  Encounter Date: 05/19/2015      OT End of Session - 05/19/15 1331    Visit Number 8   Number of Visits 24   Authorization Type Medicare A & B   Authorization Time Period Before 10th visit    Authorization - Visit Number 8   Authorization - Number of Visits 10   OT Start Time 1300   OT Stop Time 1330   OT Time Calculation (min) 30 min   Activity Tolerance Patient tolerated treatment well   Behavior During Therapy [facility] for tasks assessed/performed      Past Medical History  Diagnosis Date  . Hypertension   . Pneumonia   . Cancer     lung mass  . Stroke   . COPD (chronic obstructive pulmonary disease)     No past surgical history on file.  There were no vitals filed for this visit.  Visit Diagnosis:  Decreased coordination  Decreased grip strength of right hand  Decreased pinch strength      Subjective Assessment - 05/19/15 1331    Subjective  S: They found a spot on my lungs and they don't know yet if it's something serious. I've been running all over the place going to doctor's.    Special Tests FOTO score: 73/100 (27% impairment)   Currently in Pain? No/denies            [facility] OT Assessment - 05/19/15 1306    Assessment   Diagnosis Left pontine stroke   Precautions   Precautions None   Coordination   Right 9 Hole Peg Test 24.2"  on eval: 57"   AROM   Overall AROM  Within functional limits for tasks performed   Strength   Overall Strength Comments Shoulder and wrist WFL   Right Shoulder Flexion 5/5  on eval: 4+/5   Right Shoulder ABduction 5/5  on eval: 4+/5   Right Shoulder Internal Rotation 5/5  on eval; 4/5   Right Shoulder External  Rotation 5/5  on eval: 4/5   Right Wrist Flexion 5/5  on eval: 4/5   Right Wrist Extension 5/5  on eval: 4/5   Right Hand Grip (lbs) 72  on eval: 36   Right Hand Lateral Pinch 18 lbs  on eval; 9   Right Hand 3 Point Pinch 13 lbs  on eval: 8                            OT Short Term Goals - 05/19/15 1323    OT SHORT TERM GOAL #1   Title Pt will be educated on HEP.    Time 4   Period Weeks   Status Achieved   OT SHORT TERM GOAL #2   Title Pt will increase right grip strength by 10# to increase ability to hold razor and complete shaving tasks.    Time 4   Period Weeks   Status Achieved   OT SHORT TERM GOAL #3   Title Pt will increase right pinch strength by 5# to increase ability to hold onto utensils when eating.    Time 4   Period Weeks   Status Achieved   OT  SHORT TERM GOAL #4   Title Pt will improve coordination by completing 9 hole peg test in under 40" with right hand.    Time 4   Period Weeks   Status Achieved           OT Long Term Goals - 2015-06-11 1324    OT LONG TERM GOAL #1   Title Pt will return to prior level of functioning and independence in all B/IADL tasks.    Time 8   Period Weeks   Status Achieved   OT LONG TERM GOAL #2   Title Pt will increase right grip strength by 15# to increase ability to hold onto tools and complete yard work tasks.    Time 8   Period Weeks   Status Achieved   OT LONG TERM GOAL #3   Title Pt will increase right pinch strength by 8# to increase ability to complete writing tasks legibly.    Time 8   Period Weeks   Status Achieved   OT LONG TERM GOAL #4   Title Pt will increase coordination by completing 9 hole peg test in under 30" using right hand.    Time 8   Period Weeks   Status Achieved               Plan - 2015-06-11 1332    Clinical Impression Statement A: Reassassment completed this date. patient met all therapy goals with the exception of returning to driving which therapist  informed patient it was up to his MD to clear him to drive. Patient has made great progress overall. HEP was updated and patient was educated on tasks and exercises to complete at home.    Plan P: D/C from therapy with HEP.           G-Codes - 2015-06-11 1337    Functional Assessment Tool Used FOTO score: 73/100 (27% impaired)   Functional Limitation Carrying, moving and handling objects   Carrying, Moving and Handling Objects Goal Status ([id number]) At least 1 percent but less than 20 percent impaired, limited or restricted   Carrying, Moving and Handling Objects Discharge Status [phone]) At least 20 percent but less than 40 percent impaired, limited or restricted      Problem List There are no active problems to display for this patient.  OCCUPATIONAL THERAPY DISCHARGE SUMMARY  Visits from Start of Care: 8  Current functional level related to goals / functional outcomes: See goals above   Remaining deficits: See clinical impression statement above.   Education / Equipment: Theraputty, [facility] tasks, strengthening activities.  Plan: Patient agrees to discharge.  Patient goals were met. Patient is being discharged due to meeting the stated rehab goals.  ?????       Ailene Ravel, OTR/L,CBIS  [phone]  06/11/15, 1:38 PM  [place] [address] [place], [place], [address] Phone: [phone]   Fax:  [phone]

## 2015-05-22 ENCOUNTER — Encounter (HOSPITAL_COMMUNITY): Payer: Medicare Other

## 2015-05-22 ENCOUNTER — Ambulatory Visit (HOSPITAL_COMMUNITY): Payer: Medicare Other | Admitting: Physical Therapy

## 2015-05-22 DIAGNOSIS — J449 Chronic obstructive pulmonary disease, unspecified: Secondary | ICD-10-CM | POA: Insufficient documentation

## 2015-06-27 ENCOUNTER — Other Ambulatory Visit: Payer: Self-pay | Admitting: Internal Medicine

## 2015-06-27 DIAGNOSIS — J449 Chronic obstructive pulmonary disease, unspecified: Secondary | ICD-10-CM

## 2015-06-27 DIAGNOSIS — J851 Abscess of lung with pneumonia: Secondary | ICD-10-CM

## 2015-06-28 ENCOUNTER — Inpatient Hospital Stay: Payer: Medicare Other

## 2015-06-28 ENCOUNTER — Inpatient Hospital Stay
Admission: EM | Admit: 2015-06-28 | Discharge: 2015-06-30 | DRG: 166 | Disposition: A | Discharge status: Home / Self Care | Payer: Medicare Other | Attending: Internal Medicine | Admitting: Internal Medicine

## 2015-06-28 ENCOUNTER — Emergency Department: Payer: Medicare Other

## 2015-06-28 DIAGNOSIS — I4891 Unspecified atrial fibrillation: Secondary | ICD-10-CM | POA: Diagnosis present

## 2015-06-28 DIAGNOSIS — I272 Other secondary pulmonary hypertension: Secondary | ICD-10-CM | POA: Diagnosis present

## 2015-06-28 DIAGNOSIS — I481 Persistent atrial fibrillation: Secondary | ICD-10-CM

## 2015-06-28 DIAGNOSIS — Z7951 Long term (current) use of inhaled steroids: Secondary | ICD-10-CM | POA: Diagnosis not present

## 2015-06-28 DIAGNOSIS — R042 Hemoptysis: Secondary | ICD-10-CM | POA: Diagnosis present

## 2015-06-28 DIAGNOSIS — J438 Other emphysema: Secondary | ICD-10-CM | POA: Diagnosis not present

## 2015-06-28 DIAGNOSIS — Z6827 Body mass index (BMI) 27.0-27.9, adult: Secondary | ICD-10-CM

## 2015-06-28 DIAGNOSIS — J449 Chronic obstructive pulmonary disease, unspecified: Secondary | ICD-10-CM | POA: Diagnosis present

## 2015-06-28 DIAGNOSIS — Z87891 Personal history of nicotine dependence: Secondary | ICD-10-CM | POA: Diagnosis not present

## 2015-06-28 DIAGNOSIS — I482 Chronic atrial fibrillation: Secondary | ICD-10-CM | POA: Diagnosis present

## 2015-06-28 DIAGNOSIS — Z7901 Long term (current) use of anticoagulants: Secondary | ICD-10-CM | POA: Diagnosis not present

## 2015-06-28 DIAGNOSIS — R918 Other nonspecific abnormal finding of lung field: Secondary | ICD-10-CM | POA: Diagnosis present

## 2015-06-28 DIAGNOSIS — R0600 Dyspnea, unspecified: Secondary | ICD-10-CM

## 2015-06-28 DIAGNOSIS — E43 Unspecified severe protein-calorie malnutrition: Secondary | ICD-10-CM | POA: Insufficient documentation

## 2015-06-28 DIAGNOSIS — I248 Other forms of acute ischemic heart disease: Secondary | ICD-10-CM | POA: Diagnosis present

## 2015-06-28 DIAGNOSIS — J8410 Pulmonary fibrosis, unspecified: Secondary | ICD-10-CM | POA: Diagnosis present

## 2015-06-28 DIAGNOSIS — J189 Pneumonia, unspecified organism: Secondary | ICD-10-CM | POA: Diagnosis present

## 2015-06-28 DIAGNOSIS — I639 Cerebral infarction, unspecified: Secondary | ICD-10-CM | POA: Clinically undetermined

## 2015-06-28 DIAGNOSIS — Z8673 Personal history of transient ischemic attack (TIA), and cerebral infarction without residual deficits: Secondary | ICD-10-CM

## 2015-06-28 DIAGNOSIS — I10 Essential (primary) hypertension: Secondary | ICD-10-CM | POA: Diagnosis present

## 2015-06-28 DIAGNOSIS — Z79899 Other long term (current) drug therapy: Secondary | ICD-10-CM | POA: Diagnosis not present

## 2015-06-28 DIAGNOSIS — G4733 Obstructive sleep apnea (adult) (pediatric): Secondary | ICD-10-CM | POA: Diagnosis present

## 2015-06-28 LAB — COMPREHENSIVE METABOLIC PANEL
ALK PHOS: 102 U/L (ref 38–126)
ALT: 31 U/L (ref 17–63)
ANION GAP: 12 (ref 5–15)
AST: 28 U/L (ref 15–41)
Albumin: 3.3 g/dL — ABNORMAL LOW (ref 3.5–5.0)
BUN: 17 mg/dL (ref 6–20)
CALCIUM: 8.9 mg/dL (ref 8.9–10.3)
CO2: 25 mmol/L (ref 22–32)
Chloride: 99 mmol/L — ABNORMAL LOW (ref 101–111)
Creatinine, Ser: 1.17 mg/dL (ref 0.61–1.24)
GFR calc Af Amer: >60 mL/min (ref >60)
GFR calc non Af Amer: 59 mL/min — ABNORMAL LOW (ref >60)
Glucose, Bld: 132 mg/dL — ABNORMAL HIGH (ref 65–99)
Potassium: 3.8 mmol/L (ref 3.5–5.1)
Sodium: 136 mmol/L (ref 135–145)
TOTAL PROTEIN: 7.1 g/dL (ref 6.5–8.1)
Total Bilirubin: 1 mg/dL (ref 0.3–1.2)

## 2015-06-28 LAB — CBC
HCT: 44 % (ref 40.0–52.0)
Hemoglobin: 14.5 g/dL (ref 13.0–18.0)
MCH: 27.4 pg (ref 26.0–34.0)
MCHC: 33 g/dL (ref 32.0–36.0)
MCV: 83 fL (ref 80.0–100.0)
PLATELETS: 261 10*3/uL (ref 150–440)
RBC: 5.3 MIL/uL (ref 4.40–5.90)
RDW: 14.7 % — AB (ref 11.5–14.5)
WBC: 9 10*3/uL (ref 3.8–10.6)

## 2015-06-28 LAB — APTT: aPTT: 39 seconds — ABNORMAL HIGH (ref 24–36)

## 2015-06-28 LAB — PROTIME-INR
INR: 1.27
PROTHROMBIN TIME: 16.1 s — AB (ref 11.4–15.0)

## 2015-06-28 LAB — TROPONIN I: Troponin I: 0.06 ng/mL — ABNORMAL HIGH (ref <0.031)

## 2015-06-28 LAB — HEPARIN LEVEL (UNFRACTIONATED): HEPARIN UNFRACTIONATED: 1.44 [IU]/mL — AB (ref 0.30–0.70)

## 2015-06-28 MED ORDER — SODIUM CHLORIDE 0.9 % IV SOLN
INTRAVENOUS | Status: DC
  Administered 2015-06-28: 11:00:00 via INTRAVENOUS

## 2015-06-28 MED ORDER — HEPARIN (PORCINE) IN NACL 100-0.45 UNIT/ML-% IJ SOLN
1200.0000 [IU]/h | INTRAMUSCULAR | Status: DC
  Administered 2015-06-28: 1200 [IU]/h via INTRAVENOUS
  Filled 2015-06-28 (×3): qty 250

## 2015-06-28 MED ORDER — IPRATROPIUM-ALBUTEROL 0.5-2.5 (3) MG/3ML IN SOLN
3.0000 mL | Freq: Once | RESPIRATORY_TRACT | Status: AC
  Administered 2015-06-28: 3 mL via RESPIRATORY_TRACT

## 2015-06-28 MED ORDER — METOPROLOL TARTRATE 1 MG/ML IV SOLN
INTRAVENOUS | Status: AC
  Administered 2015-06-28: 5 mg via INTRAVENOUS
  Filled 2015-06-28: qty 5

## 2015-06-28 MED ORDER — ALBUTEROL SULFATE (2.5 MG/3ML) 0.083% IN NEBU: 2.5000 mg | INHALATION_SOLUTION | RESPIRATORY_TRACT | Status: DC | PRN

## 2015-06-28 MED ORDER — ALBUTEROL SULFATE HFA 108 (90 BASE) MCG/ACT IN AERS: 1.0000 | INHALATION_SPRAY | RESPIRATORY_TRACT | Status: DC | PRN

## 2015-06-28 MED ORDER — ATORVASTATIN CALCIUM 20 MG PO TABS
20.0000 mg | ORAL_TABLET | Freq: Every day | ORAL | Status: DC
  Administered 2015-06-28 – 2015-06-30 (×3): 20 mg via ORAL
  Filled 2015-06-28 (×3): qty 1

## 2015-06-28 MED ORDER — METOPROLOL TARTRATE 1 MG/ML IV SOLN
5.0000 mg | Freq: Once | INTRAVENOUS | Status: AC
  Administered 2015-06-28: 5 mg via INTRAVENOUS

## 2015-06-28 MED ORDER — IPRATROPIUM-ALBUTEROL 0.5-2.5 (3) MG/3ML IN SOLN
RESPIRATORY_TRACT | Status: AC
  Administered 2015-06-28: 3 mL via RESPIRATORY_TRACT
  Filled 2015-06-28: qty 6

## 2015-06-28 MED ORDER — IOHEXOL 300 MG/ML  SOLN
75.0000 mL | Freq: Once | INTRAMUSCULAR | Status: AC | PRN
  Administered 2015-06-28: 75 mL via INTRAVENOUS

## 2015-06-28 MED ORDER — LOSARTAN POTASSIUM 50 MG PO TABS
100.0000 mg | ORAL_TABLET | Freq: Every day | ORAL | Status: DC
  Administered 2015-06-28 – 2015-06-30 (×3): 100 mg via ORAL
  Filled 2015-06-28 (×3): qty 2

## 2015-06-28 MED ORDER — CEFTRIAXONE SODIUM IN DEXTROSE 20 MG/ML IV SOLN: 1.0000 g | INTRAVENOUS | Status: DC

## 2015-06-28 MED ORDER — AZITHROMYCIN 500 MG IV SOLR
500.0000 mg | INTRAVENOUS | Status: DC
  Administered 2015-06-28 – 2015-06-30 (×3): 500 mg via INTRAVENOUS
  Filled 2015-06-28 (×4): qty 500

## 2015-06-28 MED ORDER — DEXTROSE 5 % IV SOLN
1.0000 g | INTRAVENOUS | Status: DC
  Administered 2015-06-28 – 2015-06-30 (×3): 1 g via INTRAVENOUS
  Filled 2015-06-28 (×4): qty 10

## 2015-06-28 MED ORDER — MOMETASONE FURO-FORMOTEROL FUM 100-5 MCG/ACT IN AERO
2.0000 | INHALATION_SPRAY | Freq: Two times a day (BID) | RESPIRATORY_TRACT | Status: DC
  Administered 2015-06-28 – 2015-06-30 (×4): 2 via RESPIRATORY_TRACT
  Filled 2015-06-28: qty 8.8

## 2015-06-28 MED ORDER — DILTIAZEM HCL 25 MG/5ML IV SOLN
10.0000 mg | Freq: Once | INTRAVENOUS | Status: AC
  Administered 2015-06-28: 10 mg via INTRAVENOUS

## 2015-06-28 MED ORDER — HYDROCHLOROTHIAZIDE 25 MG PO TABS
12.5000 mg | ORAL_TABLET | Freq: Every day | ORAL | Status: DC
  Administered 2015-06-28 – 2015-06-30 (×3): 12.5 mg via ORAL
  Filled 2015-06-28 (×3): qty 1

## 2015-06-28 MED ORDER — ZOLPIDEM TARTRATE 5 MG PO TABS
5.0000 mg | ORAL_TABLET | Freq: Every evening | ORAL | Status: DC | PRN
  Administered 2015-06-28 – 2015-06-29 (×2): 5 mg via ORAL
  Filled 2015-06-28 (×2): qty 1

## 2015-06-28 MED ORDER — DILTIAZEM HCL 25 MG/5ML IV SOLN
15.0000 mg | Freq: Once | INTRAVENOUS | Status: AC
  Administered 2015-06-28: 15 mg via INTRAVENOUS

## 2015-06-28 NOTE — Assessment & Plan Note (Signed)
--  History of stroke in April after being off of xarelto for one day.

## 2015-06-28 NOTE — H&P (Signed)
[place] at [place] NAME: Curtis Freeman    MR#:  [id number]  DATE OF BIRTH:  [birth year]  DATE OF ADMISSION:  06/28/2015  PRIMARY CARE PHYSICIAN: Lavera Guise, MD   REQUESTING/REFERRING PHYSICIAN: Gregor Hams, MD  CHIEF COMPLAINT:   Chief Complaint  Patient presents with  . Hemoptysis  . Atrial Fibrillation    HISTORY OF PRESENT ILLNESS:  Curtis Freeman  is a 74 y.o. male with a known history of A. fib on xarelto, COPD, pneumonia, hypertension, CVA this April. The patient is started to have hemoptysis since yesterday. He is to taking xarelto for A. fib. He denies any bloody stool, Melena or bloody urine. He denies any fever, chills, shortness of breath or chest pain. He was found to have a A. fib with RVR at 132. He was treated with the Cardizem and Lopressor in ED. Heart rate decreased to 80s to 90s. Chest x-ray show possible lung malignancy or pneumonia, suggesting bronchoscopy. Dr. Owens Shark requested hospitalist to admit the patient. PAST MEDICAL HISTORY:   Past Medical History  Diagnosis Date  . Hypertension   . Pneumonia   . Cancer     lung mass  . Stroke   . COPD (chronic obstructive pulmonary disease)     PAST SURGICAL HISTORY:  No past surgical history on file. cataract extraction and hemorrhoidectomy.  SOCIAL HISTORY:   History  Substance Use Topics  . Smoking status: Not on file  . Smokeless tobacco: Not on file  . Alcohol Use: Not on file   former smoker, quit smoking 10 years ago. No alcohol DRINKING or illicit drugs.  FAMILY HISTORY:  No family history on file. both parents deceased. Hypertension, heart disease, Alzheimer's disease in his family.  DRUG ALLERGIES:  No Known Allergies  REVIEW OF SYSTEMS:  CONSTITUTIONAL: No fever, fatigue or weakness.  EYES: No blurred or double vision.  EARS, NOSE, AND THROAT: No tinnitus or ear pain.  RESPIRATORY: No cough, shortness of breath, wheezing or  hemoptysis.  CARDIOVASCULAR: No chest pain, orthopnea, edema.  GASTROINTESTINAL: No nausea, vomiting, diarrhea or abdominal pain.  GENITOURINARY: No dysuria, hematuria.  ENDOCRINE: No polyuria, nocturia,  HEMATOLOGY: No anemia, easy bruising or bleeding SKIN: No rash or lesion. MUSCULOSKELETAL: No joint pain or arthritis.   NEUROLOGIC: No tingling, numbness, weakness.  PSYCHIATRY: No anxiety or depression.   MEDICATIONS AT HOME:   Prior to Admission medications   Medication Sig Start Date End Date Taking? Authorizing Provider  atorvastatin (LIPITOR) 20 MG tablet Take 20 mg by mouth daily.   Yes Historical Provider, MD  hydrochlorothiazide (HYDRODIURIL) 12.5 MG tablet Take 12.5 mg by mouth daily.   Yes Historical Provider, MD  losartan (COZAAR) 100 MG tablet Take 100 mg by mouth daily.   Yes Historical Provider, MD  mometasone-formoterol (DULERA) 100-5 MCG/ACT AERO Inhale 2 puffs into the lungs 2 (two) times daily.   Yes Historical Provider, MD  rivaroxaban (XARELTO) 20 MG TABS tablet Take 20 mg by mouth daily with supper.   Yes Historical Provider, MD      VITAL SIGNS:  Blood pressure 135/93, pulse 87, temperature 98.7 F (37.1 C), resp. rate 24, SpO2 95 %.  PHYSICAL EXAMINATION:  GENERAL:  74 y.o.-year-old patient lying in the bed with no acute distress.  EYES: Pupils equal, round, reactive to light and accommodation. No scleral icterus. Extraocular muscles intact.  HEENT: Head atraumatic, normocephalic. Oropharynx and nasopharynx clear. Hemoptysis while examing the  patient  NECK:  Supple, no jugular venous distention. No thyroid enlargement, no tenderness.  LUNGS: Normal breath sounds bilaterally, has expiratory mild wheezing, no rales,rhonchi or crepitation. No use of accessory muscles of respiration.  CARDIOVASCULAR: S1, S2 normal. No murmurs, rubs, or gallops.  ABDOMEN: Soft, nontender, nondistended. Bowel sounds present. No organomegaly or mass.  EXTREMITIES: No pedal edema,  cyanosis, or clubbing.  NEUROLOGIC: Cranial nerves II through XII are intact. Muscle strength 5/5 in all extremities. Sensation intact. Gait not checked.  PSYCHIATRIC: The patient is alert and oriented x 3.  SKIN: No obvious rash, lesion, or ulcer.   LABORATORY PANEL:   CBC  Recent Labs Lab 06/28/15 0544  WBC 9.0  HGB 14.5  HCT 44.0  PLT 261   ------------------------------------------------------------------------------------------------------------------  Chemistries   Recent Labs Lab 06/28/15 0544  NA 136  K 3.8  CL 99*  CO2 25  GLUCOSE 132*  BUN 17  CREATININE 1.17  CALCIUM 8.9  AST 28  ALT 31  ALKPHOS 102  BILITOT 1.0   ------------------------------------------------------------------------------------------------------------------  Cardiac Enzymes  Recent Labs Lab 06/28/15 0544  TROPONINI 0.06*   ------------------------------------------------------------------------------------------------------------------  RADIOLOGY:  Dg Chest Portable 1 View  06/28/2015   CLINICAL DATA:  Acute onset of hemoptysis. Atrial fibrillation. Initial encounter.  EXAM: PORTABLE CHEST - 1 VIEW  COMPARISON:  Chest radiograph performed 04/21/2015, and CT of the chest performed 04/25/2015  FINDINGS: There is increasing left perihilar airspace opacification, which may reflect a chronic atypical infection, given hemoptysis and gradual worsening from the prior study. Mild patchy right basilar airspace opacity is noted. No definite pleural effusion or pneumothorax is seen.  The cardiomediastinal silhouette is borderline normal in size. No acute osseous abnormalities are identified.  IMPRESSION: Increasing left perihilar airspace opacification, which may reflect a chronic atypical infection or possibly an unusual malignancy, given hemoptysis and gradual worsening from the prior study. Mild patchy right basilar airspace opacity seen. Pulmonary consultation was recommended per the prior CT,  for possible bronchoscopy and tissue sampling.   Electronically Signed   By: Garald Balding M.D.   On: 06/28/2015 06:50    EKG:   Orders placed or performed during the hospital encounter of 06/28/15  . ED EKG  . ED EKG  . EKG 12-Lead  . EKG 12-Lead    IMPRESSION AND PLAN:   Hemoptysis Possible pneumonia, need to rule out lung malignancy A. fib with RVR Elevated troponin, possibly due to demanding ischemia from A. fib. COPD with emphysema Hypertension History of CVA  Patient will be admitted to medical floor. I will start Zithromax and Rocephin, follow-up CBC and sputum culture. I will request a pulmonary consult for possible bronchoscopy. Hold xarelto due to hemoptysis and bronchoscopy. For A. Fib, continue Cardizem and the Lopressor, Hold xarelto now and request cardiology consult.   All the records are reviewed and case discussed with ED provider. Management plans discussed with the patient, family and they are in agreement.  CODE STATUS: Full code.  TOTAL TIME TAKING CARE OF THIS PATIENT: 56 minutes.    Demetrios Loll M.D on 06/28/2015 at 7:53 AM  Between 7am to 6pm - Pager - [phone]  After 6pm go to [email or web address] - password [email or web address] [facility]  Office  [phone]  CC: Primary care physician; Lavera Guise, MD

## 2015-06-28 NOTE — Assessment & Plan Note (Addendum)
--  Suspect lung ca vs. Infection.  --[place] bronchoscopy tentatively planned for tomorrow.

## 2015-06-28 NOTE — Progress Notes (Signed)
PULMONARY/CRITICAL CARE CONSULTATION NOTE:   Problem Assessment/Plan Cardiovascular and Mediastinum Stroke Assessment & Plan --History of stroke in April after being off of xarelto for one day.    Atrial fibrillation Assessment & Plan --Now rate controlled.  --Given high stroke risk, will anticoagulation with heparin, for bridging before bronchoscopy.   Respiratory Pneumonia Assessment & Plan --Consolidation/mass in superior segment of LLL as well as mediastinal/hilar lymphadenopathy.  --Discussed with patient and family, will repeat CT scan and plan for bronchoscopy if there is still evidence of consolidation.  --Continue abx.   Other emphysema Assessment & Plan --Severe emphysema, currently adequately controlled.  --Severe chronic bronchitis with chronic expectoration and cough.  --Continue advair/spiriva.   Pulmonary fibrosis Assessment & Plan --Currently has some sub-pleural fibrosis seen on CT chest, this appears mild.  Obstructive sleep apnea Assessment & Plan --Continue home CPAP, asked family to bring in his home unit for use here.   Other Lung mass Assessment & Plan --Suspect lung ca vs. Infection.  --Ebus bronchoscopy tentatively planned for tomorrow.    Date: 06/28/2015  MRN# [id number] VIBHAV WADDILL May 07, 1941   Referring Physician: Dr. Thyra Breed FOUNT BAHE is a 74 y.o. old male 74 y.o. old male seen in consultation for Hemoptysis.   CC:  Chief Complaint  Patient presents with  . Hemoptysis  . Atrial Fibrillation    HPI:  Mandel Seiden. is a 74 y.o. With a  Hx of COPD, HTN , OSA and CVA -Rt sided weakness in April 2016, and apparently pulmonary fibrosis per his physician's note from about 3 weeks ago. He was noted to have had a CT chest ion 04/25/15 which showed persistent consolidation Left lower lobe and posterior segment of left upper lobe and a 5 mm new Rt apical nodule. Pt had been scheduled for bronchoscopy that was apparently cancelled at the last  minute. At that time he had also been treated with a course of abx and prednisone. Recently treated with a course of Ceftin and Prednisone taper , he has been maintained on advair, spiriva, ventolin.  Has a persistent cough and intermittent sputum production    ECHO Mildly reduced LV systolic function EF 16-96% , Moderate TR, Moderate MR, Mild LVH, Mod Pulmonary HTN Sleeps -poorly.   PMHX:   Past Medical History  Diagnosis Date  . Hypertension   . Pneumonia   . Cancer     lung mass  . Stroke   . COPD (chronic obstructive pulmonary disease)    Surgical Hx:  History reviewed. No pertinent past surgical history. Family Hx:  History reviewed. No pertinent family history. Social Hx:   History  Substance Use Topics  . Smoking status: Former Smoker    Quit date: 06/27/2005  . Smokeless tobacco: Not on file  . Alcohol Use: Not on file   Medication:   No current outpatient prescriptions on file.  Currently meds reviewed.   Allergies:  Review of patient's allergies indicates no known allergies.  Review of Systems: Gen:  Denies  fever, sweats, chills HEENT: Denies blurred vision, double vision, ear pain, eye pain, hearing loss, nose bleeds, sore throat Cvc:  No dizziness, chest pain or heaviness Resp:   Denies cough or sputum porduction, shortness of breath Gi: Denies swallowing difficulty, stomach pain, nausea or vomiting, diarrhea, constipation, bowel incontinence Gu:  Denies bladder incontinence, burning urine Ext:   No Joint pain, stiffness or swelling Skin: No skin rash, easy bruising or bleeding or hives Endoc:  No polyuria, polydipsia , polyphagia  or weight change Psych: No depression, insomnia or hallucinations  Other:  All other systems negative  Physical Examination:   VS: BP 120/88 mmHg  Pulse 85  Temp(Src) 98.4 F (36.9 C) (Oral)  Resp 24  SpO2 91%  General Appearance: No distress  Neuro:without focal findings, mental status, speech normal, alert and  oriented, cranial nerves 2-12 intact, reflexes normal and symmetric, sensation grossly normal  HEENT: PERRLA, EOM intact, no ptosis, no other lesions noticed Pulmonary: normal breath sounds., diaphragmatic excursion normal.No wheezing, No rales;   Sputum Production:   CardiovascularNormal S1,S2.  No m/r/g.  Abdominal aorta pulsation normal.    Abdomen: Benign, Soft, non-tender, No masses, hepatosplenomegaly, No lymphadenopathy Renal:  No costovertebral tenderness  GU:  No performed at this time. Endoc: No evident thyromegaly, no signs of acromegaly or Cushing features Skin:   warm, no rashes, no ecchymosis  Extremities: normal, no cyanosis, clubbing, no edema, warm with normal capillary refill. Other findings:   Labs results: Reviewed.  Rad results: CT images and report reviewed: There is lymphadenopathy  Noted in 4R, 4L, subcarinal areas. There is consolidation of the superior segment of the LLL.     Thank  you for the consultation and for allowing [facility] to assist in the care of your patient. Our recommendations are noted above.  Please contact us if we can be of further service.   Marda Stalker, MD [facility] Pager [phone]

## 2015-06-28 NOTE — Progress Notes (Signed)
ANTICOAGULATION CONSULT NOTE - Initial Consult  Pharmacy Consult for Heparin Indication: atrial fibrillation  No Known Allergies  Patient Measurements: Height: '5\' 10"'$  (177.8 cm) Weight: 190 lb 14.4 oz (86.592 kg) IBW/kg (Calculated) : 73 Heparin Dosing Weight: 86.6 kg  Vital Signs: Temp: 98.4 F (36.9 C) (07/20 1601) Temp Source: Oral (07/20 1601) BP: 122/74 mmHg (07/20 1601) Pulse Rate: 107 (07/20 1601)  Labs:  Recent Labs  06/28/15 0544  HGB 14.5  HCT 44.0  PLT 261  CREATININE 1.17  TROPONINI 0.06*    Estimated Creatinine Clearance: 56.3 mL/min (by C-G formula based on Cr of 1.17).   Medical History: Past Medical History  Diagnosis Date  . Hypertension   . Pneumonia   . Cancer     lung mass  . Stroke   . COPD (chronic obstructive pulmonary disease)     Medications:  Prescriptions prior to admission  Medication Sig Dispense Refill Last Dose  . atorvastatin (LIPITOR) 20 MG tablet Take 20 mg by mouth daily.   06/27/2015 at pm  . Fluticasone-Salmeterol (ADVAIR DISKUS) 250-50 MCG/DOSE AEPB Inhale 1 puff into the lungs 2 (two) times daily.   06/27/2015 at pm  . hydrochlorothiazide (HYDRODIURIL) 12.5 MG tablet Take 12.5 mg by mouth daily.   06/27/2015 at pm  . losartan (COZAAR) 100 MG tablet Take 100 mg by mouth daily.   06/27/2015 at pm  . mometasone-formoterol (DULERA) 100-5 MCG/ACT AERO Inhale 2 puffs into the lungs 2 (two) times daily.   06/27/2015 at pm  . rivaroxaban (XARELTO) 20 MG TABS tablet Take 20 mg by mouth daily with supper.   06/27/2015 at pm  . VENTOLIN HFA 108 (90 BASE) MCG/ACT inhaler Inhale 1 puff into the lungs every 4 (four) hours as needed for wheezing or shortness of breath.   0 Past Month at Unknown time  . zolpidem (AMBIEN) 5 MG tablet Take 1 tablet by mouth at bedtime as needed for sleep.    Past Week at Unknown time   Scheduled:  . atorvastatin  20 mg Oral Daily  . azithromycin  500 mg Intravenous Q24H  . cefTRIAXone (ROCEPHIN) IVPB 1  gram/50 mL D5W  1 g Intravenous Q24H  . hydrochlorothiazide  12.5 mg Oral Daily  . losartan  100 mg Oral Daily  . mometasone-formoterol  2 puff Inhalation BID   Infusions:  . sodium chloride 50 mL/hr at 06/28/15 1054  . heparin     PRN: albuterol, zolpidem  Assessment: Patient on rivaroxaban PTA with afib ordered and scheduled bronchoscopy 7/21 to convert to heparin drip. Last dose of rivaroxaban was 7/19 PM.   Goal of Therapy:  Heparin level 0.3-0.7 units/ml  Or aPTT 68-109 Monitor platelets by anticoagulation protocol: Yes   Plan:  Will check baseline aPTT, INR and HL. Will begin heparin without bolus at 1200 units/hr and order aPTT/HL 8 hours out.   Ulice Dash D 06/28/2015,4:52 PM

## 2015-06-28 NOTE — ED Notes (Signed)
Pt reports coughing up blood and being in a-fib.

## 2015-06-28 NOTE — ED Notes (Signed)
Call from Lab, critical lab value report. Dr Owens Shark notified of troponin level.

## 2015-06-28 NOTE — Assessment & Plan Note (Signed)
--  Continue home CPAP, asked family to bring in his home unit for use here.

## 2015-06-28 NOTE — ED Notes (Signed)
Pt transported to floor 2C via stretcher by this RN with his daughter. Pt stable with no complaints at this time. Transported on 2L 02 via [place].

## 2015-06-28 NOTE — Assessment & Plan Note (Signed)
--  Severe emphysema, currently adequately controlled.  --Severe chronic bronchitis with chronic expectoration and cough.  --Continue advair/spiriva.

## 2015-06-28 NOTE — ED Notes (Signed)
Call to [facility], spoke with Kenney Houseman RN and gave report.

## 2015-06-28 NOTE — Consult Note (Signed)
[facility] Consultation Note  Patient ID: Curtis Freeman, MRN: [id number], DOB/AGE: 01/30/1941 74 y.o. Admit date: 06/28/2015   Date of Consult: 06/28/2015 Primary Physician: Lavera Guise, MD Primary Cardiologist: Nehemiah Massed  Chief Complaint:  Chief Complaint  Patient presents with  . Hemoptysis  . Atrial Fibrillation   Reason for Consult: atrial fibrillation with rapid ventricular rate and elevated troponin  HPI: 74 y.o. male with known chronic atrial fibrillation with controlled ventricular rate and previous right and/or recent stroke likely secondary to embolic phenomenon with full resolution of symptoms who is had recent episode of hemoptysis most consistent with a lung mass. The patient has had discontinuation of Xarelto for which he has had no further evidence of hemoptysis. The patient does have a elevated troponin of 0.06 most consistent with demand ischemia rather than acute coronary syndrome. He does have chronic obstructive pulmonary disease as well which is stable. EKG today shows atrial fibrillation with controlled ventricular rate and right bundle-branch block. The patient is comfortable at this time and has no evidence of congestive heart failure and/or anginal symptoms at this time  Past Medical History  Diagnosis Date  . Hypertension   . Pneumonia   . Cancer     lung mass  . Stroke   . COPD (chronic obstructive pulmonary disease)       Surgical History: History reviewed. No pertinent past surgical history.   Home Meds: Prior to Admission medications   Medication Sig Start Date End Date Taking? Authorizing Provider  atorvastatin (LIPITOR) 20 MG tablet Take 20 mg by mouth daily.   Yes Historical Provider, MD  Fluticasone-Salmeterol (ADVAIR DISKUS) 250-50 MCG/DOSE AEPB Inhale 1 puff into the lungs 2 (two) times daily.   Yes Historical Provider, MD  hydrochlorothiazide (HYDRODIURIL) 12.5 MG tablet Take 12.5 mg by mouth daily.   Yes Historical Provider, MD   losartan (COZAAR) 100 MG tablet Take 100 mg by mouth daily.   Yes Historical Provider, MD  mometasone-formoterol (DULERA) 100-5 MCG/ACT AERO Inhale 2 puffs into the lungs 2 (two) times daily.   Yes Historical Provider, MD  rivaroxaban (XARELTO) 20 MG TABS tablet Take 20 mg by mouth daily with supper.   Yes Historical Provider, MD  VENTOLIN HFA 108 (90 BASE) MCG/ACT inhaler Inhale 1 puff into the lungs every 4 (four) hours as needed for wheezing or shortness of breath.    Yes Historical Provider, MD  zolpidem (AMBIEN) 5 MG tablet Take 1 tablet by mouth at bedtime as needed for sleep.    Yes Historical Provider, MD    Inpatient Medications:  . atorvastatin  20 mg Oral Daily  . azithromycin  500 mg Intravenous Q24H  . cefTRIAXone (ROCEPHIN) IVPB 1 gram/50 mL D5W  1 g Intravenous Q24H  . hydrochlorothiazide  12.5 mg Oral Daily  . losartan  100 mg Oral Daily  . mometasone-formoterol  2 puff Inhalation BID   . sodium chloride 50 mL/hr at 06/28/15 1054  . heparin      Allergies: No Known Allergies  History   Social History  . Marital Status: Married    Spouse Name: N/A  . Number of Children: N/A  . Years of Education: N/A   Occupational History  . Not on file.   Social History Main Topics  . Smoking status: Former Smoker    Quit date: 06/27/2005  . Smokeless tobacco: Not on file  . Alcohol Use: Not on file  . Drug Use: Not on file  . Sexual  Activity: Not on file   Other Topics Concern  . Not on file   Social History Narrative     History reviewed. No pertinent family history.   Review of Systems Positive for hemoptysis shortness of breath Negative for: General:  chills, fever, night sweats or weight changes.  Cardiovascular: PND orthopnea syncope dizziness  Dermatological skin lesions rashes Respiratory: Cough congestion Urologic: Frequent urination urination at night and hematuria Abdominal: negative for nausea, vomiting, diarrhea, bright red blood per rectum,  melena, or hematemesis Neurologic: negative for visual changes, and/or hearing changes  All other systems reviewed and are otherwise negative except as noted above.  Labs:  Recent Labs  06/28/15 0544  TROPONINI 0.06*   Lab Results  Component Value Date   WBC 9.0 06/28/2015   HGB 14.5 06/28/2015   HCT 44.0 06/28/2015   MCV 83.0 06/28/2015   PLT 261 06/28/2015    Recent Labs Lab 06/28/15 0544  NA 136  K 3.8  CL 99*  CO2 25  BUN 17  CREATININE 1.17  CALCIUM 8.9  PROT 7.1  BILITOT 1.0  ALKPHOS 102  ALT 31  AST 28  GLUCOSE 132*   Lab Results  Component Value Date   CHOL 136 03/16/2015   HDL 46 03/16/2015   LDLCALC 77 03/16/2015   TRIG 66 03/16/2015   No results found for: DDIMER  Radiology/Studies:  Dg Chest Portable 1 View  06/28/2015   CLINICAL DATA:  Acute onset of hemoptysis. Atrial fibrillation. Initial encounter.  EXAM: PORTABLE CHEST - 1 VIEW  COMPARISON:  Chest radiograph performed 04/21/2015, and CT of the chest performed 04/25/2015  FINDINGS: There is increasing left perihilar airspace opacification, which may reflect a chronic atypical infection, given hemoptysis and gradual worsening from the prior study. Mild patchy right basilar airspace opacity is noted. No definite pleural effusion or pneumothorax is seen.  The cardiomediastinal silhouette is borderline normal in size. No acute osseous abnormalities are identified.  IMPRESSION: Increasing left perihilar airspace opacification, which may reflect a chronic atypical infection or possibly an unusual malignancy, given hemoptysis and gradual worsening from the prior study. Mild patchy right basilar airspace opacity seen. Pulmonary consultation was recommended per the prior CT, for possible bronchoscopy and tissue sampling.   Electronically Signed   By: Garald Balding M.D.   On: 06/28/2015 06:50    EKG: Atrial fibrillation with right bundle branch block  Weights: Filed Weights   06/28/15 1641  Weight: 190 lb  14.4 oz (86.592 kg)     Physical Exam: Blood pressure 122/74, pulse 107, temperature 98.4 F (36.9 C), temperature source Oral, resp. rate 16, height '5\' 10"'$  (1.778 m), weight 190 lb 14.4 oz (86.592 kg), SpO2 95 %. Body mass index is 27.39 kg/(m^2). General: Well developed, well nourished, in no acute distress. Head eyes ears nose throat: Normocephalic, atraumatic, sclera non-icteric, no xanthomas, nares are without discharge. No apparent thyromegaly and/or mass  Lungs: Normal respiratory effort.  Diffuse wheezes, no rales, few rhonchi.  Heart: Irregular with normal S1 S2. no murmur gallop, no rub, PMI is normal size and placement, carotid upstroke normal without bruit, jugular venous pressure is normal Abdomen: Soft, non-tender, non-distended with normoactive bowel sounds. No hepatomegaly. No rebound/guarding. No obvious abdominal masses. Abdominal aorta is normal size without bruit Extremities: No edema. no cyanosis, no clubbing, no ulcers  Peripheral : 2+ bilateral upper extremity pulses, 2+ bilateral femoral pulses, 2+ bilateral dorsal pedal pulse Neuro: Alert and oriented. No facial asymmetry. No focal deficit. Moves  all extremities spontaneously. Musculoskeletal: Normal muscle tone without kyphosis Psych:  Responds to questions appropriately with a normal affect.    Assessment: 74 year old male with chronic nonvalvular atrial fibrillation with variable rate and recent stroke with elevated troponin consistent with demand ischemia with no current evidence of myocardial infarction anginal symptoms and/or congestive heart failure at lowest risk possible for bronchoscopy and biopsy further evaluation of lung mass  Plan: 1. Discontinuation of Xarelto due to bleeding and hemoptysis 2. Heparin for further risk reduction in stroke with atrial fibrillation and restart after bronchoscopy when able to reduce possibility of bleeding and stroke risk 3. No additional medication management for heart  rate control which appears to be stable at this time 4. No further cardiac workup or intervention of elevated troponin consistent with demand ischemia 5. No further cardiac diagnostics necessary at this time 6. Proceed to bronchoscopy without restriction  Signed, Corey Skains M.D. [facility] 06/28/2015, 6:05 PM

## 2015-06-28 NOTE — Assessment & Plan Note (Signed)
--  Consolidation/mass in superior segment of LLL as well as mediastinal/hilar lymphadenopathy.  --Discussed with patient and family, will repeat CT scan and plan for bronchoscopy if there is still evidence of consolidation.  --Continue abx.

## 2015-06-28 NOTE — Assessment & Plan Note (Signed)
--  Now rate controlled.  --Given high stroke risk, will anticoagulation with heparin, for bridging before bronchoscopy.

## 2015-06-28 NOTE — Assessment & Plan Note (Signed)
--  Currently has some sub-pleural fibrosis seen on CT chest, this appears mild.

## 2015-06-28 NOTE — ED Notes (Signed)
Received report from off-going RN [place].

## 2015-06-28 NOTE — ED Provider Notes (Signed)
[facility] Emergency Department Provider Note  ____________________________________________  Time seen: 5:50 AM  I have reviewed the triage vital signs and the nursing notes.   HISTORY  Chief Complaint Hemoptysis and Atrial Fibrillation      HPI RECARDO LINN is a 74 y.o. male presents with productive cough, scant hemoptysis 1 day. Patient denies any fever no nausea or vomiting. Of note patient has a history of COPD and atrial fibrillation for which she takes Xarelto. Of note patient presents to the emergency department with atrial fibrillation with rapid ventricular response. Patient denies any chest pain no dizziness.   Past Medical History  Diagnosis Date  . Hypertension   . Pneumonia   . Cancer     lung mass  . Stroke   . COPD (chronic obstructive pulmonary disease)     There are no active problems to display for this patient.   No past surgical history on file.  Current Outpatient Rx  Name  Route  Sig  Dispense  Refill  . atorvastatin (LIPITOR) 20 MG tablet   Oral   Take 20 mg by mouth daily.         . hydrochlorothiazide (HYDRODIURIL) 12.5 MG tablet   Oral   Take 12.5 mg by mouth daily.         Marland Kitchen losartan (COZAAR) 100 MG tablet   Oral   Take 100 mg by mouth daily.         . mometasone-formoterol (DULERA) 100-5 MCG/ACT AERO   Inhalation   Inhale 2 puffs into the lungs 2 (two) times daily.         . rivaroxaban (XARELTO) 20 MG TABS tablet   Oral   Take 20 mg by mouth daily with supper.           Allergies Review of patient's allergies indicates no known allergies.  No family history on file.  Social History History  Substance Use Topics  . Smoking status: Not on file  . Smokeless tobacco: Not on file  . Alcohol Use: Not on file    Review of Systems  Constitutional: Negative for fever. Eyes: Negative for visual changes. ENT: Negative for sore throat. Cardiovascular: Negative for chest  pain. Respiratory: Negative for shortness of breath. Positive for cough and hemoptysis. Gastrointestinal: Negative for abdominal pain, vomiting and diarrhea. Genitourinary: Negative for dysuria. Musculoskeletal: Negative for back pain. Skin: Negative for rash. Neurological: Negative for headaches, focal weakness or numbness.   10-point ROS otherwise negative.  ____________________________________________   PHYSICAL EXAM:  VITAL SIGNS: ED Triage Vitals  Enc Vitals Group     BP 06/28/15 0541 146/90 mmHg     Pulse Rate 06/28/15 0541 128     Resp 06/28/15 0541 18     Temp 06/28/15 0541 98.7 F (37.1 C)     Temp src --      SpO2 06/28/15 0541 93 %     Weight --      Height --      Head Cir --      Peak Flow --      Pain Score --      Pain Loc --      Pain Edu? --      Excl. in [place]? --      Constitutional: Alert and oriented. Well appearing and in no distress. Eyes: Conjunctivae are normal. PERRL. Normal extraocular movements. ENT   Head: Normocephalic and atraumatic.   Nose: No congestion/rhinnorhea.   Mouth/Throat: Mucous  membranes are moist.   Neck: No stridor. Cardiovascular: Irregular regular rhythm. Normal and symmetric distal pulses are present in all extremities. No murmurs, rubs, or gallops. Respiratory: Normal respiratory effort without tachypnea nor retractions. Breath sounds are clear and equal bilaterally. No wheezes/rales/rhonchi. Gastrointestinal: Soft and nontender. No distention. There is no CVA tenderness. Genitourinary: deferred Musculoskeletal: Nontender with normal range of motion in all extremities. No joint effusions.  No lower extremity tenderness nor edema. Neurologic:  Normal speech and language. No gross focal neurologic deficits are appreciated. Speech is normal.  Skin:  Skin is warm, dry and intact. No rash noted. Psychiatric: Mood and affect are normal. Speech and behavior are normal. Patient exhibits appropriate insight and  judgment.  ____________________________________________    LABS (pertinent positives/negatives)    ____________________________________________   EKG  ED ECG REPORT I, BROWN, [place] N, the attending physician, personally viewed and interpreted this ECG.   Date: 06/28/2015  EKG Time: 5:40 AM  Rate: 132  Rhythm:   Axis: Normal  Intervals:   ST&T Change: None   ____________________________________________    RADIOLOGY  CHEST X_RAY REVEALED:   IMPRESSION: Increasing left perihilar airspace opacification, which may reflect a chronic atypical infection or possibly an unusual malignancy, given hemoptysis and gradual worsening from the prior study. Mild patchy right basilar airspace opacity seen. Pulmonary consultation was recommended per the prior CT, for possible bronchoscopy and tissue sampling.   Electronically Signed By: Garald Balding M.D. On: 06/28/2015 06:50   INITIAL IMPRESSION / ASSESSMENT AND PLAN / ED COURSE  Pertinent labs & imaging results that were available during my care of the patient were reviewed by me and considered in my medical decision making (see chart for details). Given chest x-ray findings patient discussed with Dr. Bridgett Larsson for hospital admission for bronchoscopy.   ____________________________________________   FINAL CLINICAL IMPRESSION(S) / ED DIAGNOSES  Final diagnoses:  Hemoptysis      Gregor Hams, MD 06/28/15 [phone]

## 2015-06-29 ENCOUNTER — Inpatient Hospital Stay: Payer: Medicare Other | Admitting: Anesthesiology

## 2015-06-29 ENCOUNTER — Encounter
Admission: EM | Disposition: A | Discharge status: Home / Self Care | Payer: Self-pay | Source: Home / Self Care | Attending: Internal Medicine

## 2015-06-29 ENCOUNTER — Encounter: Payer: Self-pay | Admitting: Anesthesiology

## 2015-06-29 DIAGNOSIS — J438 Other emphysema: Secondary | ICD-10-CM

## 2015-06-29 HISTORY — PX: ENDOBRONCHIAL ULTRASOUND: SHX5096

## 2015-06-29 LAB — APTT: APTT: 46 s — AB (ref 24–36)

## 2015-06-29 LAB — EXPECTORATED SPUTUM ASSESSMENT W REFEX TO RESP CULTURE

## 2015-06-29 LAB — HEPARIN LEVEL (UNFRACTIONATED): HEPARIN UNFRACTIONATED: 1.7 [IU]/mL — AB (ref 0.30–0.70)

## 2015-06-29 SURGERY — ENDOBRONCHIAL ULTRASOUND (EBUS)
Anesthesia: General | Laterality: Left

## 2015-06-29 MED ORDER — OXYCODONE HCL 5 MG/5ML PO SOLN: 5.0000 mg | Freq: Once | ORAL | Status: AC | PRN

## 2015-06-29 MED ORDER — DEXAMETHASONE SODIUM PHOSPHATE 4 MG/ML IJ SOLN
INTRAMUSCULAR | Status: DC | PRN
  Administered 2015-06-29: 10 mg via INTRAVENOUS

## 2015-06-29 MED ORDER — HEPARIN (PORCINE) IN NACL 100-0.45 UNIT/ML-% IJ SOLN
1200.0000 [IU]/h | INTRAMUSCULAR | Status: DC
  Administered 2015-06-29: 1200 [IU]/h via INTRAVENOUS
  Filled 2015-06-29 (×3): qty 250

## 2015-06-29 MED ORDER — PROPOFOL 10 MG/ML IV BOLUS
INTRAVENOUS | Status: DC | PRN
  Administered 2015-06-29: 50 mg via INTRAVENOUS
  Administered 2015-06-29: 170 mg via INTRAVENOUS

## 2015-06-29 MED ORDER — FENTANYL CITRATE (PF) 100 MCG/2ML IJ SOLN
INTRAMUSCULAR | Status: DC | PRN
  Administered 2015-06-29 (×2): 50 ug via INTRAVENOUS

## 2015-06-29 MED ORDER — PHENYLEPHRINE HCL 10 MG/ML IJ SOLN
INTRAMUSCULAR | Status: DC | PRN
  Administered 2015-06-29: 100 ug via INTRAVENOUS
  Administered 2015-06-29: 200 ug via INTRAVENOUS
  Administered 2015-06-29 (×5): 100 ug via INTRAVENOUS

## 2015-06-29 MED ORDER — IPRATROPIUM-ALBUTEROL 0.5-2.5 (3) MG/3ML IN SOLN
3.0000 mL | Freq: Once | RESPIRATORY_TRACT | Status: AC | PRN
  Administered 2015-06-29: 3 mL via RESPIRATORY_TRACT

## 2015-06-29 MED ORDER — LIDOCAINE HCL (CARDIAC) 20 MG/ML IV SOLN
INTRAVENOUS | Status: DC | PRN
  Administered 2015-06-29: 60 mg via INTRAVENOUS

## 2015-06-29 MED ORDER — OXYCODONE HCL 5 MG PO TABS: 5.0000 mg | ORAL_TABLET | Freq: Once | ORAL | Status: AC | PRN

## 2015-06-29 MED ORDER — HEPARIN (PORCINE) IN NACL 100-0.45 UNIT/ML-% IJ SOLN: 12.0000 [IU]/kg/h | INTRAMUSCULAR | Status: DC

## 2015-06-29 MED ORDER — FENTANYL CITRATE (PF) 100 MCG/2ML IJ SOLN
25.0000 ug | INTRAMUSCULAR | Status: DC | PRN
Start: 2015-06-29 — End: 2015-06-30

## 2015-06-29 NOTE — Progress Notes (Signed)
PROGRESS NOTE  Curtis Freeman CBS:[id number] DOB: 10-18-1941 DOA: 06/28/2015 PCP: Curtis Guise, MD  HPI/Recap of past 77  74  Y/o m with hx of A-fib, COPD, HTN, previous CVA, Pneumonia admitted with hemoptysis. Seen by Pulmonary. CT chest; Increased Opacification Posterior Left lung concerning for Malignancy Currently on IV Heparin    Consultants: Dr, Curtis Freeman- Cardiology Dr. Ashby Freeman- Pulmonary   Objective: BP 147/86 mmHg  Pulse 95  Temp(Src) 98.3 F (36.8 C) (Oral)  Resp 18  Ht '5\' 10"'$  (1.778 m)  Wt 86.592 kg (190 lb 14.4 oz)  BMI 27.39 kg/m2  SpO2 95%  Intake/Output Summary (Last 24 hours) at 06/29/15 0829 Last data filed at 06/29/15 0727  Gross per 24 hour  Intake    570 ml  Output   2150 ml  Net  -1580 ml   Filed Weights   06/28/15 1641  Weight: 86.592 kg (190 lb 14.4 oz)    Exam:   General:  In mild distress  Cardiovascular: S1 S2   Respiratory: Decreased air entry Left Lung  Abdomen: Soft > Non tender  Neuro:Non Focal   Data Reviewed: Basic Metabolic Panel:  Recent Labs Lab 06/28/15 0544  NA 136  K 3.8  CL 99*  CO2 25  GLUCOSE 132*  BUN 17  CREATININE 1.17  CALCIUM 8.9   Liver Function Tests:  Recent Labs Lab 06/28/15 0544  AST 28  ALT 31  ALKPHOS 102  BILITOT 1.0  PROT 7.1  ALBUMIN 3.3*   No results for input(s): LIPASE, AMYLASE in the last 168 hours. No results for input(s): AMMONIA in the last 168 hours. CBC:  Recent Labs Lab 06/28/15 0544  WBC 9.0  HGB 14.5  HCT 44.0  MCV 83.0  PLT 261   Cardiac Enzymes:    Recent Labs Lab 06/28/15 0544  TROPONINI 0.06*   BNP (last 3 results) No results for input(s): BNP in the last 8760 hours.  ProBNP (last 3 results) No results for input(s): PROBNP in the last 8760 hours.  CBG: No results for input(s): GLUCAP in the last 168 hours.  No results found for this or any previous visit (from the past 240 hour(s)).   Studies: Ct Chest W Contrast  06/28/2015    CLINICAL DATA:  Hemoptysis beginning yesterday, history atrial fibrillation on Xarelto, COPD, pneumonia, hypertension, stroke  EXAM: CT CHEST WITH CONTRAST  TECHNIQUE: Multidetector CT imaging of the chest was performed during intravenous contrast administration. Sagittal and coronal MPR images reconstructed from axial data set.  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  Scattered atherosclerotic calcifications aorta and coronary arteries.  Pulmonary arteries grossly patent on nondedicated exam.  Enlarged RIGHT hilar lymph node 15 mm short axis image 30, increased.  Enlarged RIGHT peritracheal node 12 mm short axis image 24 previously 14 mm.  Additional scattered normal sized mediastinal and hilar nodes.  Thyroid gland in visualized inferior cervical region unremarkable.  Visualized upper abdomen normal appearance.  Minimal pericardial fluid.  Small LEFT pleural effusion increased since previous exam.  Persisting consolidation involving posterior LEFT upper lobe and superior segment LEFT lower lobe, extending to hilum.  Associated narrowing of LEFT lower lobe bronchi and scattered peribronchial thickening.  Malignancy not excluded.  Underlying emphysematous changes with scattered peripheral subpleural fibrosis in both lungs.  Increased nodule at medial RIGHT upper lobe 8 mm diameter image 9  previously 5 mm.  New RIGHT lower lobe nodular density 8 mm diameter image 44.  Stable tiny nodular density RIGHT lower lobe 3 mm diameter image 45.  New small LEFT lung nodules images 23, 24, 26, and 33.  Slightly increased size of tiny LEFT upper lobe nodule image 27.  No pneumothorax or acute osseous findings.  Small lipoma RIGHT latissimus dorsi.  IMPRESSION: Increased opacification of the posterior mid LEFT lung involving posterior LEFT upper lobe and superior segment of the LEFT lower lobe  extending to pulmonary hilum; the presence of progressive/non resolving pulmonary infiltrate is concerning for pulmonary neoplasm and bronchoscopy with tissue diagnosis is recommended.  Underlying emphysematous changes with increased peribronchial thickening in particularly LEFT lung since previous exam.  Multiple tiny pulmonary nodules, increasing or new since previous exam cannot exclude metastatic disease.  RIGHT paratracheal and increased RIGHT hilar adenopathy.   Electronically Signed   By: Curtis Freeman M.D.   On: 06/28/2015 18:13    Scheduled Meds: . atorvastatin  20 mg Oral Daily  . azithromycin  500 mg Intravenous Q24H  . cefTRIAXone (ROCEPHIN) IVPB 1 gram/50 mL D5W  1 g Intravenous Q24H  . hydrochlorothiazide  12.5 mg Oral Daily  . losartan  100 mg Oral Daily  . mometasone-formoterol  2 puff Inhalation BID    Continuous Infusions: . sodium chloride 50 mL/hr at 06/29/15 0600  . heparin 1,200 Units/hr (06/28/15 1950)    Assessment/Plan: Active Problems:  1 Hemoptysis secondary to left Lung Mass and consolidation concerning for Ca. For Bronchoscopy and biopsy later today 2 COPD/ Emphysema ; Stable- On SVN and Dulera 3 HTN: On Losartan and HCTZ 4 A-fib/ hx of previous CVA; Currently off Xarelto    Code Status: Full  Family Communication: Wife      Curtis Freeman   06/29/2015, 8:29 AM  LOS: 1 day

## 2015-06-29 NOTE — Transfer of Care (Signed)
Immediate Anesthesia Transfer of Care Note  Patient: Curtis Freeman  Procedure(s) Performed: Procedure(s): ENDOBRONCHIAL ULTRASOUND (Left)  Patient Location: PACU  Anesthesia Type:General  Level of Consciousness: awake, alert  and oriented  Airway & Oxygen Therapy: Patient Spontanous Breathing and Patient connected to face mask oxygen  Post-op Assessment: Report given to RN and Post -op Vital signs reviewed and stable  Post vital signs: Reviewed and stable  Last Vitals:  Filed Vitals:   06/29/15 1504  BP: 107/74  Pulse: 91  Temp: 37.1 C  Resp: 14    Complications: No apparent anesthesia complications   Patient is awake and neurologically intact!

## 2015-06-29 NOTE — Anesthesia Preprocedure Evaluation (Addendum)
Anesthesia Evaluation  Patient identified by MRN, date of birth, ID band Patient awake    Reviewed: Allergy & Precautions, H&P , NPO status , Patient's Chart, lab work & pertinent test results, reviewed documented beta blocker date and time   Airway Mallampati: II  TM Distance: >3 FB Neck ROM: full    Dental  (+) Chipped, Poor Dentition   Pulmonary sleep apnea , pneumonia -, COPDformer smoker,  breath sounds clear to auscultation  Pulmonary exam normal       Cardiovascular Exercise Tolerance: Poor hypertension, Normal cardiovascular examRhythm:regular Rate:Normal     Neuro/Psych CVA, Residual Symptoms negative psych ROS   GI/Hepatic negative GI ROS, Neg liver ROS,   Endo/Other  negative endocrine ROS  Renal/GU negative Renal ROS  negative genitourinary   Musculoskeletal   Abdominal   Peds  Hematology negative hematology ROS (+)   Anesthesia Other Findings Past Medical History:   Hypertension                                                 Pneumonia                                                    Cancer                                                         Comment:lung mass   Stroke                                                       COPD (chronic obstructive pulmonary disease)                 Reproductive/Obstetrics negative OB ROS                             Anesthesia Physical Anesthesia Plan  ASA: IV  Anesthesia Plan: General LMA   Post-op Pain Management:    Induction:   Airway Management Planned:   Additional Equipment:   Intra-op Plan:   Post-operative Plan:   Informed Consent: I have reviewed the patients History and Physical, chart, labs and discussed the procedure including the risks, benefits and alternatives for the proposed anesthesia with the patient or authorized representative who has indicated his/her understanding and acceptance.   Dental Advisory  Given  Plan Discussed with: Anesthesiologist, CRNA and Surgeon  Anesthesia Plan Comments: (Patient consented in procedure room, that is why this pre operative note is being done post induction.  Patient told that he is higher risk for intra operative CVA 2/2 his history.)        Anesthesia Quick Evaluation

## 2015-06-29 NOTE — Care Management Note (Signed)
Case Management Note  Patient Details  Name: Curtis Freeman MRN: [id number] Date of Birth: 12-08-1941  Subjective/Objective:                   Spoke with the spouse as patient was out of room for procedure. Patient is independent at home with no equipment and she stated that he drives self. Expressed no needs at this time. Will continue to follow for changes to discharge plan. Action/Plan:   Expected Discharge Date:  06/29/15               Expected Discharge Plan:  Home/Self Care  In-House Referral:     Discharge planning Services  CM Consult  Post Acute Care Choice:    Choice offered to:     DME Arranged:    DME Agency:     HH Arranged:    HH Agency:     Status of Service:     Medicare Important Message Given:    Date Medicare IM Given:    Medicare IM give by:    Date Additional Medicare IM Given:    Additional Medicare Important Message give by:     If discussed at [place] of Stay Meetings, dates discussed:    Additional Comments:  Alvie Heidelberg, RN 06/29/2015, 2:26 PM

## 2015-06-29 NOTE — Progress Notes (Signed)
ANTICOAGULATION CONSULT NOTE - Follow Up Consult  Pharmacy Consult for Heparin Indication: atrial fibrillation  No Known Allergies  Patient Measurements: Height: '5\' 10"'$  (177.8 cm) Weight: 190 lb 14.4 oz (86.592 kg) IBW/kg (Calculated) : 73 Heparin Dosing Weight: 86.6 kg  Vital Signs: Temp: 97.8 F (36.6 C) (07/21 1618) Temp Source: Oral (07/21 1618) BP: 105/75 mmHg (07/21 1618) Pulse Rate: 88 (07/21 1618)  Labs:  Recent Labs  06/28/15 0544 06/28/15 1658 06/29/15 0135  HGB 14.5  --   --   HCT 44.0  --   --   PLT 261  --   --   APTT  --  39* 46*  LABPROT  --  16.1*  --   INR  --  1.27  --   HEPARINUNFRC  --  1.44* 1.70*  CREATININE 1.17  --   --   TROPONINI 0.06*  --   --     Estimated Creatinine Clearance: 56.3 mL/min (by C-G formula based on Cr of 1.17).   Medications:  Scheduled:  . atorvastatin  20 mg Oral Daily  . azithromycin  500 mg Intravenous Q24H  . cefTRIAXone (ROCEPHIN) IVPB 1 gram/50 mL D5W  1 g Intravenous Q24H  . hydrochlorothiazide  12.5 mg Oral Daily  . losartan  100 mg Oral Daily  . mometasone-formoterol  2 puff Inhalation BID   Infusions:  . sodium chloride 50 mL/hr at 06/29/15 0600  . heparin     PRN: albuterol, fentaNYL (SUBLIMAZE) injection, oxyCODONE **OR** oxyCODONE, zolpidem  Assessment: Patient on rivaroxaban PTA with afib ordered and scheduled bronchoscopy 7/21 to convert to heparin drip. Last dose of rivaroxaban was 7/19 PM. Patient now s/p bronchoscopy to resume heparin at 2000.   Goal of Therapy:  Heparin level 0.3-0.7 units/ml  Or aPTT 68-109 Monitor platelets by anticoagulation protocol: Yes   Plan:  Will order heparin 1200 units/hr to begin at 2000. Will check aPTT and HL 8 hours after resuming heparin drip. Once HL and aPTT correlate, will begin dosing by HL only.   Ulice Dash D 06/29/2015,5:48 PM

## 2015-06-29 NOTE — Progress Notes (Signed)
Initial Nutrition Assessment  DOCUMENTATION CODES:   Severe malnutrition in context of chronic illness  INTERVENTION:   Meals and snacks: Cater to pt preferences once diet progressed Nutrition Supplement Therapy: Once diet progressed recommend adding ensure/mightyshake supplement for added nutrition  NUTRITION DIAGNOSIS:   Inadequate oral intake related to poor appetite as evidenced by per patient/family report.    GOAL:   Patient will meet greater than or equal to 90% of their needs    MONITOR:    (Energy intake, Anthropometrics, )  REASON FOR ASSESSMENT:   Malnutrition Screening Tool    ASSESSMENT:   Pt admitted with hemoptysis, elevated troponin, lung mass.  Planning bronch and biopsy later today  Past Medical History  Diagnosis Date  . Hypertension   . Pneumonia   . Cancer     lung mass  . Stroke   . COPD (chronic obstructive pulmonary disease)     Current Nutrition: NPO, ate 100% of supper last night  Food/Nutrition-Related History: Pt reports since the end of April decreased intake secondary to having stroke, being in rehab   Medications: NS at 41m/hr  Electrolyte/Renal Profile and Glucose Profile:   Recent Labs Lab 06/28/15 0544  NA 136  K 3.8  CL 99*  CO2 25  BUN 17  CREATININE 1.17  CALCIUM 8.9  GLUCOSE 132*   Protein Profile:  Recent Labs Lab 06/28/15 0544  ALBUMIN 3.3*     Last BM:7/19   Nutrition-Focused Physical Exam Findings: Nutrition-Focused physical exam completed. Findings are WDL for fat depletion, muscle depletion, and edema.     Weight Change: Pt reports weight loss of 20 pounds in the last 3 months (10% weight loss)    Diet Order:  Diet NPO time specified  Skin:  Reviewed, no issues   Height:   Ht Readings from Last 1 Encounters:  06/28/15 '5\' 10"'$  (1.778 m)    Weight:   Wt Readings from Last 1 Encounters:  06/28/15 190 lb 14.4 oz (86.592 kg)    Ideal Body Weight:     Wt Readings from Last 10  Encounters:  06/28/15 190 lb 14.4 oz (86.592 kg)    BMI:  Body mass index is 27.39 kg/(m^2).  Estimated Nutritional Needs:   Kcal:  BEE 1601 kcals (IF 1.0-1.2, AF 1.3) 23845-3646kcals/d.   Protein:  (1.2-1.5 g/kg) 103-129 g/d  Fluid:  (30-366mkg) 2580-301042m  EDUCATION NEEDS:   No education needs    HIGH Care Level Monay Houlton B. AllZenia ResidesD,[place] Springs[place])

## 2015-06-29 NOTE — Anesthesia Procedure Notes (Signed)
Procedure Name: LMA Insertion Date/Time: 06/29/2015 1:50 PM Performed by: Aline Brochure Pre-anesthesia Checklist: Patient identified, Timeout performed, Emergency Drugs available, Suction available and Patient being monitored Patient Re-evaluated:Patient Re-evaluated prior to inductionOxygen Delivery Method: Circle system utilized Preoxygenation: Pre-oxygenation with 100% oxygen Intubation Type: IV induction Ventilation: Mask ventilation without difficulty LMA: LMA inserted LMA Size: 4.5 Number of attempts: 1 Airway Equipment and Method: Patient positioned with wedge pillow Placement Confirmation: positive ETCO2 Tube secured with: Tape Dental Injury: Teeth and Oropharynx as per pre-operative assessment

## 2015-06-29 NOTE — Procedures (Addendum)
PCCM Bronchoscopy Procedure Note  FINDINGS/SUMARY:  -Bronch completed, no endobronchial lesion; EBUS biopsies taken of subcarinal and right paratracheal nodes, washes and endobronchial biopsies taken at [facility], washes, brushings taken at [facility].  -Per cytopathologist, initial review showed atypical squamous cells in 4R paratracheal node, full pathology report pending.  -Mild 60% narrowing at entry to LUL and left bronchus intermedius due to external compression. Abnormal mucosa noted at [facility], endobronchial biopsies taken.  -Mild mucosal secretions and mild mucosal inflammation throughout both lungs which was more pronounced in the LUL and left bronchus intermedius.    The patient was informed of the risks (including but not limited to bleeding, infection, respiratory failure, lung injury, tooth/oral injury) and benefits of the procedure and gave consent, see chart.  Indication: Lung mass Pre-op diagnosis: Lung mass.  Post-op: same.   Location: [facility]  Condition pre procedure: Good  Medications for procedure: Per anesthesia  Procedure description: The bronchoscope was introduced through the LMA and passed to the bilateral lungs to the level of the subsegmental bronchi throughout the tracheobronchial tree.  Airway exam revealed 60% narrowing due to external compression at LUL and left BI.   Procedures performed:  EBUS guided sampling of 4R node times 2 and 7 node times 2 both with good returns.  Washing/BAL at L[facility], [facility].  Cyto-brush at [facility].  Endobronchial forceps biopsies of abnormal mucosa seen in at [facility].   Specimens sent: as above.   Condition post procedure: Good.     Complications: None  Patient instructions: Back to inpatient room.   Curtis Stalker, MD [facility] Pager [phone]

## 2015-06-29 NOTE — Progress Notes (Signed)
[facility] Encounter Note  Patient: Curtis Freeman / Admit Date: 06/28/2015 / Date of Encounter: 06/29/2015, 8:30 AM   Subjective: No significant cardiac symptoms. Heart rate seems to be relatively controlled  Review of Systems: Positive for: Some mild shortness of breath Negative for: Vision change, hearing change, syncope, dizziness, nausea, vomiting,diarrhea, bloody stool, stomach pain, cough, congestion, diaphoresis, urinary frequency, urinary pain,skin lesions, skin rashes Others previously listed  Objective: Telemetry: Atrial fibrillation with variable rate Physical Exam: Blood pressure 147/86, pulse 95, temperature 98.3 F (36.8 C), temperature source Oral, resp. rate 18, height '5\' 10"'$  (1.778 m), weight 190 lb 14.4 oz (86.592 kg), SpO2 95 %. Body mass index is 27.39 kg/(m^2). General: Well developed, well nourished, in no acute distress. Head: Normocephalic, atraumatic, sclera non-icteric, no xanthomas, nares are without discharge. Neck: No apparent masses Lungs: Normal respirations with no wheezes, few rhonchi, no rales , basilar crackles   Heart: Irregular rate and rhythm, normal S1 S2, no murmur, no rub, no gallop, PMI is normal size and placement, carotid upstroke normal without bruit, jugular venous pressure normal Abdomen: Soft, non-tender, non-distended with normoactive bowel sounds. No hepatosplenomegaly. Abdominal aorta is normal size without bruit Extremities: No edema, no clubbing, no cyanosis, no ulcers,  Peripheral: 2+ radial, 2+ femoral, 2+ dorsal pedal pulses Neuro: Alert and oriented. Moves all extremities spontaneously. Psych:  Responds to questions appropriately with a normal affect.   Intake/Output Summary (Last 24 hours) at 06/29/15 0830 Last data filed at 06/29/15 1937  Gross per 24 hour  Intake    570 ml  Output   2150 ml  Net  -1580 ml    Inpatient Medications:  . atorvastatin  20 mg Oral Daily  . azithromycin  500 mg  Intravenous Q24H  . cefTRIAXone (ROCEPHIN) IVPB 1 gram/50 mL D5W  1 g Intravenous Q24H  . hydrochlorothiazide  12.5 mg Oral Daily  . losartan  100 mg Oral Daily  . mometasone-formoterol  2 puff Inhalation BID   Infusions:  . sodium chloride 50 mL/hr at 06/29/15 0600  . heparin 1,200 Units/hr (06/28/15 1950)    Labs:  Recent Labs  06/28/15 0544  NA 136  K 3.8  CL 99*  CO2 25  GLUCOSE 132*  BUN 17  CREATININE 1.17  CALCIUM 8.9    Recent Labs  06/28/15 0544  AST 28  ALT 31  ALKPHOS 102  BILITOT 1.0  PROT 7.1  ALBUMIN 3.3*    Recent Labs  06/28/15 0544  WBC 9.0  HGB 14.5  HCT 44.0  MCV 83.0  PLT 261    Recent Labs  06/28/15 0544  TROPONINI 0.06*   Invalid input(s): POCBNP No results for input(s): HGBA1C in the last 72 hours.   Weights: Filed Weights   06/28/15 1641  Weight: 190 lb 14.4 oz (86.592 kg)     Radiology/Studies:  Ct Chest W Contrast  06/28/2015   CLINICAL DATA:  Hemoptysis beginning yesterday, history atrial fibrillation on Xarelto, COPD, pneumonia, hypertension, stroke  EXAM: CT CHEST WITH CONTRAST  TECHNIQUE: Multidetector CT imaging of the chest was performed during intravenous contrast administration. Sagittal and coronal MPR images reconstructed from axial data set.  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  Scattered atherosclerotic calcifications aorta and coronary arteries.  Pulmonary arteries grossly patent on nondedicated exam.  Enlarged RIGHT hilar lymph node 15 mm short  axis image 30, increased.  Enlarged RIGHT peritracheal node 12 mm short axis image 24 previously 14 mm.  Additional scattered normal sized mediastinal and hilar nodes.  Thyroid gland in visualized inferior cervical region unremarkable.  Visualized upper abdomen normal appearance.  Minimal pericardial fluid.  Small LEFT pleural effusion  increased since previous exam.  Persisting consolidation involving posterior LEFT upper lobe and superior segment LEFT lower lobe, extending to hilum.  Associated narrowing of LEFT lower lobe bronchi and scattered peribronchial thickening.  Malignancy not excluded.  Underlying emphysematous changes with scattered peripheral subpleural fibrosis in both lungs.  Increased nodule at medial RIGHT upper lobe 8 mm diameter image 9 previously 5 mm.  New RIGHT lower lobe nodular density 8 mm diameter image 44.  Stable tiny nodular density RIGHT lower lobe 3 mm diameter image 45.  New small LEFT lung nodules images 23, 24, 26, and 33.  Slightly increased size of tiny LEFT upper lobe nodule image 27.  No pneumothorax or acute osseous findings.  Small lipoma RIGHT latissimus dorsi.  IMPRESSION: Increased opacification of the posterior mid LEFT lung involving posterior LEFT upper lobe and superior segment of the LEFT lower lobe extending to pulmonary hilum; the presence of progressive/non resolving pulmonary infiltrate is concerning for pulmonary neoplasm and bronchoscopy with tissue diagnosis is recommended.  Underlying emphysematous changes with increased peribronchial thickening in particularly LEFT lung since previous exam.  Multiple tiny pulmonary nodules, increasing or new since previous exam cannot exclude metastatic disease.  RIGHT paratracheal and increased RIGHT hilar adenopathy.   Electronically Signed   By: Lavonia Dana M.D.   On: 06/28/2015 18:13   Dg Chest Portable 1 View  06/28/2015   CLINICAL DATA:  Acute onset of hemoptysis. Atrial fibrillation. Initial encounter.  EXAM: PORTABLE CHEST - 1 VIEW  COMPARISON:  Chest radiograph performed 04/21/2015, and CT of the chest performed 04/25/2015  FINDINGS: There is increasing left perihilar airspace opacification, which may reflect a chronic atypical infection, given hemoptysis and gradual worsening from the prior study. Mild patchy right basilar airspace opacity is  noted. No definite pleural effusion or pneumothorax is seen.  The cardiomediastinal silhouette is borderline normal in size. No acute osseous abnormalities are identified.  IMPRESSION: Increasing left perihilar airspace opacification, which may reflect a chronic atypical infection or possibly an unusual malignancy, given hemoptysis and gradual worsening from the prior study. Mild patchy right basilar airspace opacity seen. Pulmonary consultation was recommended per the prior CT, for possible bronchoscopy and tissue sampling.   Electronically Signed   By: Garald Balding M.D.   On: 06/28/2015 06:50     Assessment and Recommendation  74 y.o. 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 Patient does not have anginal symptoms and/or heart failure at this time and at low risk for cardiovascular complication with bronchoscopy and biopsy 1. Heparin for further risk reduction in stroke with atrial fibrillation and bridge through procedure redo stroke risk 2. No evidence of need for additional medication management for heart rate control of atrial fibrillation which is stable 3. No further cardiac diagnostics necessary at this time 4. Call if further questions  Signed, Serafina Royals M.D. FACC

## 2015-06-29 NOTE — Progress Notes (Signed)
PULMONARY/CRITICAL CARE PROGRESS NOTE:  Pneumonia --Consolidation/mass in superior segment of LLL as well as mediastinal/hilar lymphadenopathy.  --Repeat CT chest images and report reviewed, discussed with family, continued consolidation/mass and lymphadenopathy.  --Continue abx.   Lung mass --Suspect lung ca vs. Infection.  --Ebus bronchoscopy tentatively planned for tomorrow.   Other emphysema --Severe emphysema, currently adequately controlled.  --Severe chronic bronchitis with chronic expectoration and cough.  --Continue advair/spiriva.   Pulmonary fibrosis --Currently has some sub-pleural fibrosis seen on CT chest, this appears mild.  Obstructive sleep apnea --Continue home CPAP, asked family to bring in his home unit for use here.   Stroke --History of stroke in April after being off of xarelto for one day.    Atrial fibrillation --Now rate controlled.  --Discussed with RN, will restart heparin after bronch.       Date: 06/28/2015  MRN# [id number] Curtis Freeman 03-31-1941     CC:  Chief Complaint  Patient presents with  . Hemoptysis  . Atrial Fibrillation    Interim History:  Patient feeling better.     ECHO Mildly reduced LV systolic function EF 93-23% , Moderate TR, Moderate MR, Mild LVH, Mod Pulmonary HTN Sleeps -poorly.   PMHX:   Past Medical History  Diagnosis Date  . Hypertension   . Pneumonia   . Cancer     lung mass  . Stroke   . COPD (chronic obstructive pulmonary disease)    Surgical Hx:  History reviewed. No pertinent past surgical history. Family Hx:  History reviewed. No pertinent family history. Social Hx:   History  Substance Use Topics  . Smoking status: Former Smoker    Quit date: 06/27/2005  . Smokeless tobacco: Not on file  . Alcohol Use: Not on file   Medication:   No current outpatient prescriptions on file.  Currently meds reviewed.   Allergies:  Review of patient's allergies indicates no known  allergies.  Review of Systems: Gen:  Denies  fever, sweats, chills HEENT: Denies blurred vision, double vision, ear pain, eye pain, hearing loss, nose bleeds, sore throat Cvc:  No dizziness, chest pain or heaviness Resp:   Denies cough or sputum porduction, shortness of breath Gi: Denies swallowing difficulty, stomach pain, nausea or vomiting, diarrhea, constipation, bowel incontinence Gu:  Denies bladder incontinence, burning urine Ext:   No Joint pain, stiffness or swelling Skin: No skin rash, easy bruising or bleeding or hives Endoc:  No polyuria, polydipsia , polyphagia or weight change Psych: No depression, insomnia or hallucinations  Other:  All other systems negative  Physical Examination:   VS: BP 120/88 mmHg  Pulse 85  Temp(Src) 98.4 F (36.9 C) (Oral)  Resp 24  SpO2 91%  General Appearance: No distress  Neuro:without focal findings, mental status, speech normal, alert and oriented, cranial nerves 2-12 intact, reflexes normal and symmetric, sensation grossly normal  HEENT: PERRLA, EOM intact, no ptosis, no other lesions noticed Pulmonary: normal breath sounds., diaphragmatic excursion normal.No wheezing, No rales;   Sputum Production:   CardiovascularNormal S1,S2.  No m/r/g.  Abdominal aorta pulsation normal.    Abdomen: Benign, Soft, non-tender, No masses, hepatosplenomegaly, No lymphadenopathy Renal:  No costovertebral tenderness  GU:  No performed at this time. Endoc: No evident thyromegaly, no signs of acromegaly or Cushing features Skin:   warm, no rashes, no ecchymosis  Extremities: normal, no cyanosis, clubbing, no edema, warm with normal capillary refill. Other findings:   Labs results: Reviewed.  Rad results: CT images and report reviewed: There  is lymphadenopathy  Noted in 4R, 4L, subcarinal areas. There is consolidation of the superior segment of the LLL.     Thank  you for the consultation and for allowing [facility] to assist in the  care of your patient. Our recommendations are noted above.  Please contact us if we can be of further service.   Marda Stalker, MD [facility] Pager [phone]

## 2015-06-30 ENCOUNTER — Inpatient Hospital Stay: Payer: Medicare Other

## 2015-06-30 DIAGNOSIS — E43 Unspecified severe protein-calorie malnutrition: Secondary | ICD-10-CM | POA: Insufficient documentation

## 2015-06-30 LAB — CBC
HCT: 41.7 % (ref 40.0–52.0)
Hemoglobin: 14 g/dL (ref 13.0–18.0)
MCH: 27.7 pg (ref 26.0–34.0)
MCHC: 33.6 g/dL (ref 32.0–36.0)
MCV: 82.7 fL (ref 80.0–100.0)
PLATELETS: 245 10*3/uL (ref 150–440)
RBC: 5.04 MIL/uL (ref 4.40–5.90)
RDW: 14.9 % — AB (ref 11.5–14.5)
WBC: 7.2 10*3/uL (ref 3.8–10.6)

## 2015-06-30 LAB — CYTOLOGY - NON PAP

## 2015-06-30 LAB — HEPARIN LEVEL (UNFRACTIONATED): HEPARIN UNFRACTIONATED: 0.55 [IU]/mL (ref 0.30–0.70)

## 2015-06-30 LAB — APTT
aPTT: 53 seconds — ABNORMAL HIGH (ref 24–36)
aPTT: 29 seconds (ref 24–36)

## 2015-06-30 MED ORDER — ALBUTEROL SULFATE (2.5 MG/3ML) 0.083% IN NEBU: 2.5000 mg | INHALATION_SOLUTION | RESPIRATORY_TRACT | Status: AC | PRN

## 2015-06-30 MED ORDER — CEFUROXIME AXETIL 250 MG PO TABS: 250.0000 mg | ORAL_TABLET | Freq: Two times a day (BID) | ORAL | Status: DC

## 2015-06-30 MED ORDER — DEXTROSE 5 % IV SOLN: 500.0000 mg | INTRAVENOUS | Status: DC

## 2015-06-30 MED ORDER — AZITHROMYCIN 250 MG PO TABS: ORAL_TABLET | ORAL | Status: DC

## 2015-06-30 NOTE — Anesthesia Postprocedure Evaluation (Signed)
  Anesthesia Post-op Note  Patient: Curtis Freeman  Procedure(s) Performed: Procedure(s): ENDOBRONCHIAL ULTRASOUND (Left)  Anesthesia type:General LMA  Patient location: PACU  Post pain: Pain level controlled  Post assessment: Post-op Vital signs reviewed, Patient's Cardiovascular Status Stable, Respiratory Function Stable, Patent Airway and No signs of Nausea or vomiting  Post vital signs: Reviewed and stable  Last Vitals:  Filed Vitals:   06/30/15 0006  BP: 109/67  Pulse: 97  Temp: 36.7 C  Resp: 16    Level of consciousness: awake, alert  and patient cooperative  Complications: No apparent anesthesia complications

## 2015-06-30 NOTE — Assessment & Plan Note (Signed)
--  Continue LABA/LAMA.  --Outpatient PFT and further followup.

## 2015-06-30 NOTE — Progress Notes (Signed)
   06/30/15 1100  Clinical Encounter Type  Visited With Patient;Family  Visit Type Initial  Referral From Nurse  Stress Factors  Patient Stress Factors Health changes  Family Stress Factors Health changes  Chaplain Marcello Moores engaged wife of patient. Patient appeared sleep. Offered prayer and support.

## 2015-06-30 NOTE — Assessment & Plan Note (Signed)
--  Agree with empiric abx.

## 2015-06-30 NOTE — Assessment & Plan Note (Signed)
--  High risk for CVA, on xarelto.  --OK to restart xarelto from pulmonary standpoint.

## 2015-06-30 NOTE — Progress Notes (Signed)
PROGRESS NOTE  IZEK CORVINO ZOX:[id number] DOB: 06/08/1941 DOA: 06/28/2015 PCP: Dr. Ginette Pitman  Subjective: 74  Y/o m with hx of A-fib, COPD, HTN, previous CVA, Pneumonia admitted with hemoptysis. Seen by Pulmonary- Underwent Bronch anf EBUS biopsies of Sub carinal and Rt Paratracheal lymph nodes, washings and brushings  CT chest; Increased Opacification Posterior Left lung concerning for Malignancy Currently on IV Heparin    Consultants: Dr, Nehemiah Massed- Cardiology Dr. Ashby Dawes- Pulmonary   Objective: BP 109/67 mmHg  Pulse 97  Temp(Src) 98.1 F (36.7 C) (Oral)  Resp 16  Ht '5\' 10"'$  (1.778 m)  Wt 86.592 kg (190 lb 14.4 oz)  BMI 27.39 kg/m2  SpO2 93%  Intake/Output Summary (Last 24 hours) at 06/30/15 0814 Last data filed at 06/30/15 0601  Gross per 24 hour  Intake 1879.51 ml  Output    800 ml  Net 1079.51 ml   Filed Weights   06/28/15 1641  Weight: 86.592 kg (190 lb 14.4 oz)    Exam:   General:  Not in distress  Cardiovascular: S1 S2   Respiratory: Decreased air entry Left Lung  Abdomen: Soft. Non tender  Neuro:Non Focal   Data Reviewed: Basic Metabolic Panel:  Recent Labs Lab 06/28/15 0544  NA 136  K 3.8  CL 99*  CO2 25  GLUCOSE 132*  BUN 17  CREATININE 1.17  CALCIUM 8.9   Liver Function Tests:  Recent Labs Lab 06/28/15 0544  AST 28  ALT 31  ALKPHOS 102  BILITOT 1.0  PROT 7.1  ALBUMIN 3.3*   No results for input(s): LIPASE, AMYLASE in the last 168 hours. No results for input(s): AMMONIA in the last 168 hours. CBC:  Recent Labs Lab 06/28/15 0544 06/30/15 0542  WBC 9.0 7.2  HGB 14.5 14.0  HCT 44.0 41.7  MCV 83.0 82.7  PLT 261 245   Cardiac Enzymes:    Recent Labs Lab 06/28/15 0544  TROPONINI 0.06*   BNP (last 3 results) No results for input(s): BNP in the last 8760 hours.  ProBNP (last 3 results) No results for input(s): PROBNP in the last 8760 hours.  CBG: No results for input(s): GLUCAP in the last 168  hours.  Recent Results (from the past 240 hour(s))  Culture, sputum-assessment     Status: None   Collection Time: 06/28/15  5:50 PM  Result Value Ref Range Status   Specimen Description EXPECTORATED SPUTUM  Final   Special Requests NONE  Final   Sputum evaluation   Final    Sputum specimen not acceptable for testing.  Please recollect.   Results Called to: [facility] AT 2125 BY TB    Report Status 06/29/2015 FINAL  Final  Body fluid culture     Status: None (Preliminary result)   Collection Time: 06/29/15  4:04 PM  Result Value Ref Range Status   Specimen Description BRONCHIAL WASHINGS  Final   Special Requests LUL  Final   Gram Stain PENDING  Incomplete   Culture TOO YOUNG TO READ  Final   Report Status PENDING  Incomplete  Body fluid culture     Status: None (Preliminary result)   Collection Time: 06/29/15  4:04 PM  Result Value Ref Range Status   Specimen Description BRONCHIAL WASHINGS  Final   Special Requests LLL  Final   Gram Stain PENDING  Incomplete   Culture NO GROWTH < 24 HOURS  Final   Report Status PENDING  Incomplete  Culture, fungus without smear     Status:  None (Preliminary result)   Collection Time: 06/29/15  4:04 PM  Result Value Ref Range Status   Specimen Description BRONCHIAL WASHINGS  Final   Special Requests LLL  Final   Culture NO GROWTH < 24 HOURS  Final   Report Status PENDING  Incomplete  Culture, fungus without smear     Status: None (Preliminary result)   Collection Time: 06/29/15  4:04 PM  Result Value Ref Range Status   Specimen Description BRONCHIAL WASHINGS  Final   Special Requests LUL  Final   Culture NO GROWTH < 24 HOURS  Final   Report Status PENDING  Incomplete     Studies: Dg Chest 1 View  06/30/2015   CLINICAL DATA:  Pneumonia, emphysema and pulmonary fibrosis, left-sided lung mass  EXAM: CHEST  1 VIEW  COMPARISON:  CT scan of the chest of June 28, 2015 and chest x-ray of the same day  FINDINGS: There is a persistent left  perihilar masslike density. The lungs are well-expanded. The interstitial markings remain increased bilaterally. The heart is normal in size. The pulmonary vascularity is not engorged. The trachea is midline. The bony thorax exhibits no acute abnormality.  IMPRESSION: Stable appearance of the chest since yesterday's study with persistent left hilar masslike density with known parenchymal abnormality in the left upper and lower lobes. COPD and pulmonary fibrosis.   Electronically Signed   By: David  Martinique M.D.   On: 06/30/2015 07:38    Scheduled Meds: . atorvastatin  20 mg Oral Daily  . azithromycin  500 mg Intravenous Q24H  . cefTRIAXone (ROCEPHIN) IVPB 1 gram/50 mL D5W  1 g Intravenous Q24H  . hydrochlorothiazide  12.5 mg Oral Daily  . losartan  100 mg Oral Daily  . mometasone-formoterol  2 puff Inhalation BID    Continuous Infusions: . sodium chloride 50 mL/hr at 06/29/15 0600  . heparin 1,200 Units/hr (06/29/15 2128)    Assessment/Plan: Active Problems:  1 Hemoptysis secondary to left Lung Mass and consolidation concerning for Ca. Underwent Bronchoscopy with washings, brushings and EBUS biopsies Awaiting results  2 COPD/ Emphysema ; Stable- On SVN and Dulera 3 HTN: On Losartan and HCTZ 4 A-fib/ hx of previous CVA; D/w Dr. Nehemiah Massed- Restart Xarelto as out pt Ambulate today and wean O2 . Possible d/c later this after noon    Code Status: Full  Family Communication: Wife      Azael Ragain   06/30/2015, 8:14 AM  LOS: 2 days

## 2015-06-30 NOTE — Assessment & Plan Note (Signed)
--  on PAP.

## 2015-06-30 NOTE — Care Management Important Message (Signed)
Important Message  Patient Details  Name: Curtis Freeman MRN: [id number] Date of Birth: 14-Mar-1941   Medicare Important Message Given:  Yes-second notification given    Darius Bump Allmond 06/30/2015, 9:39 AM

## 2015-06-30 NOTE — Progress Notes (Addendum)
PULMONARY MEDICINE: PROGRESS NOTE   ASSESSMENT / PLAN: Pneumonia --Consolidation/mass in superior segment of LLL as well as mediastinal/hilar lymphadenopathy.  --Discussed with patient and family, will repeat CT scan and plan for bronchoscopy if there is still evidence of consolidation.  --Continue abx.   Lung mass --Suspect lung ca vs. Infection.  --Ebus bronchoscopy tentatively planned for tomorrow.   Other emphysema --Severe emphysema, currently adequately controlled.  --Severe chronic bronchitis with chronic expectoration and cough.  --Continue advair/spiriva.   Pulmonary fibrosis --Currently has some sub-pleural fibrosis seen on CT chest, this appears mild.  Obstructive sleep apnea --Continue home CPAP, asked family to bring in his home unit for use here.   Stroke --History of stroke in April after being off of xarelto for one day.    Atrial fibrillation --Now rate controlled.  --Given high stroke risk, will anticoagulation with heparin, for bridging before bronchoscopy.   Lung mass --LLL mass, s/p bronch. Prelim results suggestive of atypical squamous cells, official cytology  pending.  --Currently on abx, agree with continuing this empirically.     Obstructive sleep apnea --on PAP.   Other emphysema --Continue LABA/LAMA.  --Outpatient PFT and further followup.   Pneumonia --Agree with empiric abx.   Pulmonary fibrosis --Fibrosis seen on CT chest, appears mild.    Stroke --Thromboembolic CVA, stable with mild residual symptoms.   Atrial fibrillation --High risk for CVA, on xarelto.  --OK to restart xarelto from pulmonary standpoint.    Discussed with Dr. Ginette Pitman, will have patient follow up with me on Wednesday am of next week for results and further testing if needed. I have instructed my office to contact the patient on Monday for follow up appt.     Name: Curtis Freeman MRN: [id number] DOB: 03-07-1941    ADMISSION DATE:  06/28/2015    CHIEF  COMPLAINT:  Dyspnea, hemoptysis      SUBJECTIVE:   Pt feels well, hemoptysis has resolved.   Review of Systems:  Constitutional: Feels well. Cardiovascular: No chest pain.  Pulmonary: Denies dyspnea.   The remainder of systems were reviewed and were found to be negative other than what is documented in the HPI.    VITAL SIGNS: Temp:  [97.8 F (36.6 C)-98.8 F (37.1 C)] 98.3 F (36.8 C) (07/22 0820) Pulse Rate:  [40-136] 114 (07/22 0820) Resp:  [14-22] 19 (07/22 0820) BP: (93-132)/(64-92) 132/92 mmHg (07/22 0820) SpO2:  [91 %-97 %] 95 % (07/22 1004) FiO2 (%):  [28 %] 28 % (07/21 1504) HEMODYNAMICS:   VENTILATOR SETTINGS: Vent Mode:  [-]  FiO2 (%):  [28 %] 28 % INTAKE / OUTPUT:  Intake/Output Summary (Last 24 hours) at 06/30/15 1407 Last data filed at 06/30/15 0820  Gross per 24 hour  Intake   1980 ml  Output    900 ml  Net   1080 ml    PHYSICAL EXAMINATION: Physical Examination:   VS: BP 132/92 mmHg  Pulse 114  Temp(Src) 98.3 F (36.8 C) (Oral)  Resp 19  Ht '5\' 10"'$  (1.778 m)  Wt 86.592 kg (190 lb 14.4 oz)  BMI 27.39 kg/m2  SpO2 95%  General Appearance: No distress  Neuro:without focal findings, mental status normal. HEENT: PERRLA, EOM intact. Pulmonary: normal breath sounds   CardiovascularNormal S1,S2.  No m/r/g.   Abdomen: Benign, Soft, non-tender. Renal:  No costovertebral tenderness  GU:  Not performed at this time. Endocrine: No evident thyromegaly. Skin:   warm, no rashes, no ecchymosis  Extremities: normal, no cyanosis, clubbing.  LABS:  CBC  Recent Labs Lab 06/28/15 0544 06/30/15 0542  WBC 9.0 7.2  HGB 14.5 14.0  HCT 44.0 41.7  PLT 261 245   Coag's  Recent Labs Lab 06/28/15 1658 06/29/15 0135 06/30/15 0542 06/30/15 1222  APTT 39* 46* 53* 29  INR 1.27  --   --   --    BMET  Recent Labs Lab 06/28/15 0544  NA 136  K 3.8  CL 99*  CO2 25  BUN 17  CREATININE 1.17  GLUCOSE 132*   Electrolytes  Recent Labs Lab  06/28/15 0544  CALCIUM 8.9   Sepsis Markers No results for input(s): LATICACIDVEN, PROCALCITON, O2SATVEN in the last 168 hours. ABG No results for input(s): PHART, PCO2ART, PO2ART in the last 168 hours. Liver Enzymes  Recent Labs Lab 06/28/15 0544  AST 28  ALT 31  ALKPHOS 102  BILITOT 1.0  ALBUMIN 3.3*   Cardiac Enzymes  Recent Labs Lab 06/28/15 0544  TROPONINI 0.06*   Glucose No results for input(s): GLUCAP in the last 168 hours.  Imaging Dg Chest 1 View  06/30/2015   CLINICAL DATA:  Pneumonia, emphysema and pulmonary fibrosis, left-sided lung mass  EXAM: CHEST  1 VIEW  COMPARISON:  CT scan of the chest of June 28, 2015 and chest x-ray of the same day  FINDINGS: There is a persistent left perihilar masslike density. The lungs are well-expanded. The interstitial markings remain increased bilaterally. The heart is normal in size. The pulmonary vascularity is not engorged. The trachea is midline. The bony thorax exhibits no acute abnormality.  IMPRESSION: Stable appearance of the chest since yesterday's study with persistent left hilar masslike density with known parenchymal abnormality in the left upper and lower lobes. COPD and pulmonary fibrosis.   Electronically Signed   By: David  Martinique M.D.   On: 06/30/2015 07:38       Deep Ashby Dawes, MD [facility] Pager - [phone]

## 2015-06-30 NOTE — Progress Notes (Signed)
SATURATION QUALIFICATIONS: (This note is used to comply with regulatory documentation for home oxygen)  Patient Saturations on Room Air at Rest = 95%  Patient Saturations on Room Air while Ambulating = 99%

## 2015-06-30 NOTE — Assessment & Plan Note (Signed)
--  Thromboembolic CVA, stable with mild residual symptoms.

## 2015-06-30 NOTE — Care Management (Signed)
Asked nurse  Raquel Sarna to do qualifying saturations. Nurse stated that the patient was 95% with ambulation on room air. Would not qualify for home O2.  No other CM  discharge needs identified.

## 2015-06-30 NOTE — Assessment & Plan Note (Addendum)
--  LLL mass, s/p bronch. Prelim results suggestive of atypical squamous cells, official cytology  pending.  --Currently on abx, agree with continuing this empirically.

## 2015-06-30 NOTE — Discharge Summary (Signed)
Physician Discharge Summary  Curtis Freeman SVX:[id number] DOB: 1941-11-21 DOA: 06/28/2015  PCP: Dr.Scout Guyett   Admit date: 06/28/2015 Discharge date: 06/30/2015  Time spent:40  minutes  Recommendations for Outpatient Follow-up:  1 Follow up with Dr. Ashby Dawes. 2 Follow up with Dr. Ginette Pitman  Discharge Diagnoses:  Active Problems: 1  Hemoptysis secondary to consolidation   And left  Lung mass 2   Emphysema 3  A-fib 4 Obstructive sleep apnea 5  Hx of Stroke 6 Protein-calorie malnutrition, severe   Discharge Condition: Fair  Diet recommendation:   Filed Weights   06/28/15 1641  Weight: 86.592 kg (190 lb 14.4 oz)    History of present illness:  Curtis Freeman is a 74 year old male with a known history of A. fib COPD pneumonia hypertension previous CVA presented to the ED with the hemoptysis. Patient was also noted in A. fib with a heart rate 132 that came down to the 80s with Cardizem and Lopressor. Chest x-ray showed  showed left lung infiltrate pneumonia versus Ca  Hospital Course:  Patient was admitted [facility] he was seen in consultation by the pulmonologist Dr. Caffie Damme . Ramachandran . He underwent a CT scan of the chest which showed evidence of lymphadenopathy subcarinal mass and consolidation the superior segment of the left lower lobe. His Xarelto  was DC'd he was switched to IV heparin Patient subsequently underwent bronchoscopy with EBU S biopsies taken of the subcarinal and right paratracheal nodes washings and endobronchial biopsies were taken of the left upper lobe brushings taken the left lower lobe. As per cytologist initial review  Biopsy showed evidence of atypical squamous cells. Full pathology report was pending. Patient felt symptomatically better with IV anabiotic's. He was discharged in stable condition and advised to restart Xarelto. Pt has been advised to follow me Dr. Ginette Pitman also follow up with pulmonologist Dr. Ashby Dawes in 1-2  weeks' time. Referral to oncology will be based on pathology report   Procedures: Bronchoscopy with EBUS biopsies, washings and brushings   Consultations: Dr. Erin Fulling- Cardiologist Dr. Ashby Dawes- Pulmonolgist  Discharge Exam: Filed Vitals:   06/30/15 0820  BP: 132/92  Pulse: 114  Temp: 98.3 F (36.8 C)  Resp: 19    General: Appears weak Cardiovascular: s1 S2 irregular Respiratory: Decreased air entry left base   Discharge Instructions   Discharge Instructions    Diet - low sodium heart healthy    Complete by:  As directed      Discharge instructions    Complete by:  As directed   D/c Home today. Follow up with Dr. Ashby Dawes- Pulmonary Follow up with Dr.Logen Heintzelman in 1 -2 weeks Call office to make appt          Current Discharge Medication List    START taking these medications   Details  albuterol (PROVENTIL) (2.5 MG/3ML) 0.083% nebulizer solution Take 3 mLs (2.5 mg total) by nebulization every 4 (four) hours as needed for wheezing or shortness of breath. Qty: 75 mL, Refills: 12    azithromycin (ZITHROMAX) 250 MG tablet 1 tab po qd x 3 days Qty: 3 each, Refills: 0    cefUROXime (CEFTIN) 250 MG tablet Take 1 tablet (250 mg total) by mouth 2 (two) times daily with a meal. Qty: 20 tablet, Refills: 0      CONTINUE these medications which have NOT CHANGED   Details  atorvastatin (LIPITOR) 20 MG tablet Take 20 mg by mouth daily.    Fluticasone-Salmeterol (ADVAIR DISKUS) 250-50 MCG/DOSE AEPB Inhale 1 puff  into the lungs 2 (two) times daily.    hydrochlorothiazide (HYDRODIURIL) 12.5 MG tablet Take 12.5 mg by mouth daily.    losartan (COZAAR) 100 MG tablet Take 100 mg by mouth daily.    rivaroxaban (XARELTO) 20 MG TABS tablet Take 20 mg by mouth daily with supper.    VENTOLIN HFA 108 (90 BASE) MCG/ACT inhaler Inhale 1 puff into the lungs every 4 (four) hours as needed for wheezing or shortness of breath.  Refills: 0    zolpidem (AMBIEN) 5 MG tablet Take 1  tablet by mouth at bedtime as needed for sleep.       STOP taking these medications     mometasone-formoterol (DULERA) 100-5 MCG/ACT AERO        No Known Allergies Follow-up Information    Follow up with Tracie Harrier, MD. Go on 07/06/2015.   Specialty:  Internal Medicine   Why:  Thursday at 2:00pm for hospital follow-up   Contact information:   [address] [place] [id number] [phone]        The results of significant diagnostics from this hospitalization (including imaging, microbiology, ancillary and laboratory) are listed below for reference.    Significant Diagnostic Studies: Dg Chest 1 View  06/30/2015   CLINICAL DATA:  Pneumonia, emphysema and pulmonary fibrosis, left-sided lung mass  EXAM: CHEST  1 VIEW  COMPARISON:  CT scan of the chest of June 28, 2015 and chest x-ray of the same day  FINDINGS: There is a persistent left perihilar masslike density. The lungs are well-expanded. The interstitial markings remain increased bilaterally. The heart is normal in size. The pulmonary vascularity is not engorged. The trachea is midline. The bony thorax exhibits no acute abnormality.  IMPRESSION: Stable appearance of the chest since yesterday's study with persistent left hilar masslike density with known parenchymal abnormality in the left upper and lower lobes. COPD and pulmonary fibrosis.   Electronically Signed   By: David  Martinique M.D.   On: 06/30/2015 07:38   Ct Chest W Contrast  06/28/2015   CLINICAL DATA:  Hemoptysis beginning yesterday, history atrial fibrillation on Xarelto, COPD, pneumonia, hypertension, stroke  EXAM: CT CHEST WITH CONTRAST  TECHNIQUE: Multidetector CT imaging of the chest was performed during intravenous contrast administration. Sagittal and coronal MPR images reconstructed from axial data set.  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  Scattered atherosclerotic calcifications aorta and coronary arteries.  Pulmonary arteries grossly patent on nondedicated exam.  Enlarged RIGHT hilar lymph node 15 mm short axis image 30, increased.  Enlarged RIGHT peritracheal node 12 mm short axis image 24 previously 14 mm.  Additional scattered normal sized mediastinal and hilar nodes.  Thyroid gland in visualized inferior cervical region unremarkable.  Visualized upper abdomen normal appearance.  Minimal pericardial fluid.  Small LEFT pleural effusion increased since previous exam.  Persisting consolidation involving posterior LEFT upper lobe and superior segment LEFT lower lobe, extending to hilum.  Associated narrowing of LEFT lower lobe bronchi and scattered peribronchial thickening.  Malignancy not excluded.  Underlying emphysematous changes with scattered peripheral subpleural fibrosis in both lungs.  Increased nodule at medial RIGHT upper lobe 8 mm diameter image 9 previously 5 mm.  New RIGHT lower lobe nodular density 8 mm diameter image 44.  Stable tiny nodular density RIGHT lower lobe 3 mm diameter image 45.  New small LEFT lung nodules images 23,  24, 26, and 33.  Slightly increased size of tiny LEFT upper lobe nodule image 27.  No pneumothorax or acute osseous findings.  Small lipoma RIGHT latissimus dorsi.  IMPRESSION: Increased opacification of the posterior mid LEFT lung involving posterior LEFT upper lobe and superior segment of the LEFT lower lobe extending to pulmonary hilum; the presence of progressive/non resolving pulmonary infiltrate is concerning for pulmonary neoplasm and bronchoscopy with tissue diagnosis is recommended.  Underlying emphysematous changes with increased peribronchial thickening in particularly LEFT lung since previous exam.  Multiple tiny pulmonary nodules, increasing or new since previous exam cannot exclude metastatic disease.  RIGHT paratracheal and increased RIGHT hilar adenopathy.   Electronically  Signed   By: Lavonia Dana M.D.   On: 06/28/2015 18:13   Dg Chest Portable 1 View  06/28/2015   CLINICAL DATA:  Acute onset of hemoptysis. Atrial fibrillation. Initial encounter.  EXAM: PORTABLE CHEST - 1 VIEW  COMPARISON:  Chest radiograph performed 04/21/2015, and CT of the chest performed 04/25/2015  FINDINGS: There is increasing left perihilar airspace opacification, which may reflect a chronic atypical infection, given hemoptysis and gradual worsening from the prior study. Mild patchy right basilar airspace opacity is noted. No definite pleural effusion or pneumothorax is seen.  The cardiomediastinal silhouette is borderline normal in size. No acute osseous abnormalities are identified.  IMPRESSION: Increasing left perihilar airspace opacification, which may reflect a chronic atypical infection or possibly an unusual malignancy, given hemoptysis and gradual worsening from the prior study. Mild patchy right basilar airspace opacity seen. Pulmonary consultation was recommended per the prior CT, for possible bronchoscopy and tissue sampling.   Electronically Signed   By: Garald Balding M.D.   On: 06/28/2015 06:50    Microbiology: Recent Results (from the past 240 hour(s))  Culture, blood (routine x 2) Call MD if unable to obtain prior to antibiotics being given     Status: None (Preliminary result)   Collection Time: 06/28/15 10:27 AM  Result Value Ref Range Status   Specimen Description BLOOD RIGHT ASSIST CONTROL  Final   Special Requests BOTTLES DRAWN AEROBIC AND ANAEROBIC 5 CC  Final   Culture NO GROWTH 2 DAYS  Final   Report Status PENDING  Incomplete  Culture, blood (routine x 2) Call MD if unable to obtain prior to antibiotics being given     Status: None (Preliminary result)   Collection Time: 06/28/15 10:35 AM  Result Value Ref Range Status   Specimen Description BLOOD LEFT ASSIST CONTROL  Final   Special Requests BOTTLES DRAWN AEROBIC AND ANAEROBIC  5 CC  Final   Culture NO GROWTH 2 DAYS   Final   Report Status PENDING  Incomplete  Culture, sputum-assessment     Status: None   Collection Time: 06/28/15  5:50 PM  Result Value Ref Range Status   Specimen Description EXPECTORATED SPUTUM  Final   Special Requests NONE  Final   Sputum evaluation   Final    Sputum specimen not acceptable for testing.  Please recollect.   Results Called to: [facility] AT 2125 BY TB    Report Status 06/29/2015 FINAL  Final  Body fluid culture     Status: None (Preliminary result)   Collection Time: 06/29/15  4:04 PM  Result Value Ref Range Status   Specimen Description BRONCHIAL WASHINGS  Final   Special Requests LUL  Final   Gram Stain   Final    FEW WBC SEEN RARE RED BLOOD CELLS NO ORGANISMS SEEN  Culture TOO YOUNG TO READ  Final   Report Status PENDING  Incomplete  Body fluid culture     Status: None (Preliminary result)   Collection Time: 06/29/15  4:04 PM  Result Value Ref Range Status   Specimen Description BRONCHIAL WASHINGS  Final   Special Requests LLL  Final   Gram Stain   Final    FEW WBC SEEN MODERATE RED BLOOD CELLS NO ORGANISMS SEEN    Culture NO GROWTH < 24 HOURS  Final   Report Status PENDING  Incomplete  Culture, fungus without smear     Status: None (Preliminary result)   Collection Time: 06/29/15  4:04 PM  Result Value Ref Range Status   Specimen Description BRONCHIAL WASHINGS  Final   Special Requests LLL  Final   Culture NO GROWTH < 24 HOURS  Final   Report Status PENDING  Incomplete  Culture, fungus without smear     Status: None (Preliminary result)   Collection Time: 06/29/15  4:04 PM  Result Value Ref Range Status   Specimen Description BRONCHIAL WASHINGS  Final   Special Requests LUL  Final   Culture NO GROWTH < 24 HOURS  Final   Report Status PENDING  Incomplete     Labs: Basic Metabolic Panel:  Recent Labs Lab 06/28/15 0544  NA 136  K 3.8  CL 99*  CO2 25  GLUCOSE 132*  BUN 17  CREATININE 1.17  CALCIUM 8.9   Liver Function  Tests:  Recent Labs Lab 06/28/15 0544  AST 28  ALT 31  ALKPHOS 102  BILITOT 1.0  PROT 7.1  ALBUMIN 3.3*   No results for input(s): LIPASE, AMYLASE in the last 168 hours. No results for input(s): AMMONIA in the last 168 hours. CBC:  Recent Labs Lab 06/28/15 0544 06/30/15 0542  WBC 9.0 7.2  HGB 14.5 14.0  HCT 44.0 41.7  MCV 83.0 82.7  PLT 261 245   Cardiac Enzymes:  Recent Labs Lab 06/28/15 0544  TROPONINI 0.06*   BNP: BNP (last 3 results) No results for input(s): BNP in the last 8760 hours.  ProBNP (last 3 results) No results for input(s): PROBNP in the last 8760 hours.  CBG: No results for input(s): GLUCAP in the last 168 hours.     SignedTracie Harrier   06/30/2015, 3:20 PM

## 2015-06-30 NOTE — Progress Notes (Signed)
A&O. VSS. Tolerating diet well. No complaints of pain or nausea. Resting comfortably. Ambulating unassisted with steady gait. Saturations i upper 90's on room air at rest and on exertion. Discharged per MD orders. Discharge instructions reviewed with pt and pt verbalized understanding. Prescriptions given to pt. IV removed per policy. Discharged via wheelchair escorted by nursing staff and wife with all belongings.

## 2015-06-30 NOTE — Assessment & Plan Note (Signed)
--  Fibrosis seen on CT chest, appears mild.

## 2015-07-01 LAB — LEGIONELLA PNEUMOPHILA SEROGP 1 UR AG: L. pneumophila Serogp 1 Ur Ag: NEGATIVE

## 2015-07-01 LAB — STREPTOCOCCUS PNEUMONIAE AG (CSF): STREPTOCOCCUS PNEUMONIAE AG: NEGATIVE

## 2015-07-03 ENCOUNTER — Telehealth: Payer: Self-pay | Admitting: Internal Medicine

## 2015-07-03 ENCOUNTER — Ambulatory Visit: Admission: RE | Admit: 2015-07-03 | Payer: Medicare Other | Source: Ambulatory Visit

## 2015-07-03 ENCOUNTER — Telehealth: Payer: Self-pay | Admitting: *Deleted

## 2015-07-03 LAB — CULTURE, BLOOD (ROUTINE X 2)
CULTURE: NO GROWTH
Culture: NO GROWTH

## 2015-07-03 NOTE — Telephone Encounter (Signed)
I informed Curtis Freeman and his wife of his diagnosis of lung cancer. I let them know that the cancer center will be contacting them today or tomorrow for an appointment to discuss next steps. I also want them to keep their follow up appointment with me to check on his respiratory status.

## 2015-07-03 NOTE — Telephone Encounter (Signed)
  Oncology Nurse Navigator Documentation    Navigator Encounter Type: Telephone;Introductory phone call (07/03/15 1600)               Notified patient of appt tomorrow with Dr. Oliva Bustard, reviewed navigation program. Will follow.

## 2015-07-04 ENCOUNTER — Inpatient Hospital Stay: Payer: Medicare Other | Attending: Oncology | Admitting: Oncology

## 2015-07-04 ENCOUNTER — Ambulatory Visit
Admission: RE | Admit: 2015-07-04 | Discharge: 2015-07-04 | Disposition: A | Discharge status: Home / Self Care | Payer: Medicare Other | Source: Ambulatory Visit | Attending: Radiation Oncology | Admitting: Radiation Oncology

## 2015-07-04 ENCOUNTER — Encounter: Payer: Self-pay | Admitting: Radiation Oncology

## 2015-07-04 ENCOUNTER — Encounter: Payer: Self-pay | Admitting: *Deleted

## 2015-07-04 ENCOUNTER — Telehealth: Payer: Self-pay | Admitting: *Deleted

## 2015-07-04 VITALS — BP 144/90 | HR 72 | Temp 97.6°F | Wt 189.6 lb

## 2015-07-04 DIAGNOSIS — Z7901 Long term (current) use of anticoagulants: Secondary | ICD-10-CM | POA: Insufficient documentation

## 2015-07-04 DIAGNOSIS — C3432 Malignant neoplasm of lower lobe, left bronchus or lung: Secondary | ICD-10-CM | POA: Insufficient documentation

## 2015-07-04 DIAGNOSIS — R531 Weakness: Secondary | ICD-10-CM | POA: Diagnosis not present

## 2015-07-04 DIAGNOSIS — R5383 Other fatigue: Secondary | ICD-10-CM | POA: Diagnosis not present

## 2015-07-04 DIAGNOSIS — J449 Chronic obstructive pulmonary disease, unspecified: Secondary | ICD-10-CM | POA: Diagnosis not present

## 2015-07-04 DIAGNOSIS — Z8673 Personal history of transient ischemic attack (TIA), and cerebral infarction without residual deficits: Secondary | ICD-10-CM

## 2015-07-04 DIAGNOSIS — F419 Anxiety disorder, unspecified: Secondary | ICD-10-CM

## 2015-07-04 DIAGNOSIS — M25552 Pain in left hip: Secondary | ICD-10-CM

## 2015-07-04 DIAGNOSIS — Z87891 Personal history of nicotine dependence: Secondary | ICD-10-CM | POA: Diagnosis not present

## 2015-07-04 DIAGNOSIS — Z510 Encounter for antineoplastic radiation therapy: Secondary | ICD-10-CM | POA: Insufficient documentation

## 2015-07-04 DIAGNOSIS — I10 Essential (primary) hypertension: Secondary | ICD-10-CM | POA: Diagnosis not present

## 2015-07-04 DIAGNOSIS — Z79899 Other long term (current) drug therapy: Secondary | ICD-10-CM

## 2015-07-04 DIAGNOSIS — C3402 Malignant neoplasm of left main bronchus: Secondary | ICD-10-CM

## 2015-07-04 DIAGNOSIS — E780 Pure hypercholesterolemia: Secondary | ICD-10-CM | POA: Diagnosis not present

## 2015-07-04 DIAGNOSIS — C3492 Malignant neoplasm of unspecified part of left bronchus or lung: Secondary | ICD-10-CM

## 2015-07-04 DIAGNOSIS — I4891 Unspecified atrial fibrillation: Secondary | ICD-10-CM | POA: Diagnosis not present

## 2015-07-04 DIAGNOSIS — C3490 Malignant neoplasm of unspecified part of unspecified bronchus or lung: Secondary | ICD-10-CM

## 2015-07-04 NOTE — Progress Notes (Signed)
  Oncology Nurse Navigator Documentation    Navigator Encounter Type: Initial MedOnc;Initial RadOnc;Education (07/04/15 1400)               Met with patient and family at initial oncology consultations. Introduced Programmer, multimedia, and reviewed plan of care. Will follow

## 2015-07-04 NOTE — Telephone Encounter (Signed)
  Oncology Nurse Navigator Documentation    Navigator Encounter Type: Telephone (07/04/15 0900)

## 2015-07-04 NOTE — Consult Note (Signed)
Except an outstanding is perfect of Radiation Oncology NEW PATIENT EVALUATION  Name: Curtis Freeman  MRN: [id number]  Date:   07/04/2015     DOB: 01-02-1941   This 74 y.o. male patient presents to the clinic for initial evaluation of non-small cell lung cancer of the left lung.  REFERRING PHYSICIAN: Lavera Guise, MD  CHIEF COMPLAINT: No chief complaint on file.   DIAGNOSIS: There were no encounter diagnoses.   PREVIOUS INVESTIGATIONS:  CT scans are reviewed PET CT scan has been ordered Pathology reports reviewed Clinical notes reviewed  HPI: patient is a pleasant 74 year old male with history history of A. Fib and previous CVA who was recently limited to the hospital through the emergency room for hemoptysis. At that time he was noted to have a heart rate of 132 consistent with his A. Fib. Chest x-ray showed a left lung infiltrate. CT scan confirmed left lower lobe mass with evidence of subcarinal adenopathy and consolidation of the superior segment of the left lower lobe. He underwent bronchoscopy and EBUS . Cytology was positive for malignant cells consistent with non-small cell lung cancer.patient does get short of breath quite easily has a history of emphysema as well as the A. Fib. I've asked to evaluate patient for possible radiation therapy. He has a PET CT scan scheduled for later this week as well as an appointment with surgical oncology.  PLANNED TREATMENT REGIMEN: probable chemoradiation with curative intent  PAST MEDICAL HISTORY:  has a past medical history of Hypertension; Pneumonia; Cancer; Stroke; and COPD (chronic obstructive pulmonary disease).    PAST SURGICAL HISTORY: History reviewed. No pertinent past surgical history.  FAMILY HISTORY: family history is not on file.  SOCIAL HISTORY:  reports that he quit smoking about 10 years ago. He does not have any smokeless tobacco history on file.  ALLERGIES: Zolpidem tartrate  MEDICATIONS:  Current Outpatient  Prescriptions  Medication Sig Dispense Refill  . albuterol (PROVENTIL) (2.5 MG/3ML) 0.083% nebulizer solution Take 3 mLs (2.5 mg total) by nebulization every 4 (four) hours as needed for wheezing or shortness of breath. 75 mL 12  . atorvastatin (LIPITOR) 20 MG tablet Take 20 mg by mouth daily.    Marland Kitchen azithromycin (ZITHROMAX) 250 MG tablet 1 tab po qd x 3 days (Patient not taking: Reported on 07/04/2015) 3 each 0  . cefUROXime (CEFTIN) 250 MG tablet Take 1 tablet (250 mg total) by mouth 2 (two) times daily with a meal. 20 tablet 0  . Fluticasone-Salmeterol (ADVAIR DISKUS) 250-50 MCG/DOSE AEPB Inhale 1 puff into the lungs 2 (two) times daily.    . hydrochlorothiazide (HYDRODIURIL) 12.5 MG tablet Take 12.5 mg by mouth daily.    Marland Kitchen losartan (COZAAR) 100 MG tablet Take 100 mg by mouth daily.    . rivaroxaban (XARELTO) 20 MG TABS tablet Take 20 mg by mouth daily with supper.    . VENTOLIN HFA 108 (90 BASE) MCG/ACT inhaler Inhale 1 puff into the lungs every 4 (four) hours as needed for wheezing or shortness of breath.   0  . zolpidem (AMBIEN) 5 MG tablet Take 1 tablet by mouth at bedtime as needed for sleep.      No current facility-administered medications for this encounter.    ECOG PERFORMANCE STATUS:  1 - Symptomatic but completely ambulatory  REVIEW OF SYSTEMS: except for left-sided weakness caused by his CVA history of A. Fib and recent hips history of hemoptysis  Patient denies any weight loss, fatigue, weakness, fever, chills or night  sweats. Patient denies any loss of vision, blurred vision. Patient denies any ringing  of the ears or hearing loss. No irregular heartbeat. Patient denies heart murmur or history of fainting. Patient denies any chest pain or pain radiating to her upper extremities. Patient denies any shortness of breath, difficulty breathing at night, cough or hemoptysis. Patient denies any swelling in the lower legs. Patient denies any nausea vomiting, vomiting of blood, or coffee  ground material in the vomitus. Patient denies any stomach pain. Patient states has had normal bowel movements no significant constipation or diarrhea. Patient denies any dysuria, hematuria or significant nocturia. Patient denies any problems walking, swelling in the joints or loss of balance. Patient denies any skin changes, loss of hair or loss of weight. Patient denies any excessive worrying or anxiety or significant depression. Patient denies any problems with insomnia. Patient denies excessive thirst, polyuria, polydipsia. Patient denies any swollen glands, patient denies easy bruising or easy bleeding. Patient denies any recent infections, allergies or URI. Patient "s visual fields have not changed significantly in recent time.    PHYSICAL EXAM: There were no vitals taken for this visit. Well-developed male who uses a cane for ambulatory assistance. No cervical or supraclavicular adenopathy is identified. Well-developed well-nourished patient in NAD. HEENT reveals PERLA, EOMI, discs not visualized.  Oral cavity is clear. No oral mucosal lesions are identified. Neck is clear without evidence of cervical or supraclavicular adenopathy. Lungs are clear to A&P. Cardiac examination is essentially unremarkable with regular rate and rhythm without murmur rub or thrill. Abdomen is benign with no organomegaly or masses noted. Motor sensory and DTR levels are equal and symmetric in the upper and lower extremities. Cranial nerves II through XII are grossly intact. Proprioception is intact. No peripheral adenopathy or edema is identified. No motor or sensory levels are noted. Crude visual fields are within normal range.   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 IIIB based on subcarinal adenopathy most likely hypermetabolic on PET CT scan consistent  with contralateral mediastinal lymph node involvement of his known non-small cell lung cancer in 74 year old male  PLAN: at this time I await the PET/CT results and evaluation by Dr. Faith Rogue. I discussed the case personally with Dr. Oliva Bustard. Would favor combined modality treatment with curative intent. Will possibly treat a split course fashion to see resolution of consolidation of his left lower lobe with hopeful lung expansion after the first 4 weeks of radiation. I've tentatively set up and ordered CT simulation early next week. Should the patient ideveloped significant hemoptysis would treat an emergent basis with palliative radiation therapy. All of this was discussed with medical oncology. Patient and his family seem to comprehend my treatment plan well.  I would like to take this opportunity for allowing me to participate in the care of your patient.Armstead Peaks., MD

## 2015-07-04 NOTE — Progress Notes (Signed)
Patient does have living will.  Former smoker.

## 2015-07-05 ENCOUNTER — Encounter: Payer: Self-pay | Admitting: Oncology

## 2015-07-05 DIAGNOSIS — C3492 Malignant neoplasm of unspecified part of left bronchus or lung: Secondary | ICD-10-CM | POA: Insufficient documentation

## 2015-07-05 HISTORY — DX: Malignant neoplasm of unspecified part of left bronchus or lung: C34.92

## 2015-07-05 NOTE — Progress Notes (Signed)
[place] @ [facility] Telephone:(336) [phone]  Fax:(336) [place]: 01-28-1941  MR#: [id number]  EQA#:[id number]  Patient Care Team: Lavera Guise, MD as PCP - General (Internal Medicine)  CHIEF COMPLAINT:  Chief Complaint  Patient presents with  . New Evaluation    VISIT DIAGNOSIS:     ICD-9-CM ICD-10-CM   1. Malignant neoplasm of lung, unspecified laterality, unspecified part of lung 162.9 C34.90 NM PET Image Initial (PI) Skull Base To Thigh  2. Carcinoma of left lung 162.9 C34.92      Oncology History   1.  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  Patient has atrial fibrillation on a long-term xeralto 2.  History of cerebrovascular accident when he was taken off of xeralto 3.  Hypercholesterolemia 4.  Hypertension INTERVAL HISTORY:  74 year old gentleman started having cough and hemoptysis a week ago was admitted in the hospital.  Patient had a CT scan which revealed left lower lobe mass.  Patient underwent bronchoscopy.  Patient was started again on 0 all toe bronchoscopy revealed left lower lobe (and lingula) mass positive for malignancy.  Non-small cell type.  Some of the biopsy of subcarinal lymph nodes were negative for malignancy.  0 all toe patient does not have any more hemoptysis at present time.  No chest pain.  Patient had a pain in the left hip  2 weeks duration Patient was referred to me for further evaluation and management regarding carcinoma of lung  REVIEW OF SYSTEMS:   Gen. status: Extremely apprehensive individual.  Not in any acute distress.    general status: Patient is feeling weak and tired.  No change in a performance status.  No chills.  No fever. HEENT:   No evidence of stomatitis Lungs as described above in  history of present illness history of cough, hemoptysis patient is former smoker has COPD using inhalers Cardiac: No chest pain or paroxysmal nocturnal dyspnea.  History of atrial fibrillation GI: No nausea no vomiting no diarrhea no abdominal pain Skin: No rash Lower extremity no swelling Neurological system: No tingling.  No numbness.  No other focal signs.  History of cerebrovascular accident in April with residual weakness Musculoskeletal system no bony pains GU: Patient had a high PSA but biopsy was negative  As per HPI. Otherwise, a complete review of systems is negatve.  PAST MEDICAL HISTORY: Past Medical History  Diagnosis Date  . Hypertension   . Pneumonia   . Cancer     lung mass  . Stroke   . COPD (chronic obstructive pulmonary disease)   . Carcinoma of left lung 07/05/2015    PAST SURGICAL HISTORY:  had a cataract surgery in the past.  Has a large prostate biopsy was done.   FAMILY HISTORY There is no significant family history of breast cancer, ovarian cancer, colon cancer  grandmother had stomach cancer family history of heart disease     ADVANCED DIRECTIVES:  Patient does have advance healthcare directive, Patient   does not desire to make any changes  HEALTH MAINTENANCE: History  Substance Use Topics  . Smoking status: Former Smoker    Quit date: 06/27/2005  . Smokeless tobacco: Not on file  . Alcohol Use: Not on file  Allergies  Allergen Reactions  . Zolpidem Tartrate Anxiety    Current Outpatient Prescriptions  Medication Sig Dispense Refill  . albuterol (PROVENTIL) (2.5 MG/3ML) 0.083% nebulizer solution Take 3 mLs (2.5 mg total) by nebulization every 4 (four) hours as needed for wheezing or shortness of breath. 75 mL 12  . atorvastatin (LIPITOR) 20 MG tablet Take 20 mg by mouth daily.    . cefUROXime (CEFTIN) 250 MG tablet Take 1 tablet (250 mg total) by mouth 2 (two) times daily with a meal. 20 tablet 0  . Fluticasone-Salmeterol (ADVAIR  DISKUS) 250-50 MCG/DOSE AEPB Inhale 1 puff into the lungs 2 (two) times daily.    . hydrochlorothiazide (HYDRODIURIL) 12.5 MG tablet Take 12.5 mg by mouth daily.    Marland Kitchen losartan (COZAAR) 100 MG tablet Take 100 mg by mouth daily.    . rivaroxaban (XARELTO) 20 MG TABS tablet Take 20 mg by mouth daily with supper.    Marland Kitchen azithromycin (ZITHROMAX) 250 MG tablet 1 tab po qd x 3 days (Patient not taking: Reported on 07/04/2015) 3 each 0  . VENTOLIN HFA 108 (90 BASE) MCG/ACT inhaler Inhale 1 puff into the lungs every 4 (four) hours as needed for wheezing or shortness of breath.   0  . zolpidem (AMBIEN) 5 MG tablet Take 1 tablet by mouth at bedtime as needed for sleep.      No current facility-administered medications for this visit.    OBJECTIVE: PHYSICAL EXAM:GENERAL:  Well developed, well nourished, sitting comfortably in the exam room in no acute distress.  Patient is walking with the help of cane  MENTAL STATUS:  Alert and oriented to person, place and time. HEAD.  Normocephalic, atraumatic, face symmetric, no Cushingoid features. EYES:    Pupils equal round and reactive to light and accomodation.  No conjunctivitis or scleral icterus. ENT:  Oropharynx clear without lesion.  Tongue normal. Mucous membranes moist.  RESPIRATORY:  Diminished and entry on both sides.  Emphysematous chest.  His dullness on percussion on the left lower chest CARDIOVASCULAR:  Regular rate and rhythm without murmur, rub or gallop. Abdominal exam revealed normal bowel sounds. The abdomen was soft, non-tender, and without masses, organomegaly, or appreciable enlargement of the abdominal aorta. BACK:  No CVA tenderness.  No tenderness on percussion of the back or rib cage.  Intermittent tenderness in the left hip SKIN:  No rashes, ulcers or lesions. EXTREMITIES: No edema, no skin discoloration or tenderness.  No palpable cords. LYMPH NODES: No palpable cervical, supraclavicular, axillary or inguinal adenopathy  NEUROLOGICAL: She  has residual weakness on the right side walking with the help of cane PSYCH:  Appropriate.very apprehensive.    Filed Vitals:   07/04/15 1347  BP: 144/90  Pulse: 72  Temp: 97.6 F (36.4 C)     Body mass index is 27.2 kg/(m^2).    ECOG FS:1 - Symptomatic but completely ambulatory  LAB RESULTS:  No visits with results within 2 Day(s) from this visit. Latest known visit with results is:  Admission on 06/28/2015, Discharged on 06/30/2015  Component Date Value Ref Range Status  . WBC 06/28/2015 9.0  3.8 - 10.6 K/uL Final  . RBC 06/28/2015 5.30  4.40 - 5.90 MIL/uL Final  . Hemoglobin 06/28/2015 14.5  13.0 - 18.0 g/dL Final  . HCT 06/28/2015 44.0  40.0 - 52.0 % Final  . MCV 06/28/2015 83.0  80.0 - 100.0 fL Final  . MCH 06/28/2015 27.4  26.0 - 34.0 pg Final  . MCHC 06/28/2015 33.0  32.0 - 36.0 g/dL Final  . RDW 06/28/2015 14.7* 11.5 - 14.5 % Final  . Platelets 06/28/2015 261  150 - 440 K/uL Final  . Sodium 06/28/2015 136  135 - 145 mmol/L Final  . Potassium 06/28/2015 3.8  3.5 - 5.1 mmol/L Final  . Chloride 06/28/2015 99* 101 - 111 mmol/L Final  . CO2 06/28/2015 25  22 - 32 mmol/L Final  . Glucose, Bld 06/28/2015 132* 65 - 99 mg/dL Final  . BUN 06/28/2015 17  6 - 20 mg/dL Final  . Creatinine, Ser 06/28/2015 1.17  0.61 - 1.24 mg/dL Final  . Calcium 06/28/2015 8.9  8.9 - 10.3 mg/dL Final  . Total Protein 06/28/2015 7.1  6.5 - 8.1 g/dL Final  . Albumin 06/28/2015 3.3* 3.5 - 5.0 g/dL Final  . AST 06/28/2015 28  15 - 41 U/L Final  . ALT 06/28/2015 31  17 - 63 U/L Final  . Alkaline Phosphatase 06/28/2015 102  38 - 126 U/L Final  . Total Bilirubin 06/28/2015 1.0  0.3 - 1.2 mg/dL Final  . GFR calc non Af Amer 06/28/2015 59* >60 mL/min Final  . GFR calc Af Amer 06/28/2015 >60  >60 mL/min Final   Comment: (NOTE) The eGFR has been calculated using the CKD EPI equation. This calculation has not been validated in all clinical situations. eGFR's persistently <60 mL/min signify possible  Chronic Kidney Disease.   . Anion gap 06/28/2015 12  5 - 15 Final  . Troponin I 06/28/2015 0.06* <0.031 ng/mL Final   Comment: READ BACK AND VERIFIED WITH JULIE REITZ AT 7858 ON 06/28/15.Marland KitchenMarland KitchenToeterville        PERSISTENTLY INCREASED TROPONIN VALUES IN THE RANGE OF 0.04-0.49 ng/mL CAN BE SEEN IN:       -UNSTABLE ANGINA       -CONGESTIVE HEART FAILURE       -MYOCARDITIS       -CHEST TRAUMA       -ARRYHTHMIAS       -LATE PRESENTING MYOCARDIAL INFARCTION       -COPD   CLINICAL FOLLOW-UP RECOMMENDED.   Marland Kitchen Specimen Description 06/28/2015 BLOOD RIGHT ASSIST CONTROL   Final  . Special Requests 06/28/2015 BOTTLES DRAWN AEROBIC AND ANAEROBIC 5 CC   Final  . Culture 06/28/2015 NO GROWTH 5 DAYS   Final  . Report Status 06/28/2015 07/03/2015 FINAL   Final  . Specimen Description 06/28/2015 BLOOD LEFT ASSIST CONTROL   Final  . Special Requests 06/28/2015 BOTTLES DRAWN AEROBIC AND ANAEROBIC  5 CC   Final  . Culture 06/28/2015 NO GROWTH 5 DAYS   Final  . Report Status 06/28/2015 07/03/2015 FINAL   Final  . Specimen Description 06/28/2015 EXPECTORATED SPUTUM   Final  . Special Requests 06/28/2015 NONE   Final  . Sputum evaluation 06/28/2015    Final                   Value:Sputum specimen not acceptable for testing.  Please recollect.   Results Called to: [facility] AT 2125 BY TB   . Report Status 06/28/2015 06/29/2015 FINAL   Final  . Prothrombin Time 06/28/2015 16.1* 11.4 - 15.0 seconds Final  . INR 06/28/2015 1.27   Final  . aPTT 06/28/2015 39* 24 - 36 seconds Final   Comment:        IF BASELINE aPTT IS ELEVATED, SUGGEST PATIENT RISK ASSESSMENT BE USED TO DETERMINE APPROPRIATE ANTICOAGULANT THERAPY.   . Heparin Unfractionated 06/28/2015 1.44* 0.30 - 0.70 IU/mL Final  Comment:        IF HEPARIN RESULTS ARE BELOW EXPECTED VALUES, AND PATIENT DOSAGE HAS BEEN CONFIRMED, SUGGEST FOLLOW UP TESTING OF ANTITHROMBIN III LEVELS.   Marland Kitchen Heparin Unfractionated 06/29/2015 1.70* 0.30 - 0.70 IU/mL Final     Comment:        IF HEPARIN RESULTS ARE BELOW EXPECTED VALUES, AND PATIENT DOSAGE HAS BEEN CONFIRMED, SUGGEST FOLLOW UP TESTING OF ANTITHROMBIN III LEVELS. RESULT REPEATED AND VERIFIED   . aPTT 06/29/2015 46* 24 - 36 seconds Final   Comment:        IF BASELINE aPTT IS ELEVATED, SUGGEST PATIENT RISK ASSESSMENT BE USED TO DETERMINE APPROPRIATE ANTICOAGULANT THERAPY.   . L. pneumophila Serogp 1 Ur Ag 06/28/2015 Negative  Negative Final   Comment: (NOTE) Performed At: [facility] [place], [place] [id number] Lindon Romp MD ZW:[id number]   . Source of Sample 06/28/2015 URINE, RANDOM   Final  . Specimen Source 06/28/2015 Urine   Final  . Streptococcus Pneumoniae Ag 06/28/2015 Negative  Negative Final  . Body Fld Culture, Sterile 06/28/2015 Not Indicated   Final  . Org ID 06/28/2015 Not indicated.   Final  . Please Note: 06/28/2015 Comment   Final   Comment: (NOTE) College of American Pathologists guidelines require a culture to be performed on CSF specimens negative by bacterial antigen testing (CAP MIC.[id number]). Performed At: [facility] [place], [place] [id number] Lindon Romp MD [id number]   . Source of Sample 06/28/2015 URINE, RANDOM   Final  . CYTOLOGY - NON GYN 06/29/2015    Final                   Value:Cytology - Non PAP CASE: [id number] PATIENT: Baylen Hebb Non-Gyn Cytology Report     SPECIMEN SUBMITTED: A. Lymph node, subcarinal,7R  CLINICAL HISTORY: None provided  PRE-OPERATIVE DIAGNOSIS: None Provided  POST-OPERATIVE DIAGNOSIS: None Provided     DIAGNOSIS: A. LYMPH NODE, SUBCARINAL, 7R; EBUS: - ADEQUATE SPECIMEN. - NEGATIVE FOR MALIGNANT CELLS.  Note: A cell block was included in the interpretation of this case.   GROSS DESCRIPTION: A. Site: Station 7R subcarinal lymph node Procedure: EBUS, FNA needle biopsy, rinse Cytotechnologist: Rivka Barbara, Micheline Rough Specimen(s)  collected: 2 Diff Quik stained slides 2 Pap stained slides      Volume: 30 mL      Description: Pink tinged fluid with 2 cylindrical shaped pieces of pale tan soft material possibly soft tissue      Submitted for:           ThinPrep           Cell block(s): 1  Final Diagnosis performed by Delorse Lek, MD.  Electronically signed 06/30/2015 6:00:46PM    The e                         lectronic signature indicates that the named Attending Pathologist has evaluated the specimen  Technical component performed at [facility], [address], [place], [place] [address] Lab: [phone] Dir: Darrick Penna. Evette Doffing, MD  Professional component performed at [facility], [facility], [place], [place],  [address] Lab: [phone] Dir: Dellia Nims. Reuel Derby, MD    . CYTOLOGY - NON GYN 06/29/2015    Final                   Value:Cytology - Non PAP CASE: [id number] PATIENT: Janet Teems Non-Gyn Cytology Report  SPECIMEN SUBMITTED: A. Lung, left upper lobe, lingula  CLINICAL HISTORY: None provided  PRE-OPERATIVE DIAGNOSIS: None Provided  POST-OPERATIVE DIAGNOSIS: None Provided     DIAGNOSIS: A. LUNG, LEFT UPPER LOBE, LINGULA; BRONCHIAL LAVAGE: - POSITIVE FOR MALIGNANT CELLS CONSISTENT WITH NON-SMALL CELL CARCINOMA.   GROSS DESCRIPTION: A. Site: Left lingula Procedure: Lavage Cytotechnologist: Rivka Barbara and Micheline Rough Specimen(s) collected:      Volume: 10 mL      Description: Pink red fluid      Submitted for:           ThinPrep   Final Diagnosis performed by Delorse Lek, MD.  Electronically signed 06/30/2015 6:07:25PM    The electronic signature indicates that the named Attending Pathologist has evaluated the specimen  Technical component performed at [facility], [address], [place], [place] [address] Lab: [phone] Dir: Darrick Penna. Evette Doffing, MD  Prof                         essional component performed at [facility], [facility], [place], [place], [place] [address] Lab: [phone] Dir: Dellia Nims. Rubinas, MD    . CYTOLOGY - NON GYN 06/29/2015    Final                   Value:Cytology - Non PAP CASE: [id number] PATIENT: Ryshawn Salts Non-Gyn Cytology Report     SPECIMEN SUBMITTED: A. Lymph node, paratracheal, 4R  CLINICAL HISTORY: None provided  PRE-OPERATIVE DIAGNOSIS: None Provided  POST-OPERATIVE DIAGNOSIS: None Provided     DIAGNOSIS: A. LYMPH NODE, PARATRACHEAL, 4R; EBUS: - LYMPHOCYTES, BRONCHIAL EPITHELIUM, AND ANTHRACOTIC PIGMENT.  Note: The specimen is adequate and definite malignant cells are not identified. A cell block was included in the interpretation of this case.   GROSS DESCRIPTION: A. Site: Station 4R lymph node paratracheal Procedure: EBUS, FNA needle biopsy, rinse Cytotechnologist: Rivka Barbara and Micheline Rough Specimen(s) collected: 2 Diff Quik stained slides 2 Pap stained slides      Volume: 40 mL      Description: Pink tinged fluid      Submitted for:           ThinPrep           Cell block(s): 1  Final Diagnosis performed by Delorse Lek, MD.  Electronically signed 06/30/2015 5:56:26PM                             The electronic signature indicates that the named Attending Pathologist has evaluated the specimen  Technical component performed at [facility], [address], [place], [place] [address] Lab: [phone] Dir: Darrick Penna. Evette Doffing, MD  Professional component performed at [facility], [facility], [place], [place],  [address] Lab: [phone] Dir: Dellia Nims. Reuel Derby, MD    . CYTOLOGY - NON GYN 06/29/2015    Final                   Value:Cytology - Non PAP CASE: [id number] PATIENT: Segundo Tenorio Non-Gyn Cytology Report     SPECIMEN SUBMITTED: A. Lung, left lower lobe  CLINICAL HISTORY: None provided  PRE-OPERATIVE DIAGNOSIS: None Provided  POST-OPERATIVE  DIAGNOSIS: None Provided     DIAGNOSIS: A. LUNG, LEFT LOWER LOBE; BRONCHIAL LAVAGE: - INCONCLUSIVE. - FEW ATYPICAL CELLS PRESENT.   GROSS DESCRIPTION: A. Site: Left lower lobe Procedure: Lavage Cytotechnologist: Rivka Barbara, Micheline Rough Specimen(s) collected:  Volume:  (for lavage only) 10 mL      Description: Clear fluid      Submitted for:           ThinPrep   Final Diagnosis performed by Delorse Lek, MD.  Electronically signed 06/30/2015 6:09:45PM    The electronic signature indicates that the named Attending Pathologist has evaluated the specimen  Technical component performed at [facility], [address] [place], [place] [address] Lab: [phone] Dir: Darrick Penna. Evette Doffing, MD  Professional component performed                          at [facility], [facility], [place], [place], [place] [address] Lab: [phone] Dir: Dellia Nims. Reuel Derby, MD    . CYTOLOGY - NON GYN 06/29/2015    Final                   Value:Cytology - Non PAP CASE: [id number] PATIENT: Alister Tew Non-Gyn Cytology Report     SPECIMEN SUBMITTED: A. Lung, left lower lobe, brushing  CLINICAL HISTORY: None provided  PRE-OPERATIVE DIAGNOSIS:   POST-OPERATIVE DIAGNOSIS: None Provided     DIAGNOSIS: A. LUNG, LEFT LOWER LOBE; BRONCHIAL BRUSHING: - POSITIVE FOR MALIGNANT CELLS, CONSISTENT WITH NON-SMALL CELL CARCINOMA.    GROSS DESCRIPTION: A. Site: Left lower lobe brushing Procedure: Bronchoscopy Cytotechnologist: Rivka Barbara and Micheline Rough Specimen(s) collected: 2 Diff Quik stained slides 2 Pap stained slides      Volume: 30 mL      Description: Clear fluid with 1 collection brush with a small amount of pink red soft material      Submitted for:           ThinPrep   Final Diagnosis performed by Delorse Lek, MD.  Electronically signed 06/30/2015 6:03:10PM    The electronic signature indicates that the named Attending  Pathologist has evaluated the specimen  Technical comp                         onent performed at [facility], [address] [place], [place] [address] Lab: [phone] Dir: Darrick Penna. Evette Doffing, MD  Professional component performed at [facility], [facility], [place], [place], [place] [address] Lab: [phone] Dir: Dellia Nims. Reuel Derby, MD    . SURGICAL PATHOLOGY 06/29/2015    Final                   Value:Surgical Pathology CASE: [id number] PATIENT: Ausencio Kerrigan Surgical Pathology Report     SPECIMEN SUBMITTED: A. Lung, left upper lobe, biopsy  CLINICAL HISTORY: None provided  PRE-OPERATIVE DIAGNOSIS: None Provided  POST-OPERATIVE DIAGNOSIS: None provided     DIAGNOSIS: A. LUNG, LEFT UPPER LOBE; ENDOBRONCHIAL FORCEPS BIOPSY: - NON-SMALL CELL CARCINOMA, FAVOR ADENOCARCINOMA.  Comment; Bronchial samples show carcinoma infiltrating the submucosa. The neoplastic cells are large, with eosinophilic cytoplasm. A few poorly formed lumens are observed but immunohistochemistry was performed to better assess the tumor type. The neoplastic cells show patchy expression of napsin A, and there is a focus of TTF-1 positive cells. The tumor cells are negative for p40. The results favor adenocarcinoma. All controls stained appropriately.  There are probably enough tumor cells remaining in the block for ancillary molecular testing.  The diagnosis was called to Fuller Mandril, [facility], on 07/04/15.  GROSS DESCRIPTION: A. Labeled: Lung left upper lobe Tissue Fragment(s): 4 Measurement: 0.1-0.2 cm Comment: Pink red tissue fragments Entirely submitted in cassette(s): 1  Final Diagnosis performed by Bryan Lemma, MD.  Electronically signed 07/04/2015 1:22:03PM    The electronic signature indicates that the named Attending Pathologist has evaluated the specimen  Technical component performed at [facility], [address] [place], [place] [address] Lab: [phone] Dir: Darrick Penna. Evette Doffing, MD  Professional component performed at [facility], [facility], [place], [place],  [address] Lab: [phone] Dir: Dellia Nims. Reuel Derby, MD    . Specimen Description 06/29/2015 BRONCHIAL WASHINGS   Final  . Special Requests 06/29/2015 LUL   Final  . Gram Stain 06/29/2015    Final                   Value:FEW WBC SEEN RARE RED BLOOD CELLS NO ORGANISMS SEEN   . Culture 06/29/2015 HOLDING FOR POSSIBLE ANAEROBE   Final  . Report Status 06/29/2015 07/03/2015 FINAL   Final  . Specimen Description 06/29/2015 BRONCHIAL WASHINGS   Final  . Special Requests 06/29/2015 LLL   Final  . Gram Stain 06/29/2015    Final                   Value:FEW WBC SEEN MODERATE RED BLOOD CELLS NO ORGANISMS SEEN   . Culture 06/29/2015 HOLDING FOR POSSIBLE ANAEROBE   Final  . Report Status 06/29/2015 PENDING   Incomplete  . Specimen Description 06/29/2015 BRONCHIAL WASHINGS   Final  . Special Requests 06/29/2015 LLL   Final  . Culture 06/29/2015 NO GROWTH 5 DAYS   Final  . Report Status 06/29/2015 PENDING   Incomplete  . Specimen Description 06/29/2015 BRONCHIAL WASHINGS   Final  . Special Requests 06/29/2015 LUL   Final  . Culture 06/29/2015 CANDIDA ALBICANS   Final  . Report Status 06/29/2015 PENDING   Incomplete  . Heparin Unfractionated 06/30/2015 0.55  0.30 - 0.70 IU/mL Final   Comment:        IF HEPARIN RESULTS ARE BELOW EXPECTED VALUES, AND PATIENT DOSAGE HAS BEEN CONFIRMED, SUGGEST FOLLOW UP TESTING OF ANTITHROMBIN III LEVELS.   Marland Kitchen aPTT 06/30/2015 53* 24 - 36 seconds Final   Comment:        IF BASELINE aPTT IS ELEVATED, SUGGEST PATIENT RISK ASSESSMENT BE USED TO DETERMINE APPROPRIATE ANTICOAGULANT THERAPY.   . WBC 06/30/2015 7.2  3.8 - 10.6 K/uL Final  . RBC 06/30/2015 5.04  4.40 - 5.90 MIL/uL Final  . Hemoglobin 06/30/2015 14.0  13.0 - 18.0 g/dL Final  . HCT 06/30/2015 41.7  40.0 - 52.0 %  Final  . MCV 06/30/2015 82.7  80.0 - 100.0 fL Final  . MCH 06/30/2015 27.7  26.0 - 34.0 pg Final  . MCHC 06/30/2015 33.6  32.0 - 36.0 g/dL Final  . RDW 06/30/2015 14.9* 11.5 - 14.5 % Final  . Platelets 06/30/2015 245  150 - 440 K/uL Final  . aPTT 06/30/2015 29  24 - 36 seconds Final     STUDIES: Dg Chest 1 View  06/30/2015   CLINICAL DATA:  Pneumonia, emphysema and pulmonary fibrosis, left-sided lung mass  EXAM: CHEST  1 VIEW  COMPARISON:  CT scan of the chest of June 28, 2015 and chest x-ray of the same day  FINDINGS: There is a persistent left perihilar masslike density. The lungs are well-expanded. The interstitial markings remain increased bilaterally. The heart is normal in size.  The pulmonary vascularity is not engorged. The trachea is midline. The bony thorax exhibits no acute abnormality.  IMPRESSION: Stable appearance of the chest since yesterday's study with persistent left hilar masslike density with known parenchymal abnormality in the left upper and lower lobes. COPD and pulmonary fibrosis.   Electronically Signed   By: David  Martinique M.D.   On: 06/30/2015 07:38   Ct Chest W Contrast  06/28/2015   CLINICAL DATA:  Hemoptysis beginning yesterday, history atrial fibrillation on Xarelto, COPD, pneumonia, hypertension, stroke  EXAM: CT CHEST WITH CONTRAST  TECHNIQUE: Multidetector CT imaging of the chest was performed during intravenous contrast administration. Sagittal and coronal MPR images reconstructed from axial data set.  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  Scattered atherosclerotic calcifications aorta and coronary arteries.  Pulmonary arteries grossly patent on nondedicated exam.  Enlarged RIGHT hilar lymph node 15 mm short axis image 30, increased.  Enlarged RIGHT peritracheal node 12 mm short axis image 24 previously 14 mm.  Additional scattered normal  sized mediastinal and hilar nodes.  Thyroid gland in visualized inferior cervical region unremarkable.  Visualized upper abdomen normal appearance.  Minimal pericardial fluid.  Small LEFT pleural effusion increased since previous exam.  Persisting consolidation involving posterior LEFT upper lobe and superior segment LEFT lower lobe, extending to hilum.  Associated narrowing of LEFT lower lobe bronchi and scattered peribronchial thickening.  Malignancy not excluded.  Underlying emphysematous changes with scattered peripheral subpleural fibrosis in both lungs.  Increased nodule at medial RIGHT upper lobe 8 mm diameter image 9 previously 5 mm.  New RIGHT lower lobe nodular density 8 mm diameter image 44.  Stable tiny nodular density RIGHT lower lobe 3 mm diameter image 45.  New small LEFT lung nodules images 23, 24, 26, and 33.  Slightly increased size of tiny LEFT upper lobe nodule image 27.  No pneumothorax or acute osseous findings.  Small lipoma RIGHT latissimus dorsi.  IMPRESSION: Increased opacification of the posterior mid LEFT lung involving posterior LEFT upper lobe and superior segment of the LEFT lower lobe extending to pulmonary hilum; the presence of progressive/non resolving pulmonary infiltrate is concerning for pulmonary neoplasm and bronchoscopy with tissue diagnosis is recommended.  Underlying emphysematous changes with increased peribronchial thickening in particularly LEFT lung since previous exam.  Multiple tiny pulmonary nodules, increasing or new since previous exam cannot exclude metastatic disease.  RIGHT paratracheal and increased RIGHT hilar adenopathy.   Electronically Signed   By: MLavonia DanaM.D.   On: 06/28/2015 18:13   Dg Chest Portable 1 View  06/28/2015   CLINICAL DATA:  Acute onset of hemoptysis. Atrial fibrillation. Initial encounter.  EXAM: PORTABLE CHEST - 1 VIEW  COMPARISON:  Chest radiograph performed 04/21/2015, and CT of the chest performed 04/25/2015  FINDINGS: There is  increasing left perihilar airspace opacification, which may reflect a chronic atypical infection, given hemoptysis and gradual worsening from the prior study. Mild patchy right basilar airspace opacity is noted. No definite pleural effusion or pneumothorax is seen.  The cardiomediastinal silhouette is borderline normal in size. No acute osseous abnormalities are identified.  IMPRESSION: Increasing left perihilar airspace opacification, which may reflect a chronic atypical infection or possibly an unusual malignancy, given hemoptysis and gradual worsening from the prior study. Mild patchy right basilar airspace opacity seen. Pulmonary consultation was recommended per the prior CT, for possible bronchoscopy and tissue  sampling.   Electronically Signed   By: Garald Balding M.D.   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 Based on CT scan tumor could be T4 invading mediastinotomy however further staging workup is needed by PET scan Considering atrial fibrillation previous history of stroke Patient may be unresectable based on medical criteria however surgical opinion with Dr. Genevive Bi has been arranged Considering patient's having hemoptysis I will also ask Dr. Baruch Gouty and discuss case with Dr. Donella Stade regarding starting radiation therapy Case will be discussed in tumor conference up to PET scan is available Further markers will be ordered with EGFR and alk mutation (as per pathologist there is enough material to do this markers   PLAN: If patient is found unresectable then combination of chemotherapy with carboplatinum and Taxol would be considered. She will be reevaluated in next few days up to PET scan and surgical opinion  Patient expressed understanding and was in agreement with this plan. He also understands that He can call clinic at any time with any questions, concerns, or complaints.    Carcinoma of left lung   Staging form: Lung, AJCC 7th Edition      Clinical: Stage IIIA (T4, N0, M0) - Signed by Forest Gleason, MD on 07/05/2015   Forest Gleason, MD   07/05/2015 8:22 AM

## 2015-07-06 ENCOUNTER — Encounter: Payer: Self-pay | Admitting: *Deleted

## 2015-07-06 ENCOUNTER — Ambulatory Visit
Admission: RE | Admit: 2015-07-06 | Discharge: 2015-07-06 | Disposition: A | Discharge status: Home / Self Care | Payer: Medicare Other | Source: Ambulatory Visit | Attending: Oncology | Admitting: Oncology

## 2015-07-06 ENCOUNTER — Inpatient Hospital Stay (HOSPITAL_BASED_OUTPATIENT_CLINIC_OR_DEPARTMENT_OTHER): Payer: Medicare Other | Admitting: Oncology

## 2015-07-06 ENCOUNTER — Inpatient Hospital Stay (HOSPITAL_BASED_OUTPATIENT_CLINIC_OR_DEPARTMENT_OTHER): Payer: Medicare Other | Admitting: Cardiothoracic Surgery

## 2015-07-06 ENCOUNTER — Encounter: Payer: Self-pay | Admitting: Cardiothoracic Surgery

## 2015-07-06 VITALS — BP 121/70 | HR 65 | Temp 98.4°F | Resp 20 | Ht 70.0 in | Wt 188.3 lb

## 2015-07-06 DIAGNOSIS — I4891 Unspecified atrial fibrillation: Secondary | ICD-10-CM

## 2015-07-06 DIAGNOSIS — Z7901 Long term (current) use of anticoagulants: Secondary | ICD-10-CM

## 2015-07-06 DIAGNOSIS — Z79899 Other long term (current) drug therapy: Secondary | ICD-10-CM

## 2015-07-06 DIAGNOSIS — J449 Chronic obstructive pulmonary disease, unspecified: Secondary | ICD-10-CM

## 2015-07-06 DIAGNOSIS — C3490 Malignant neoplasm of unspecified part of unspecified bronchus or lung: Secondary | ICD-10-CM

## 2015-07-06 DIAGNOSIS — J90 Pleural effusion, not elsewhere classified: Secondary | ICD-10-CM | POA: Diagnosis not present

## 2015-07-06 DIAGNOSIS — M25552 Pain in left hip: Secondary | ICD-10-CM | POA: Diagnosis not present

## 2015-07-06 DIAGNOSIS — I2510 Atherosclerotic heart disease of native coronary artery without angina pectoris: Secondary | ICD-10-CM | POA: Insufficient documentation

## 2015-07-06 DIAGNOSIS — R531 Weakness: Secondary | ICD-10-CM

## 2015-07-06 DIAGNOSIS — E780 Pure hypercholesterolemia: Secondary | ICD-10-CM

## 2015-07-06 DIAGNOSIS — Z8673 Personal history of transient ischemic attack (TIA), and cerebral infarction without residual deficits: Secondary | ICD-10-CM

## 2015-07-06 DIAGNOSIS — R5383 Other fatigue: Secondary | ICD-10-CM

## 2015-07-06 DIAGNOSIS — C3412 Malignant neoplasm of upper lobe, left bronchus or lung: Secondary | ICD-10-CM | POA: Diagnosis not present

## 2015-07-06 DIAGNOSIS — I639 Cerebral infarction, unspecified: Secondary | ICD-10-CM | POA: Diagnosis not present

## 2015-07-06 DIAGNOSIS — C3492 Malignant neoplasm of unspecified part of left bronchus or lung: Secondary | ICD-10-CM

## 2015-07-06 DIAGNOSIS — F419 Anxiety disorder, unspecified: Secondary | ICD-10-CM

## 2015-07-06 DIAGNOSIS — C3432 Malignant neoplasm of lower lobe, left bronchus or lung: Secondary | ICD-10-CM | POA: Diagnosis not present

## 2015-07-06 DIAGNOSIS — C3410 Malignant neoplasm of upper lobe, unspecified bronchus or lung: Secondary | ICD-10-CM

## 2015-07-06 DIAGNOSIS — I10 Essential (primary) hypertension: Secondary | ICD-10-CM

## 2015-07-06 LAB — BODY FLUID CULTURE

## 2015-07-06 LAB — GLUCOSE, CAPILLARY: Glucose-Capillary: 108 mg/dL — ABNORMAL HIGH (ref 65–99)

## 2015-07-06 MED ORDER — MIRTAZAPINE 15 MG PO TABS: 15.0000 mg | ORAL_TABLET | Freq: Every day | ORAL | Status: DC

## 2015-07-06 MED ORDER — FLUDEOXYGLUCOSE F - 18 (FDG) INJECTION
12.0000 | Freq: Once | INTRAVENOUS | Status: AC | PRN
  Administered 2015-07-06: 12.25 via INTRAVENOUS

## 2015-07-06 NOTE — Progress Notes (Signed)
Patient here as a referral from Dr. Oliva Bustard for lung CA. Patient states he has lost 35 pounds in the past three months.

## 2015-07-06 NOTE — Progress Notes (Signed)
Patient ID: Curtis Freeman, male   DOB: [birth year], 74 y.o.   MRN: [id number]  Chief Complaint  Patient presents with  . Advice Only    Referral    Referred By Dr. Ramond Craver he Reason for Referral review surgical candidacy for lung cancer  HPI Location, Quality, Duration, Severity, Timing, Context, Modifying Factors, Associated Signs and Symptoms.  Curtis Freeman is a 74 y.o. male.  I have personally seen and examined this patient. I have independently reviewed his x-rays. I discussed his care with Dr. Ramond Craver he.  This is a 74 year old gentleman who is scheduled to undergo a prostate needle biopsy in the spring of 2016. The patient is on several toe for a history of atrial fibrillation this was stopped in preparation for the biopsy. The biopsy was scheduled for the next day but the patient developed a stroke and required a several month hospitalization.  During the course of that time he had a CT scan performed of his head as well as an MRI which did reveal the stroke. Further evaluation consisted of a chest x-ray which was abnormal followed by a CT scan showing a left perihilar mass. After several months of completing his rehabilitation he underwent another bronchoscopy and CT scan. This showed that CT scan and now demonstrated a markedly enlarged mass in the left lung involving both upper and lower lobes as well as multiple mediastinal nodes. The patient underwent bronchoscopy revealing a non-small cell carcinoma the lung. He also describes an pain in his left hip for which she states it came about I hit hitting his hip in the car. However the PET scan shows intense uptake in that area most consistent with metastatic disease. Patient does get short of breath with minimal activities. Also has a history of hemoptysis while on the Xarelto. He recently had one emergency room visit for that. He currently has no further hemoptysis. He does have a history of loss of appetite palpitations shortness of breath  and swelling of his lower extremities. He complains of a cough and some wheezing. He does have joint pain on the left.    Past Medical History  Diagnosis Date  . Hypertension   . Pneumonia   . Cancer     lung mass  . Stroke   . COPD (chronic obstructive pulmonary disease)   . Carcinoma of left lung 07/05/2015  . Anorexia 03/2016  . Stroke 03/2016    History reviewed. No pertinent past surgical history.  Family History  Problem Relation Age of Onset  . Hypertension Mother   . Stroke Paternal Grandfather     Social History History  Substance Use Topics  . Smoking status: Former Smoker    Quit date: 06/27/2005  . Smokeless tobacco: Not on file  . Alcohol Use: Not on file    Allergies  Allergen Reactions  . Celebrex [Celecoxib]   . Zolpidem Tartrate Anxiety    Current Outpatient Prescriptions  Medication Sig Dispense Refill  . albuterol (PROVENTIL) (2.5 MG/3ML) 0.083% nebulizer solution Take 3 mLs (2.5 mg total) by nebulization every 4 (four) hours as needed for wheezing or shortness of breath. 75 mL 12  . atorvastatin (LIPITOR) 20 MG tablet Take 20 mg by mouth daily.    . Fluticasone-Salmeterol (ADVAIR DISKUS) 250-50 MCG/DOSE AEPB Inhale 1 puff into the lungs 2 (two) times daily.    . hydrochlorothiazide (HYDRODIURIL) 12.5 MG tablet Take 12.5 mg by mouth daily.    Marland Kitchen LORazepam (ATIVAN) 0.5 MG tablet Take 0.5  mg by mouth every 6 (six) hours as needed for anxiety.    Marland Kitchen losartan (COZAAR) 100 MG tablet Take 100 mg by mouth daily.    . rivaroxaban (XARELTO) 20 MG TABS tablet Take 20 mg by mouth daily with supper.    . VENTOLIN HFA 108 (90 BASE) MCG/ACT inhaler Inhale 1 puff into the lungs every 4 (four) hours as needed for wheezing or shortness of breath.   0  . azithromycin (ZITHROMAX) 250 MG tablet 1 tab po qd x 3 days (Patient not taking: Reported on 07/04/2015) 3 each 0  . cefUROXime (CEFTIN) 250 MG tablet Take 1 tablet (250 mg total) by mouth 2 (two) times daily with a meal.  (Patient not taking: Reported on 07/06/2015) 20 tablet 0  . zolpidem (AMBIEN) 5 MG tablet Take 1 tablet by mouth at bedtime as needed for sleep.      No current facility-administered medications for this visit.      Review of Systems A complete review of systems was asked and was negative except for the following positive findingsplease see the above history and physical for pertinent positive findings. These included bone pain shortness of breath weight loss loss of appetite and hemoptysis.  Blood pressure 121/70, pulse 65, temperature 98.4 F (36.9 C), temperature source Tympanic, resp. rate 20, height '5\' 10"'$  (1.778 m), weight 188 lb 4.4 oz (85.4 kg), SpO2 95 %.  Physical Exam CONSTITUTIONAL:  Pleasant, well-developed, well-nourished, and in no acute distress. EYES: Pupils equal and reactive to light, Sclera non-icteric EARS, NOSE, MOUTH AND THROAT:  The oropharynx was clear.  Dentition is good repair.  Oral mucosa pink and moist. LYMPH NODES:  Lymph nodes in the neck and axillae were normal RESPIRATORY:  Lungs were clear.  Normal respiratory effort without pathologic use of accessory muscles of respiration CARDIOVASCULAR: Heart was irregular without murmurs.  There were no carotid bruits. GI: The abdomen was soft, nontender, and nondistended. There were no palpable masses. There was no hepatosplenomegaly. There were normal bowel sounds in all quadrants. GU:  Rectal deferred.   MUSCULOSKELETAL:  Normal muscle strength and tone.  No clubbing or cyanosis.   SKIN:  There were no pathologic skin lesions.  There were no nodules on palpation. NEUROLOGIC:  Sensation is normal.  Cranial nerves are grossly intact. He had good motor strength on the right which was only very slightly less than on the left PSYCH:  Oriented to person, place and time.  Mood and affect are normal.  Data Reviewed CT scan and PET scan  I have personally reviewed the patient's imaging, laboratory findings and medical  records.    Assessment    I have personally seen and examined this patient as well as review the CT scans and PET scans. Believe that this lesion is unresectable as it represents a stage IV carcinoma the lung. Currently he does not have any signs of hemoptysis. Therefore I believe I would continue with chemotherapy and radiation therapy is planned.     Plan    I do not recommend surgery at this point. I would recommend chemotherapy and radiation therapy. I reviewed this with the patient has wife and his daughter. All their questions were answered. No follow-up was made.        Nestor Lewandowsky, MD 07/06/2015, 2:31 PM

## 2015-07-07 ENCOUNTER — Inpatient Hospital Stay: Payer: Medicare Other | Admitting: Internal Medicine

## 2015-07-07 ENCOUNTER — Other Ambulatory Visit: Payer: Self-pay | Admitting: Internal Medicine

## 2015-07-07 ENCOUNTER — Encounter: Payer: Self-pay | Admitting: Oncology

## 2015-07-07 ENCOUNTER — Ambulatory Visit (INDEPENDENT_AMBULATORY_CARE_PROVIDER_SITE_OTHER): Payer: Medicare Other | Admitting: Internal Medicine

## 2015-07-07 DIAGNOSIS — R0600 Dyspnea, unspecified: Secondary | ICD-10-CM

## 2015-07-07 DIAGNOSIS — R918 Other nonspecific abnormal finding of lung field: Secondary | ICD-10-CM | POA: Diagnosis not present

## 2015-07-07 NOTE — Progress Notes (Signed)
PFT performed today. 

## 2015-07-07 NOTE — Progress Notes (Signed)
[place] @ [facility] Telephone:([phone]) [phone]  Fax:([phone]) [place] OB: [birth year]  MR#: [id number]  JGG#:[id number]  Patient Care Team: Lavera Guise, MD as PCP - General (Internal Medicine)  CHIEF COMPLAINT:  No chief complaint on file.   VISIT DIAGNOSIS:     ICD-9-CM ICD-10-CM   1. Carcinoma of left lung 162.9 C34.92 mirtazapine (REMERON) 15 MG tablet     Oncology History   1.  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  Patient has atrial fibrillation on a long-term xeralto 2.  History of cerebrovascular accident when he was taken off of xeralto 3.  Hypercholesterolemia 4.  Hypertension INTERVAL HISTORY:  74 year old gentleman started having cough and hemoptysis a week ago was admitted in the hospital.  Patient had a CT scan which revealed left lower lobe mass.  Patient underwent bronchoscopy.  Patient was started again on 0 all toe bronchoscopy revealed left lower lobe (and lingula) mass positive for malignancy.  Non-small cell type.  Some of the biopsy of subcarinal lymph nodes were negative for malignancy.  0 all toe patient does not have any more hemoptysis at present time.  No chest pain.  Patient had a pain in the left hip  2 weeks duration Patient was referred to me for further evaluation and management regarding carcinoma of lung In July, 2016 Patient is here for further evaluation and treatment consideration.  Continues to have left hip pain.  No nausea.  No vomiting.  That scan shows multiple metastases in bilateral lymph node as well as a bone. No hemoptysis at present time.  Patient is starting radiation therapy on  Monday  REVIEW OF SYSTEMS:   Gen. status: Extremely apprehensive individual.  Not in any acute distress.    general status: Patient is feeling weak and tired.  No change in a performance status.  No chills.  No fever. HEENT:   No evidence of stomatitis Lungs as described above in history of present illness history of cough, hemoptysis patient is former smoker has COPD using inhalers Cardiac: No chest pain or paroxysmal nocturnal dyspnea.  History of atrial fibrillation GI: No nausea no vomiting no diarrhea no abdominal pain Skin: No rash Lower extremity no swelling Neurological system: No tingling.  No numbness.  No other focal signs.  History of cerebrovascular accident in April with residual weakness Musculoskeletal system no bony pains GU: Patient had a high PSA but biopsy was negative  As per HPI. Otherwise, a complete review of systems is negatve.  PAST MEDICAL HISTORY: Past Medical History  Diagnosis Date  . Hypertension   . Pneumonia   . Cancer     lung mass  . Stroke   . COPD (chronic obstructive pulmonary disease)   . Carcinoma of left lung 07/05/2015  . Anorexia 03/2016  . Stroke 03/2016    PAST SURGICAL HISTORY:  had a cataract surgery in the past.  Has a large prostate biopsy was done.   FAMILY HISTORY There is no significant family history of breast cancer, ovarian cancer, colon cancer  grandmother had stomach cancer family history of heart disease  ADVANCED DIRECTIVES:  Patient does have advance healthcare directive, Patient   does not desire to make any changes  HEALTH MAINTENANCE: History  Substance Use Topics  . Smoking status: Former Smoker    Quit date: 06/27/2005  . Smokeless tobacco: Not on file  . Alcohol Use: Not on file     Allergies  Allergen Reactions  . Celebrex [Celecoxib]   . Zolpidem Tartrate Anxiety    Current Outpatient Prescriptions  Medication Sig Dispense Refill  . albuterol (PROVENTIL) (2.5 MG/3ML) 0.083% nebulizer  solution Take 3 mLs (2.5 mg total) by nebulization every 4 (four) hours as needed for wheezing or shortness of breath. 75 mL 12  . atorvastatin (LIPITOR) 20 MG tablet Take 20 mg by mouth daily.    Marland Kitchen azithromycin (ZITHROMAX) 250 MG tablet 1 tab po qd x 3 days (Patient not taking: Reported on 07/04/2015) 3 each 0  . cefUROXime (CEFTIN) 250 MG tablet Take 1 tablet (250 mg total) by mouth 2 (two) times daily with a meal. (Patient not taking: Reported on 07/06/2015) 20 tablet 0  . Fluticasone-Salmeterol (ADVAIR DISKUS) 250-50 MCG/DOSE AEPB Inhale 1 puff into the lungs 2 (two) times daily.    . hydrochlorothiazide (HYDRODIURIL) 12.5 MG tablet Take 12.5 mg by mouth daily.    Marland Kitchen LORazepam (ATIVAN) 0.5 MG tablet Take 0.5 mg by mouth every 6 (six) hours as needed for anxiety.    Marland Kitchen losartan (COZAAR) 100 MG tablet Take 100 mg by mouth daily.    . mirtazapine (REMERON) 15 MG tablet Take 1 tablet (15 mg total) by mouth at bedtime. 30 tablet 3  . rivaroxaban (XARELTO) 20 MG TABS tablet Take 20 mg by mouth daily with supper.    . VENTOLIN HFA 108 (90 BASE) MCG/ACT inhaler Inhale 1 puff into the lungs every 4 (four) hours as needed for wheezing or shortness of breath.   0   No current facility-administered medications for this visit.    OBJECTIVE: PHYSICAL EXAM:GENERAL:  Well developed, well nourished, sitting comfortably in the exam room in no acute distress.  Patient is walking with the help of cane  MENTAL STATUS:  Alert and oriented to person, place and time. HEAD.  Normocephalic, atraumatic, face symmetric, no Cushingoid features. EYES:    Pupils equal round and reactive to light and accomodation.  No conjunctivitis or scleral icterus. ENT:  Oropharynx clear without lesion.  Tongue normal. Mucous membranes moist.  RESPIRATORY:  Diminished and entry on both sides.  Emphysematous chest.  His dullness on percussion on the left lower chest CARDIOVASCULAR:  Regular rate and rhythm without murmur, rub or  gallop. Abdominal exam revealed normal bowel sounds. The abdomen was soft, non-tender, and without masses, organomegaly, or appreciable enlargement of the abdominal aorta. BACK:  No CVA tenderness.  No tenderness on percussion of the back or rib cage.  Intermittent tenderness in the left hip SKIN:  No rashes, ulcers or lesions. EXTREMITIES: No edema, no skin discoloration or tenderness.  No palpable cords. LYMPH NODES: No palpable cervical, supraclavicular, axillary or inguinal adenopathy  NEUROLOGICAL: She has residual weakness on the right side walking with the help of cane PSYCH:  Appropriate.very apprehensive.    There were no vitals filed for this visit.   There is no weight on file to calculate BMI.    ECOG FS:1 - Symptomatic but completely ambulatory  LAB RESULTS:  Hospital Outpatient Visit on 07/06/2015  Component Date Value Ref Range Status  . Glucose-Capillary 07/06/2015 108* 65 - 99 mg/dL  Final     STUDIES: Dg Chest 1 View  06/30/2015   CLINICAL DATA:  Pneumonia, emphysema and pulmonary fibrosis, left-sided lung mass  EXAM: CHEST  1 VIEW  COMPARISON:  CT scan of the chest of June 28, 2015 and chest x-ray of the same day  FINDINGS: There is a persistent left perihilar masslike density. The lungs are well-expanded. The interstitial markings remain increased bilaterally. The heart is normal in size. The pulmonary vascularity is not engorged. The trachea is midline. The bony thorax exhibits no acute abnormality.  IMPRESSION: Stable appearance of the chest since yesterday's study with persistent left hilar masslike density with known parenchymal abnormality in the left upper and lower lobes. COPD and pulmonary fibrosis.   Electronically Signed   By: David  Martinique M.D.   On: 06/30/2015 07:38   Ct Chest W Contrast  06/28/2015   CLINICAL DATA:  Hemoptysis beginning yesterday, history atrial fibrillation on Xarelto, COPD, pneumonia, hypertension, stroke  EXAM: CT CHEST WITH CONTRAST   TECHNIQUE: Multidetector CT imaging of the chest was performed during intravenous contrast administration. Sagittal and coronal MPR images reconstructed from axial data set.  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  Scattered atherosclerotic calcifications aorta and coronary arteries.  Pulmonary arteries grossly patent on nondedicated exam.  Enlarged RIGHT hilar lymph node 15 mm short axis image 30, increased.  Enlarged RIGHT peritracheal node 12 mm short axis image 24 previously 14 mm.  Additional scattered normal sized mediastinal and hilar nodes.  Thyroid gland in visualized inferior cervical region unremarkable.  Visualized upper abdomen normal appearance.  Minimal pericardial fluid.  Small LEFT pleural effusion increased since previous exam.  Persisting consolidation involving posterior LEFT upper lobe and superior segment LEFT lower lobe, extending to hilum.  Associated narrowing of LEFT lower lobe bronchi and scattered peribronchial thickening.  Malignancy not excluded.  Underlying emphysematous changes with scattered peripheral subpleural fibrosis in both lungs.  Increased nodule at medial RIGHT upper lobe 8 mm diameter image 9 previously 5 mm.  New RIGHT lower lobe nodular density 8 mm diameter image 44.  Stable tiny nodular density RIGHT lower lobe 3 mm diameter image 45.  New small LEFT lung nodules images 23, 24, 26, and 33.  Slightly increased size of tiny LEFT upper lobe nodule image 27.  No pneumothorax or acute osseous findings.  Small lipoma RIGHT latissimus dorsi.  IMPRESSION: Increased opacification of the posterior mid LEFT lung involving posterior LEFT upper lobe and superior segment of the LEFT lower lobe extending to pulmonary hilum; the presence of progressive/non resolving pulmonary infiltrate is concerning for pulmonary neoplasm and bronchoscopy with tissue  diagnosis is recommended.  Underlying emphysematous changes with increased peribronchial thickening in particularly LEFT lung since previous exam.  Multiple tiny pulmonary nodules, increasing or new since previous exam cannot exclude metastatic disease.  RIGHT paratracheal and increased RIGHT hilar adenopathy.   Electronically Signed   By: MLavonia DanaM.D.   On: 06/28/2015 18:13   Nm Pet Image Initial (pi) Skull Base To Thigh  07/06/2015   CLINICAL DATA:  Initial treatment strategy for lung cancer. Staging examination.  EXAM: NUCLEAR MEDICINE PET SKULL BASE TO THIGH  TECHNIQUE: 12.25 mCi F-18 FDG was injected intravenously. Full-ring PET imaging was performed from the skull base to thigh after the radiotracer. CT data was obtained and used for attenuation correction and anatomic localization.  FASTING BLOOD GLUCOSE:  Value: 108 mg/dl  COMPARISON:  Chest CT 06/28/2015.  FINDINGS: NECK  Left-sided submandibular lymph node measuring only 8 mm in short axis (image 37 of series 3) is hypermetabolic (SUVmax = 4.4). No other hypermetabolic lymph nodes in the neck.  CHEST  Large infiltrative hypermetabolic (SUVmax = 40.9)WJXB in the left lower lobe, with epicenter in the superior segment of the left lower lobe. This mass has progressively enlarged compared to prior examinations, and is now clearly crossing the left major fissure into the posterior aspect of the left upper lobe. This mass is very infiltrative in appearance, and surrounded by some adjacent postobstructive changes, and therefore difficult to discretely measure, however, this measures at least 7.2 x 6.4 x 12.9 cm. Several prominent but nonenlarged left supraclavicular lymph nodes are noted measuring up to 7 mm in short axis, and are suspicious, but not clearly hypermetabolic at this time (SUVmax = 2.2). Small left-sided pleural effusion (likely malignant) which appears loculated in the medial aspect of the left hemithorax. 7 mm nodule in the medial aspect of  the apex of the right upper lobe (image 76 of series 3) demonstrates no internal metabolic activity. Numerous enlarged hypermetabolic mediastinal and left hilar lymph nodes are noted measuring up to 12 mm in short axis in the right paratracheal (SUVmax = 5.4) nodal station (image 97 of series 3), compatible with widespread nodal metastasis.  ABDOMEN/PELVIS  No abnormal hypermetabolic activity within the liver, pancreas, adrenal glands, or spleen. No hypermetabolic lymph nodes in the abdomen or pelvis. Liver has a slightly shrunken appearance and nodular contour, suggestive of cirrhosis. 1.9 cm low-attenuation lesion in the medial aspect of the interpolar region of the right kidney is similar to prior studies, previously characterized as a cyst. Atherosclerosis throughout the abdominal and pelvic vasculature, without evidence of aneurysm. No significant volume of ascites. No pneumoperitoneum. Prostate gland is enlarged measuring 4.7 x 5.7 cm, and there is a focus of hypermetabolism in the posterior aspect of the peripheral zone on the right side (SUVmax = 4.7).  SKELETON  Hypermetabolic (SUVmax = 5.9) lytic lesion with overlying soft tissue component measuring 2.9 x 2.2 cm in the left ilium (image 210 of series 3) compatible with an osseous metastasis.  IMPRESSION: 1. Findings, as above, compatible with stage IV (T4, N3, M1b) lung cancer. The primary left lower lobe lesion has now invaded into the posterior aspect of the left upper lobe, and there is a small left-sided pleural effusion which appears partially loculated (likely malignant), with left hilar, bilateral mediastinal, none probable left supraclavicular lymphadenopathy, as well as a metastatic lesion to the left ilium. There is also a nonenlarged but mildly hypermetabolic left-sided submandibular lymph node which is nonspecific. Metastatic disease in this distribution would be unusual. 2. Small focus of hypermetabolism in the posterior aspect of the  peripheral zone of the prostate gland on the right side suspicious for potential prostate neoplasm. 3. Atherosclerosis, including left main and 3 vessel coronary artery disease. 4. Additional incidental findings, as above.   Electronically Signed   By: Vinnie Langton M.D.   On: 07/06/2015 12:05   Dg Chest Portable 1 View  06/28/2015   CLINICAL DATA:  Acute onset of hemoptysis. Atrial fibrillation. Initial encounter.  EXAM: PORTABLE CHEST - 1 VIEW  COMPARISON:  Chest radiograph performed 04/21/2015, and CT of the chest performed 04/25/2015  FINDINGS: There is increasing left perihilar airspace opacification, which may reflect a chronic atypical infection, given hemoptysis and gradual worsening from  the prior study. Mild patchy right basilar airspace opacity is noted. No definite pleural effusion or pneumothorax is seen.  The cardiomediastinal silhouette is borderline normal in size. No acute osseous abnormalities are identified.  IMPRESSION: Increasing left perihilar airspace opacification, which may reflect a chronic atypical infection or possibly an unusual malignancy, given hemoptysis and gradual worsening from the prior study. Mild patchy right basilar airspace opacity seen. Pulmonary consultation was recommended per the prior CT, for possible bronchoscopy and tissue sampling.   Electronically Signed   By: Garald Balding M.D.   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 Based on CT scan tumor could be T4 invading mediastinotomy however further staging workup is needed by PET scan Considering atrial fibrillation previous history of stroke Patient may be unresectable based on medical criteria however surgical opinion with Dr. Genevive Bi has been arranged I discussed following options of treatment which included radiation therapy chest followed by systemic therapy versus starting systemic therapy.  EGFR and   ALK IS pending. We will also start patient on XGEVA  because of bone metastases Total duration of visit was 30 minutes.  50% or more time was spent in counseling patient and family regarding prognosis and options of treatment and available resources   PET scan has been reviewed independently Case was discussed today in case conference   Carcinoma of left lung   Staging form: Lung, AJCC 7th Edition     Clinical: Stage IIIA (T4, N0, M0) - Signed by Forest Gleason, MD on 07/05/2015  Forest Gleason, MD   07/07/2015 4:59 PM

## 2015-07-07 NOTE — Progress Notes (Signed)
  Oncology Nurse Navigator Documentation    Navigator Encounter Type: Clinic/MDC (07/07/15 0600)               With patient and family for thoracic surgery consultation and medical oncology follow up including PET scan results. Will continue to follow as continue to plan for treatment with chemo/xrt.

## 2015-07-10 ENCOUNTER — Inpatient Hospital Stay: Payer: Medicare Other | Attending: Oncology

## 2015-07-10 ENCOUNTER — Ambulatory Visit
Admission: RE | Admit: 2015-07-10 | Discharge: 2015-07-10 | Disposition: A | Discharge status: Home / Self Care | Payer: Medicare Other | Source: Ambulatory Visit | Attending: Radiation Oncology | Admitting: Radiation Oncology

## 2015-07-10 DIAGNOSIS — Z7901 Long term (current) use of anticoagulants: Secondary | ICD-10-CM | POA: Insufficient documentation

## 2015-07-10 DIAGNOSIS — Z7952 Long term (current) use of systemic steroids: Secondary | ICD-10-CM | POA: Diagnosis not present

## 2015-07-10 DIAGNOSIS — J449 Chronic obstructive pulmonary disease, unspecified: Secondary | ICD-10-CM | POA: Diagnosis not present

## 2015-07-10 DIAGNOSIS — M25552 Pain in left hip: Secondary | ICD-10-CM | POA: Insufficient documentation

## 2015-07-10 DIAGNOSIS — C7951 Secondary malignant neoplasm of bone: Secondary | ICD-10-CM | POA: Diagnosis not present

## 2015-07-10 DIAGNOSIS — I10 Essential (primary) hypertension: Secondary | ICD-10-CM | POA: Insufficient documentation

## 2015-07-10 DIAGNOSIS — I4891 Unspecified atrial fibrillation: Secondary | ICD-10-CM | POA: Insufficient documentation

## 2015-07-10 DIAGNOSIS — R5383 Other fatigue: Secondary | ICD-10-CM | POA: Insufficient documentation

## 2015-07-10 DIAGNOSIS — Z79899 Other long term (current) drug therapy: Secondary | ICD-10-CM | POA: Insufficient documentation

## 2015-07-10 DIAGNOSIS — Z87891 Personal history of nicotine dependence: Secondary | ICD-10-CM | POA: Diagnosis not present

## 2015-07-10 DIAGNOSIS — R630 Anorexia: Secondary | ICD-10-CM | POA: Insufficient documentation

## 2015-07-10 DIAGNOSIS — R918 Other nonspecific abnormal finding of lung field: Secondary | ICD-10-CM | POA: Diagnosis not present

## 2015-07-10 DIAGNOSIS — R531 Weakness: Secondary | ICD-10-CM | POA: Diagnosis not present

## 2015-07-10 DIAGNOSIS — Z8673 Personal history of transient ischemic attack (TIA), and cerebral infarction without residual deficits: Secondary | ICD-10-CM | POA: Diagnosis not present

## 2015-07-10 DIAGNOSIS — R0602 Shortness of breath: Secondary | ICD-10-CM | POA: Insufficient documentation

## 2015-07-10 DIAGNOSIS — C3492 Malignant neoplasm of unspecified part of left bronchus or lung: Secondary | ICD-10-CM

## 2015-07-10 DIAGNOSIS — C3432 Malignant neoplasm of lower lobe, left bronchus or lung: Secondary | ICD-10-CM | POA: Insufficient documentation

## 2015-07-10 DIAGNOSIS — Z510 Encounter for antineoplastic radiation therapy: Secondary | ICD-10-CM | POA: Diagnosis present

## 2015-07-10 DIAGNOSIS — R634 Abnormal weight loss: Secondary | ICD-10-CM | POA: Insufficient documentation

## 2015-07-10 MED ORDER — DIPHENHYDRAMINE HCL 50 MG/ML IJ SOLN: 50.0000 mg | Freq: Once | INTRAMUSCULAR | Status: AC | PRN

## 2015-07-10 MED ORDER — DIPHENHYDRAMINE HCL 50 MG/ML IJ SOLN: 25.0000 mg | Freq: Once | INTRAMUSCULAR | Status: AC | PRN

## 2015-07-10 MED ORDER — METHYLPREDNISOLONE SODIUM SUCC 125 MG IJ SOLR: 125.0000 mg | Freq: Once | INTRAMUSCULAR | Status: AC | PRN

## 2015-07-10 MED ORDER — DENOSUMAB 120 MG/1.7ML ~~LOC~~ SOLN
120.0000 mg | Freq: Once | SUBCUTANEOUS | Status: AC
  Administered 2015-07-10: 120 mg via SUBCUTANEOUS
  Filled 2015-07-10: qty 1.7

## 2015-07-10 MED ORDER — ALBUTEROL SULFATE (2.5 MG/3ML) 0.083% IN NEBU: 2.5000 mg | INHALATION_SOLUTION | Freq: Once | RESPIRATORY_TRACT | Status: AC | PRN

## 2015-07-10 MED ORDER — SODIUM CHLORIDE 0.9 % IV SOLN
Freq: Once | INTRAVENOUS | Status: AC | PRN
  Filled 2015-07-10: qty 1000

## 2015-07-11 DIAGNOSIS — Z510 Encounter for antineoplastic radiation therapy: Secondary | ICD-10-CM | POA: Diagnosis not present

## 2015-07-12 ENCOUNTER — Ambulatory Visit
Admission: RE | Admit: 2015-07-12 | Discharge: 2015-07-12 | Disposition: A | Discharge status: Home / Self Care | Payer: Medicare Other | Source: Ambulatory Visit | Attending: Radiation Oncology | Admitting: Radiation Oncology

## 2015-07-12 DIAGNOSIS — Z510 Encounter for antineoplastic radiation therapy: Secondary | ICD-10-CM | POA: Diagnosis not present

## 2015-07-13 ENCOUNTER — Ambulatory Visit
Admission: RE | Admit: 2015-07-13 | Discharge: 2015-07-13 | Disposition: A | Discharge status: Home / Self Care | Payer: Medicare Other | Source: Ambulatory Visit | Attending: Radiation Oncology | Admitting: Radiation Oncology

## 2015-07-13 DIAGNOSIS — Z510 Encounter for antineoplastic radiation therapy: Secondary | ICD-10-CM | POA: Diagnosis not present

## 2015-07-13 LAB — PULMONARY FUNCTION TEST
DL/VA % PRED: 58 %
DL/VA: 2.7 ml/min/mmHg/L
DLCO UNC % PRED: 45 %
DLCO UNC: 14.6 ml/min/mmHg
FEF 25-75 POST: 1.15 L/s
FEF 25-75 Pre: 1.2 L/sec
FEF2575-%Change-Post: -4 %
FEF2575-%PRED-PRE: 54 %
FEF2575-%Pred-Post: 51 %
FEV1-%CHANGE-POST: -1 %
FEV1-%Pred-Post: 72 %
FEV1-%Pred-Pre: 73 %
FEV1-POST: 2.21 L
FEV1-Pre: 2.25 L
FEV1FVC-%Change-Post: 0 %
FEV1FVC-%PRED-PRE: 82 %
FEV6-%Change-Post: -2 %
FEV6-%Pred-Post: 91 %
FEV6-%Pred-Pre: 93 %
FEV6-Post: 3.62 L
FEV6-Pre: 3.71 L
FEV6FVC-%CHANGE-POST: 0 %
FEV6FVC-%PRED-POST: 105 %
FEV6FVC-%PRED-PRE: 105 %
FVC-%CHANGE-POST: -2 %
FVC-%PRED-POST: 85 %
FVC-%Pred-Pre: 88 %
FVC-POST: 3.63 L
FVC-PRE: 3.72 L
PRE FEV1/FVC RATIO: 60 %
PRE FEV6/FVC RATIO: 100 %
Post FEV1/FVC ratio: 61 %
Post FEV6/FVC ratio: 100 %
RV % pred: 117 %
RV: 2.99 L
TLC % pred: 94 %
TLC: 6.62 L

## 2015-07-13 NOTE — Procedures (Signed)
Pulmonary function test performed on 07/07/2015 The FVC is 3.72 L, which is 80% of predicted. The FEV1 is 2.25 L which is 73% of predicted. There is no reversibility. The FEV to FVC ratio is 60. Patient's vital capacity is 85% predicted. Residual volume is 117% of predicted. Total lung capacity is 94% of predicted. DLCO was 45% of predicted. Impression Mild obstructive lung disease which would be consistent with emphysema. There is elevated. Residual volume which could be secondary to air trapping also related to emphysema. The patient's diffusing capacity is significantly reduced.

## 2015-07-14 ENCOUNTER — Other Ambulatory Visit: Payer: Self-pay | Admitting: Family Medicine

## 2015-07-14 ENCOUNTER — Telehealth: Payer: Self-pay

## 2015-07-14 ENCOUNTER — Other Ambulatory Visit: Payer: Self-pay | Admitting: *Deleted

## 2015-07-14 ENCOUNTER — Ambulatory Visit
Admission: RE | Admit: 2015-07-14 | Discharge: 2015-07-14 | Disposition: A | Discharge status: Home / Self Care | Payer: Medicare Other | Source: Ambulatory Visit | Attending: Radiation Oncology | Admitting: Radiation Oncology

## 2015-07-14 DIAGNOSIS — Z510 Encounter for antineoplastic radiation therapy: Secondary | ICD-10-CM | POA: Diagnosis not present

## 2015-07-14 DIAGNOSIS — C3432 Malignant neoplasm of lower lobe, left bronchus or lung: Secondary | ICD-10-CM

## 2015-07-14 MED ORDER — HYDROCOD POLST-CPM POLST ER 10-8 MG/5ML PO SUER: 5.0000 mL | Freq: Two times a day (BID) | ORAL | Status: DC | PRN

## 2015-07-14 NOTE — Telephone Encounter (Signed)
  Oncology Nurse Navigator Documentation    Navigator Encounter Type: Telephone (07/14/15 1500)               Received call from spouse. Pt has cough that is keeping him awake at night. Cough is not new and was present before XRT started. NP Magda Paganini notified and will write a script for cough medication. Wife notified that it will be available at the front desk for her to pick up. Readback performed.

## 2015-07-17 ENCOUNTER — Ambulatory Visit: Payer: Medicare Other

## 2015-07-17 ENCOUNTER — Ambulatory Visit
Admission: RE | Admit: 2015-07-17 | Discharge: 2015-07-17 | Disposition: A | Discharge status: Home / Self Care | Payer: Medicare Other | Source: Ambulatory Visit | Attending: Radiation Oncology | Admitting: Radiation Oncology

## 2015-07-17 DIAGNOSIS — Z510 Encounter for antineoplastic radiation therapy: Secondary | ICD-10-CM | POA: Diagnosis not present

## 2015-07-18 ENCOUNTER — Ambulatory Visit
Admission: RE | Admit: 2015-07-18 | Discharge: 2015-07-18 | Disposition: A | Discharge status: Home / Self Care | Payer: Medicare Other | Source: Ambulatory Visit | Attending: Radiation Oncology | Admitting: Radiation Oncology

## 2015-07-18 ENCOUNTER — Ambulatory Visit: Payer: Medicare Other

## 2015-07-18 DIAGNOSIS — Z510 Encounter for antineoplastic radiation therapy: Secondary | ICD-10-CM | POA: Diagnosis not present

## 2015-07-19 ENCOUNTER — Ambulatory Visit
Admission: RE | Admit: 2015-07-19 | Discharge: 2015-07-19 | Disposition: A | Discharge status: Home / Self Care | Payer: Medicare Other | Source: Ambulatory Visit | Attending: Radiation Oncology | Admitting: Radiation Oncology

## 2015-07-19 ENCOUNTER — Other Ambulatory Visit: Payer: Self-pay | Admitting: *Deleted

## 2015-07-19 ENCOUNTER — Ambulatory Visit: Payer: Medicare Other

## 2015-07-19 DIAGNOSIS — C3492 Malignant neoplasm of unspecified part of left bronchus or lung: Secondary | ICD-10-CM

## 2015-07-19 DIAGNOSIS — Z510 Encounter for antineoplastic radiation therapy: Secondary | ICD-10-CM | POA: Diagnosis not present

## 2015-07-20 ENCOUNTER — Ambulatory Visit
Admission: RE | Admit: 2015-07-20 | Discharge: 2015-07-20 | Disposition: A | Discharge status: Home / Self Care | Payer: Medicare Other | Source: Ambulatory Visit | Attending: Radiation Oncology | Admitting: Radiation Oncology

## 2015-07-20 ENCOUNTER — Inpatient Hospital Stay: Payer: Medicare Other

## 2015-07-20 ENCOUNTER — Ambulatory Visit: Payer: Medicare Other

## 2015-07-20 DIAGNOSIS — C3432 Malignant neoplasm of lower lobe, left bronchus or lung: Secondary | ICD-10-CM | POA: Diagnosis not present

## 2015-07-20 DIAGNOSIS — Z510 Encounter for antineoplastic radiation therapy: Secondary | ICD-10-CM | POA: Diagnosis not present

## 2015-07-20 LAB — CBC
HCT: 40.9 % (ref 40.0–52.0)
HEMOGLOBIN: 13.6 g/dL (ref 13.0–18.0)
MCH: 27.5 pg (ref 26.0–34.0)
MCHC: 33.2 g/dL (ref 32.0–36.0)
MCV: 82.9 fL (ref 80.0–100.0)
Platelets: 260 10*3/uL (ref 150–440)
RBC: 4.94 MIL/uL (ref 4.40–5.90)
RDW: 15.6 % — ABNORMAL HIGH (ref 11.5–14.5)
WBC: 5.7 10*3/uL (ref 3.8–10.6)

## 2015-07-20 LAB — CULTURE, FUNGUS WITHOUT SMEAR: Culture: NO GROWTH

## 2015-07-21 ENCOUNTER — Ambulatory Visit
Admission: RE | Admit: 2015-07-21 | Discharge: 2015-07-21 | Disposition: A | Discharge status: Home / Self Care | Payer: Medicare Other | Source: Ambulatory Visit | Attending: Radiation Oncology | Admitting: Radiation Oncology

## 2015-07-21 ENCOUNTER — Ambulatory Visit: Payer: Medicare Other

## 2015-07-21 DIAGNOSIS — Z510 Encounter for antineoplastic radiation therapy: Secondary | ICD-10-CM | POA: Diagnosis not present

## 2015-07-22 LAB — CULTURE, FUNGUS WITHOUT SMEAR

## 2015-07-24 ENCOUNTER — Ambulatory Visit: Payer: Medicare Other

## 2015-07-24 ENCOUNTER — Ambulatory Visit
Admission: RE | Admit: 2015-07-24 | Discharge: 2015-07-24 | Disposition: A | Discharge status: Home / Self Care | Payer: Medicare Other | Source: Ambulatory Visit | Attending: Radiation Oncology | Admitting: Radiation Oncology

## 2015-07-24 ENCOUNTER — Encounter: Payer: Self-pay | Admitting: Oncology

## 2015-07-24 DIAGNOSIS — Z510 Encounter for antineoplastic radiation therapy: Secondary | ICD-10-CM | POA: Diagnosis not present

## 2015-07-25 ENCOUNTER — Ambulatory Visit: Payer: Medicare Other

## 2015-07-25 ENCOUNTER — Ambulatory Visit
Admission: RE | Admit: 2015-07-25 | Discharge: 2015-07-25 | Disposition: A | Discharge status: Home / Self Care | Payer: Medicare Other | Source: Ambulatory Visit | Attending: Radiation Oncology | Admitting: Radiation Oncology

## 2015-07-25 DIAGNOSIS — Z510 Encounter for antineoplastic radiation therapy: Secondary | ICD-10-CM | POA: Diagnosis not present

## 2015-07-26 ENCOUNTER — Ambulatory Visit: Payer: Medicare Other

## 2015-07-26 ENCOUNTER — Ambulatory Visit
Admission: RE | Admit: 2015-07-26 | Discharge: 2015-07-26 | Disposition: A | Discharge status: Home / Self Care | Payer: Medicare Other | Source: Ambulatory Visit | Attending: Radiation Oncology | Admitting: Radiation Oncology

## 2015-07-26 DIAGNOSIS — Z510 Encounter for antineoplastic radiation therapy: Secondary | ICD-10-CM | POA: Diagnosis not present

## 2015-07-27 ENCOUNTER — Inpatient Hospital Stay: Payer: Medicare Other

## 2015-07-27 ENCOUNTER — Ambulatory Visit: Payer: Medicare Other

## 2015-07-27 ENCOUNTER — Ambulatory Visit
Admission: RE | Admit: 2015-07-27 | Discharge: 2015-07-27 | Disposition: A | Discharge status: Home / Self Care | Payer: Medicare Other | Source: Ambulatory Visit | Attending: Radiation Oncology | Admitting: Radiation Oncology

## 2015-07-27 ENCOUNTER — Encounter: Payer: Self-pay | Admitting: Oncology

## 2015-07-27 ENCOUNTER — Inpatient Hospital Stay (HOSPITAL_BASED_OUTPATIENT_CLINIC_OR_DEPARTMENT_OTHER): Payer: Medicare Other | Admitting: Oncology

## 2015-07-27 VITALS — BP 108/69 | HR 86 | Temp 97.4°F | Resp 18 | Wt 181.0 lb

## 2015-07-27 DIAGNOSIS — M25552 Pain in left hip: Secondary | ICD-10-CM | POA: Diagnosis not present

## 2015-07-27 DIAGNOSIS — I10 Essential (primary) hypertension: Secondary | ICD-10-CM

## 2015-07-27 DIAGNOSIS — R5383 Other fatigue: Secondary | ICD-10-CM | POA: Diagnosis not present

## 2015-07-27 DIAGNOSIS — C3492 Malignant neoplasm of unspecified part of left bronchus or lung: Secondary | ICD-10-CM

## 2015-07-27 DIAGNOSIS — Z8673 Personal history of transient ischemic attack (TIA), and cerebral infarction without residual deficits: Secondary | ICD-10-CM

## 2015-07-27 DIAGNOSIS — C7951 Secondary malignant neoplasm of bone: Secondary | ICD-10-CM

## 2015-07-27 DIAGNOSIS — Z510 Encounter for antineoplastic radiation therapy: Secondary | ICD-10-CM | POA: Diagnosis not present

## 2015-07-27 DIAGNOSIS — C3432 Malignant neoplasm of lower lobe, left bronchus or lung: Secondary | ICD-10-CM

## 2015-07-27 DIAGNOSIS — Z79899 Other long term (current) drug therapy: Secondary | ICD-10-CM

## 2015-07-27 DIAGNOSIS — Z7901 Long term (current) use of anticoagulants: Secondary | ICD-10-CM

## 2015-07-27 DIAGNOSIS — I4891 Unspecified atrial fibrillation: Secondary | ICD-10-CM

## 2015-07-27 DIAGNOSIS — Z7952 Long term (current) use of systemic steroids: Secondary | ICD-10-CM

## 2015-07-27 DIAGNOSIS — R531 Weakness: Secondary | ICD-10-CM

## 2015-07-27 DIAGNOSIS — J449 Chronic obstructive pulmonary disease, unspecified: Secondary | ICD-10-CM

## 2015-07-27 LAB — CBC WITH DIFFERENTIAL/PLATELET
BASOS ABS: 0 10*3/uL (ref 0–0.1)
Basophils Relative: 1 %
EOS ABS: 0 10*3/uL (ref 0–0.7)
EOS PCT: 0 %
HCT: 43.4 % (ref 40.0–52.0)
Hemoglobin: 14.3 g/dL (ref 13.0–18.0)
LYMPHS PCT: 6 %
Lymphs Abs: 0.4 10*3/uL — ABNORMAL LOW (ref 1.0–3.6)
MCH: 27.1 pg (ref 26.0–34.0)
MCHC: 32.9 g/dL (ref 32.0–36.0)
MCV: 82.4 fL (ref 80.0–100.0)
Monocytes Absolute: 0.5 10*3/uL (ref 0.2–1.0)
Monocytes Relative: 7 %
Neutro Abs: 6.3 10*3/uL (ref 1.4–6.5)
Neutrophils Relative %: 86 %
PLATELETS: 282 10*3/uL (ref 150–440)
RBC: 5.27 MIL/uL (ref 4.40–5.90)
RDW: 15.5 % — ABNORMAL HIGH (ref 11.5–14.5)
WBC: 7.3 10*3/uL (ref 3.8–10.6)

## 2015-07-27 LAB — COMPREHENSIVE METABOLIC PANEL
ALT: 57 U/L (ref 17–63)
AST: 34 U/L (ref 15–41)
Albumin: 3.1 g/dL — ABNORMAL LOW (ref 3.5–5.0)
Alkaline Phosphatase: 134 U/L — ABNORMAL HIGH (ref 38–126)
Anion gap: 10 (ref 5–15)
BUN: 17 mg/dL (ref 6–20)
CHLORIDE: 97 mmol/L — AB (ref 101–111)
CO2: 25 mmol/L (ref 22–32)
CREATININE: 1.12 mg/dL (ref 0.61–1.24)
Calcium: 7.5 mg/dL — ABNORMAL LOW (ref 8.9–10.3)
GFR calc Af Amer: >60 mL/min (ref >60)
GFR calc non Af Amer: >60 mL/min (ref >60)
Glucose, Bld: 201 mg/dL — ABNORMAL HIGH (ref 65–99)
Potassium: 3.9 mmol/L (ref 3.5–5.1)
SODIUM: 132 mmol/L — AB (ref 135–145)
Total Bilirubin: 1 mg/dL (ref 0.3–1.2)
Total Protein: 7.2 g/dL (ref 6.5–8.1)

## 2015-07-27 MED ORDER — PREDNISONE 20 MG PO TABS: 20.0000 mg | ORAL_TABLET | Freq: Every day | ORAL | Status: DC

## 2015-07-27 NOTE — Progress Notes (Signed)
Patient does have living will.  Former smoker.

## 2015-07-27 NOTE — Progress Notes (Signed)
Reader @ [facility] [phone]) [phone]  Fax:(336) [place] OB: 1941/03/11  MR#: [id number]  OEV#:[id number]  Patient Care Team: Lavera Guise, MD as PCP - General (Internal Medicine)  CHIEF COMPLAINT:  Chief Complaint  Patient presents with  . Follow-up    VISIT DIAGNOSIS:     ICD-9-CM ICD-10-CM   1. Carcinoma of left lung 162.9 C34.92 predniSONE (DELTASONE) 20 MG tablet     Oncology History   1.  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  Patient has atrial fibrillation on a long-term xeralto 2.  History of cerebrovascular accident when he was taken off of xeralto 3.  Hypercholesterolemia 4.  Hypertension INTERVAL HISTORY:  74 year old gentleman started having cough and hemoptysis a week ago was admitted in the hospital.  Patient had a CT scan which revealed left lower lobe mass.  Patient underwent bronchoscopy.  Patient was started again on 0 all toe bronchoscopy revealed left lower lobe (and lingula) mass positive for malignancy.  Non-small cell type.  Some of the biopsy of subcarinal lymph nodes were negative for malignancy.  0 all toe patient does not have any more hemoptysis at present time.  No chest pain.  Patient had a pain in the left hip  2 weeks duration Patient was referred to me for further evaluation and management regarding carcinoma of lung In July, 2016 Patient is here for further evaluation and treatment consideration.  Continues to have left hip pain.  No nausea.  No vomiting.  That scan shows multiple metastases in bilateral lymph node as well as a bone. No hemoptysis at  present time.  Patient is starting radiation therapy on Monday July 27, 2015 Patient is here for further follow-up regarding stage IV carcinoma of lung.  Patient had negative EGFR.  \ ALK NOT mutated. Ross-[id number] was not identified Assessment as finished radiation therapy Feeling weak and fatigued Received XGEVA on August 1  REVIEW OF SYSTEMS:   Gen. status: Extremely apprehensive individual.  Not in any acute distress.    general status: Patient is feeling weak and tired.  No change in a performance status.  No chills.  No fever. HEENT:   No evidence of stomatitis Lungs as described above in history of present illness history of cough, hemoptysis patient is former smoker has COPD using inhalers Cardiac: No chest pain or paroxysmal noctur As finished nal dyspnea.  History of atrial fibrillation GI: No nausea no vomiting no diarrhea no abdominal pain Skin: No rash Lower extremity no swelling Neurological system: No tingling.  No numbness.  No other focal signs.  History of cerebrovascular accident in April with residual weakness Musculoskeletal system no bony pains GU: Patient had a high PSA but biopsy was negative  As per HPI. Otherwise, a complete review of systems is negatve.  PAST MEDICAL HISTORY: Past Medical History  Diagnosis Date  . Hypertension   . Pneumonia   . Cancer     lung mass  . Stroke   . COPD (chronic obstructive pulmonary disease)   . Carcinoma of  left lung 07/05/2015  . Anorexia 03/2016  . Stroke 03/2016    PAST SURGICAL HISTORY:  had a cataract surgery in the past.  Has a large prostate biopsy was done.   FAMILY HISTORY There is no significant family history of breast cancer, ovarian cancer, colon cancer  grandmother had stomach cancer family history of heart disease     ADVANCED DIRECTIVES:  Patient does have advance healthcare directive, Patient   does not desire to make any changes  HEALTH MAINTENANCE: Social History  Substance Use Topics    . Smoking status: Former Smoker    Quit date: 06/27/2005  . Smokeless tobacco: None  . Alcohol Use: None     Allergies  Allergen Reactions  . Celebrex [Celecoxib]   . Zolpidem Tartrate Anxiety    Current Outpatient Prescriptions  Medication Sig Dispense Refill  . albuterol (PROVENTIL) (2.5 MG/3ML) 0.083% nebulizer solution Take 3 mLs (2.5 mg total) by nebulization every 4 (four) hours as needed for wheezing or shortness of breath. 75 mL 12  . atorvastatin (LIPITOR) 20 MG tablet Take 20 mg by mouth daily.    . chlorpheniramine-HYDROcodone (TUSSIONEX) 10-8 MG/5ML SUER Take 5 mLs by mouth every 12 (twelve) hours as needed for cough. 140 mL 0  . Fluticasone-Salmeterol (ADVAIR DISKUS) 250-50 MCG/DOSE AEPB Inhale 1 puff into the lungs 2 (two) times daily.    . hydrochlorothiazide (HYDRODIURIL) 12.5 MG tablet Take 12.5 mg by mouth daily.    Marland Kitchen LORazepam (ATIVAN) 0.5 MG tablet Take 0.5 mg by mouth every 6 (six) hours as needed for anxiety.    Marland Kitchen losartan (COZAAR) 100 MG tablet Take 100 mg by mouth daily.    . mirtazapine (REMERON) 15 MG tablet Take 1 tablet (15 mg total) by mouth at bedtime. 30 tablet 3  . rivaroxaban (XARELTO) 20 MG TABS tablet Take 20 mg by mouth daily with supper.    . VENTOLIN HFA 108 (90 BASE) MCG/ACT inhaler Inhale 1 puff into the lungs every 4 (four) hours as needed for wheezing or shortness of breath.   0  . azithromycin (ZITHROMAX) 250 MG tablet   0  . cefUROXime (CEFTIN) 250 MG tablet   0  . predniSONE (DELTASONE) 20 MG tablet Take 1 tablet (20 mg total) by mouth daily with breakfast. 30 tablet 0   No current facility-administered medications for this visit.    OBJECTIVE: PHYSICAL EXAM:GENERAL:  Well developed, well nourished, sitting comfortably in the exam room in no acute distress.  Patient is walking with the help of cane  MENTAL STATUS:  Alert and oriented to person, place and time. HEAD.  Normocephalic, atraumatic, face symmetric, no Cushingoid  features. EYES:    Pupils equal round and reactive to light and accomodation.  No conjunctivitis or scleral icterus. ENT:  Oropharynx clear without lesion.  Tongue normal. Mucous membranes moist.  RESPIRATORY:  Diminished and entry on both sides.  Emphysematous chest.  His dullness on percussion on the left lower chest CARDIOVASCULAR:  Regular rate and rhythm without murmur, rub or gallop. Abdominal exam revealed normal bowel sounds. The abdomen was soft, non-tender, and without masses, organomegaly, or appreciable enlargement of the abdominal aorta. BACK:  No CVA tenderness.  No tenderness on percussion of the back or rib cage.  Intermittent tenderness in the left hip SKIN:  No rashes, ulcers or lesions. EXTREMITIES: No edema, no skin discoloration or tenderness.  No palpable cords. LYMPH NODES: No palpable cervical, supraclavicular, axillary or inguinal adenopathy  NEUROLOGICAL: She has residual  weakness on the right side walking with the help of cane PSYCH:  Appropriate.very apprehensive.    Filed Vitals:   07/27/15 1158  BP: 108/69  Pulse: 86  Temp: 97.4 F (36.3 C)  Resp: 18     Body mass index is 25.97 kg/(m^2).    ECOG FS:1 - Symptomatic but completely ambulatory  LAB RESULTS:  Appointment on 07/27/2015  Component Date Value Ref Range Status  . WBC 07/27/2015 7.3  3.8 - 10.6 K/uL Final  . RBC 07/27/2015 5.27  4.40 - 5.90 MIL/uL Final  . Hemoglobin 07/27/2015 14.3  13.0 - 18.0 g/dL Final  . HCT 07/27/2015 43.4  40.0 - 52.0 % Final  . MCV 07/27/2015 82.4  80.0 - 100.0 fL Final  . MCH 07/27/2015 27.1  26.0 - 34.0 pg Final  . MCHC 07/27/2015 32.9  32.0 - 36.0 g/dL Final  . RDW 07/27/2015 15.5* 11.5 - 14.5 % Final  . Platelets 07/27/2015 282  150 - 440 K/uL Final  . Neutrophils Relative % 07/27/2015 86   Final  . Neutro Abs 07/27/2015 6.3  1.4 - 6.5 K/uL Final  . Lymphocytes Relative 07/27/2015 6   Final  . Lymphs Abs 07/27/2015 0.4* 1.0 - 3.6 K/uL Final  . Monocytes Relative  07/27/2015 7   Final  . Monocytes Absolute 07/27/2015 0.5  0.2 - 1.0 K/uL Final  . Eosinophils Relative 07/27/2015 0   Final  . Eosinophils Absolute 07/27/2015 0.0  0 - 0.7 K/uL Final  . Basophils Relative 07/27/2015 1   Final  . Basophils Absolute 07/27/2015 0.0  0 - 0.1 K/uL Final  . Sodium 07/27/2015 132* 135 - 145 mmol/L Final  . Potassium 07/27/2015 3.9  3.5 - 5.1 mmol/L Final  . Chloride 07/27/2015 97* 101 - 111 mmol/L Final  . CO2 07/27/2015 25  22 - 32 mmol/L Final  . Glucose, Bld 07/27/2015 201* 65 - 99 mg/dL Final  . BUN 07/27/2015 17  6 - 20 mg/dL Final  . Creatinine, Ser 07/27/2015 1.12  0.61 - 1.24 mg/dL Final  . Calcium 07/27/2015 7.5* 8.9 - 10.3 mg/dL Final  . Total Protein 07/27/2015 7.2  6.5 - 8.1 g/dL Final  . Albumin 07/27/2015 3.1* 3.5 - 5.0 g/dL Final  . AST 07/27/2015 34  15 - 41 U/L Final  . ALT 07/27/2015 57  17 - 63 U/L Final  . Alkaline Phosphatase 07/27/2015 134* 38 - 126 U/L Final  . Total Bilirubin 07/27/2015 1.0  0.3 - 1.2 mg/dL Final  . GFR calc non Af Amer 07/27/2015 >60  >60 mL/min Final  . GFR calc Af Amer 07/27/2015 >60  >60 mL/min Final   Comment: (NOTE) The eGFR has been calculated using the CKD EPI equation. This calculation has not been validated in all clinical situations. eGFR's persistently <60 mL/min signify possible Chronic Kidney Disease.   . Anion gap 07/27/2015 10  5 - 15 Final     STUDIES: Dg Chest 1 View  06/30/2015   CLINICAL DATA:  Pneumonia, emphysema and pulmonary fibrosis, left-sided lung mass  EXAM: CHEST  1 VIEW  COMPARISON:  CT scan of the chest of June 28, 2015 and chest x-ray of the same day  FINDINGS: There is a persistent left perihilar masslike density. The lungs are well-expanded. The interstitial markings remain increased bilaterally. The heart is normal in size. The pulmonary vascularity is not engorged. The trachea is midline. The bony thorax exhibits no acute abnormality.  IMPRESSION: Stable appearance of the chest  since yesterday's study with persistent left hilar masslike density with known parenchymal abnormality in the left upper and lower lobes. COPD and pulmonary fibrosis.   Electronically Signed   By: David  Martinique M.D.   On: 06/30/2015 07:38   Ct Chest W Contrast  06/28/2015   CLINICAL DATA:  Hemoptysis beginning yesterday, history atrial fibrillation on Xarelto, COPD, pneumonia, hypertension, stroke  EXAM: CT CHEST WITH CONTRAST  TECHNIQUE: Multidetector CT imaging of the chest was performed during intravenous contrast administration. Sagittal and coronal MPR images reconstructed from axial data set.  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  Scattered atherosclerotic calcifications aorta and coronary arteries.  Pulmonary arteries grossly patent on nondedicated exam.  Enlarged RIGHT hilar lymph node 15 mm short axis image 30, increased.  Enlarged RIGHT peritracheal node 12 mm short axis image 24 previously 14 mm.  Additional scattered normal sized mediastinal and hilar nodes.  Thyroid gland in visualized inferior cervical region unremarkable.  Visualized upper abdomen normal appearance.  Minimal pericardial fluid.  Small LEFT pleural effusion increased since previous exam.  Persisting consolidation involving posterior LEFT upper lobe and superior segment LEFT lower lobe, extending to hilum.  Associated narrowing of LEFT lower lobe bronchi and scattered peribronchial thickening.  Malignancy not excluded.  Underlying emphysematous changes with scattered peripheral subpleural fibrosis in both lungs.  Increased nodule at medial RIGHT upper lobe 8 mm diameter image 9 previously 5 mm.  New RIGHT lower lobe nodular density 8 mm diameter image 44.  Stable tiny nodular density RIGHT lower lobe 3 mm diameter image 45.  New small LEFT lung nodules images 23, 24, 26, and 33.  Slightly increased size  of tiny LEFT upper lobe nodule image 27.  No pneumothorax or acute osseous findings.  Small lipoma RIGHT latissimus dorsi.  IMPRESSION: Increased opacification of the posterior mid LEFT lung involving posterior LEFT upper lobe and superior segment of the LEFT lower lobe extending to pulmonary hilum; the presence of progressive/non resolving pulmonary infiltrate is concerning for pulmonary neoplasm and bronchoscopy with tissue diagnosis is recommended.  Underlying emphysematous changes with increased peribronchial thickening in particularly LEFT lung since previous exam.  Multiple tiny pulmonary nodules, increasing or new since previous exam cannot exclude metastatic disease.  RIGHT paratracheal and increased RIGHT hilar adenopathy.   Electronically Signed   By: MLavonia DanaM.D.   On: 06/28/2015 18:13   Nm Pet Image Initial (pi) Skull Base To Thigh  07/06/2015   CLINICAL DATA:  Initial treatment strategy for lung cancer. Staging examination.  EXAM: NUCLEAR MEDICINE PET SKULL BASE TO THIGH  TECHNIQUE: 12.25 mCi F-18 FDG was injected intravenously. Full-ring PET imaging was performed from the skull base to thigh after the radiotracer. CT data was obtained and used for attenuation correction and anatomic localization.  FASTING BLOOD GLUCOSE:  Value: 108 mg/dl  COMPARISON:  Chest CT 06/28/2015.  FINDINGS: NECK  Left-sided submandibular lymph node measuring only 8 mm in short axis (image 37 of series 3) is hypermetabolic (SUVmax = 4.4). No other hypermetabolic lymph nodes in the neck.  CHEST  Large infiltrative hypermetabolic (SUVmax = 109.6)EAVWin the left lower lobe, with epicenter in the superior segment of the left lower lobe. This mass has progressively enlarged compared to prior examinations, and is now clearly crossing the left major fissure into the posterior aspect of the left upper lobe. This mass is  very infiltrative in appearance, and surrounded by some adjacent postobstructive changes, and therefore  difficult to discretely measure, however, this measures at least 7.2 x 6.4 x 12.9 cm. Several prominent but nonenlarged left supraclavicular lymph nodes are noted measuring up to 7 mm in short axis, and are suspicious, but not clearly hypermetabolic at this time (SUVmax = 2.2). Small left-sided pleural effusion (likely malignant) which appears loculated in the medial aspect of the left hemithorax. 7 mm nodule in the medial aspect of the apex of the right upper lobe (image 76 of series 3) demonstrates no internal metabolic activity. Numerous enlarged hypermetabolic mediastinal and left hilar lymph nodes are noted measuring up to 12 mm in short axis in the right paratracheal (SUVmax = 5.4) nodal station (image 97 of series 3), compatible with widespread nodal metastasis.  ABDOMEN/PELVIS  No abnormal hypermetabolic activity within the liver, pancreas, adrenal glands, or spleen. No hypermetabolic lymph nodes in the abdomen or pelvis. Liver has a slightly shrunken appearance and nodular contour, suggestive of cirrhosis. 1.9 cm low-attenuation lesion in the medial aspect of the interpolar region of the right kidney is similar to prior studies, previously characterized as a cyst. Atherosclerosis throughout the abdominal and pelvic vasculature, without evidence of aneurysm. No significant volume of ascites. No pneumoperitoneum. Prostate gland is enlarged measuring 4.7 x 5.7 cm, and there is a focus of hypermetabolism in the posterior aspect of the peripheral zone on the right side (SUVmax = 4.7).  SKELETON  Hypermetabolic (SUVmax = 5.9) lytic lesion with overlying soft tissue component measuring 2.9 x 2.2 cm in the left ilium (image 210 of series 3) compatible with an osseous metastasis.  IMPRESSION: 1. Findings, as above, compatible with stage IV (T4, N3, M1b) lung cancer. The primary left lower lobe lesion has now invaded into the posterior aspect of the left upper lobe, and there is a small left-sided pleural effusion  which appears partially loculated (likely malignant), with left hilar, bilateral mediastinal, none probable left supraclavicular lymphadenopathy, as well as a metastatic lesion to the left ilium. There is also a nonenlarged but mildly hypermetabolic left-sided submandibular lymph node which is nonspecific. Metastatic disease in this distribution would be unusual. 2. Small focus of hypermetabolism in the posterior aspect of the peripheral zone of the prostate gland on the right side suspicious for potential prostate neoplasm. 3. Atherosclerosis, including left main and 3 vessel coronary artery disease. 4. Additional incidental findings, as above.   Electronically Signed   By: Vinnie Langton M.D.   On: 07/06/2015 12:05   Dg Chest Portable 1 View  06/28/2015   CLINICAL DATA:  Acute onset of hemoptysis. Atrial fibrillation. Initial encounter.  EXAM: PORTABLE CHEST - 1 VIEW  COMPARISON:  Chest radiograph performed 04/21/2015, and CT of the chest performed 04/25/2015  FINDINGS: There is increasing left perihilar airspace opacification, which may reflect a chronic atypical infection, given hemoptysis and gradual worsening from the prior study. Mild patchy right basilar airspace opacity is noted. No definite pleural effusion or pneumothorax is seen.  The cardiomediastinal silhouette is borderline normal in size. No acute osseous abnormalities are identified.  IMPRESSION: Increasing left perihilar airspace opacification, which may reflect a chronic atypical infection or possibly an unusual malignancy, given hemoptysis and gradual worsening from the prior study. Mild patchy right basilar airspace opacity seen. Pulmonary consultation was recommended per the prior CT, for possible bronchoscopy and tissue sampling.   Electronically Signed   By: Garald Balding M.D.   On: 06/28/2015 06:50  ASSESSMENT:  Non-small cell carcinoma of lung left lower lobe.  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  Medical  decision-making We discussed possibility of chemotherapy with [place] Taxol versus carbo and Alimta. The patient Can be a candidate for BMS 370 trial Been discussed with the research nurse Patient is on xeralto for atrial fibrillation and family is quite concerned about stopping xeralto for port placement. I discussed situation with Dr. Faith Rogue and will proceed with stopping xeralto for 2 days and bridging that with Lovenox and get port placement done  Total duration of visit was 45 minutes.  50% or more time was spent in counseling patient and family regarding prognosis and options of treatment and available resources   PET scan has been reviewed independently Case was discussed today in case conference   Carcinoma of left lung   Staging form: Lung, AJCC 7th Edition     Clinical: Stage 4 (T4, N2.  m1) - Signed by Forest Gleason, MD on 07/05/2015  Forest Gleason, MD   07/27/2015 8:09 PM

## 2015-07-28 ENCOUNTER — Other Ambulatory Visit: Payer: Self-pay | Admitting: *Deleted

## 2015-07-28 ENCOUNTER — Ambulatory Visit: Payer: Medicare Other

## 2015-07-28 ENCOUNTER — Inpatient Hospital Stay (HOSPITAL_BASED_OUTPATIENT_CLINIC_OR_DEPARTMENT_OTHER): Payer: Medicare Other | Admitting: Cardiothoracic Surgery

## 2015-07-28 ENCOUNTER — Telehealth: Payer: Self-pay | Admitting: Cardiothoracic Surgery

## 2015-07-28 VITALS — BP 125/71 | HR 70 | Temp 96.7°F | Resp 18 | Wt 180.8 lb

## 2015-07-28 DIAGNOSIS — C3432 Malignant neoplasm of lower lobe, left bronchus or lung: Secondary | ICD-10-CM | POA: Diagnosis not present

## 2015-07-28 DIAGNOSIS — C3492 Malignant neoplasm of unspecified part of left bronchus or lung: Secondary | ICD-10-CM

## 2015-07-28 DIAGNOSIS — I639 Cerebral infarction, unspecified: Secondary | ICD-10-CM

## 2015-07-28 DIAGNOSIS — C3490 Malignant neoplasm of unspecified part of unspecified bronchus or lung: Secondary | ICD-10-CM

## 2015-07-28 MED ORDER — ENOXAPARIN SODIUM 80 MG/0.8ML ~~LOC~~ SOLN: 80.0000 mg | SUBCUTANEOUS | Status: DC

## 2015-07-28 MED ORDER — ENOXAPARIN SODIUM 120 MG/0.8ML ~~LOC~~ SOLN: 120.0000 mg | SUBCUTANEOUS | Status: DC

## 2015-07-28 MED ORDER — FOLIC ACID 1 MG PO TABS: 1.0000 mg | ORAL_TABLET | Freq: Every day | ORAL | Status: DC

## 2015-07-28 NOTE — Progress Notes (Addendum)
Pt instructed and educated on how to self-administer lovenox injections. Pt and pt's spouse verbalized understanding and declined to return demonstration. Informed pt and wife that an RN will be at the cancer center tomorrow morning at 10am if needs further assistance with lovenox injections. Pt and wife verbalized understanding. Informed pt that Burgess Estelle, RN will be calling with further information regarding PAC placement on Tuesday at 12pm and to go over further instructions prior to procedure.

## 2015-07-28 NOTE — Telephone Encounter (Signed)
Pt advised of pre op date/time and sx date. Sx: 08/01/15 with Dr Rolley Sims Placement Pre op: 08/01/15 @ 10:00am--office.  Pt's wife was advised to have pt arrive at pre admit testing in the medical arts building-second floor--at 10:00am the day of surgery. She was also advised that the patient should not have nothing to eat or drink after midnight prior to sx. All medications should be held until after sx.   Pt stated that she had went to obtain Rx at [facility] on [address], however it had not been called in. I spoke with Hildred Alamin, Rn and she has faxed the Rx again. Pt was advised to call [facility] in an hour to make sure the Rx was there before leaving to pick up.   I have checked insurance--no authorization is required.

## 2015-07-28 NOTE — Progress Notes (Signed)
[facility] Post-Op Note  Patient ID: Curtis Freeman, male   DOB: October 24, 1941, 74 y.o.   MRN: [id number]  HISTORY: This patient is known to me. He has a history of metastatic lung cancer. I discussed his care with Dr. Ramond Craver yesterday and it was felt that the patient would require a Port-A-Cath insertion for ongoing continued care. 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 However the patient is still concerned about this issue like to discuss the pros and cons of Port-A-Cath insertion with his anticoagulation status. The patient states that he has had some cough but no hemoptysis. He's had no fevers or chills. He is completed 11 radiation therapy sessions and is not scheduled to have any further.   Filed Vitals:   07/28/15 0932  BP: 125/71  Pulse: 70  Temp: 96.7 F (35.9 C)  Resp: 18     EXAM: Resp: Lungs are clear bilaterally.  No respiratory distress, normal effort. Heart:  Regular without murmurs Abd:  Abdomen is soft, non distended and non tender. No masses are palpable.  There is no rebound and no guarding.  Neurological: Alert and oriented to person, place, and time. Coordination normal.  Skin: Skin is warm and dry. No rash noted. No diaphoretic. No erythema. No pallor.  Psychiatric: Normal mood and affect. Normal behavior. Judgment and thought content normal.    ASSESSMENT: I have reviewed with the patient the indications and risks of Port-A-Cath placement. Risks of bleeding, infection and pneumothorax were all discussed. I also discussed his care with our pharmacist and we have recommended that he be bridged with Lovenox 80 mg subcutaneously per day. He will stop his Lovenox on Monday and he will have his surgery on Tuesday. I explained this all to the patient and his wife and they are in agreement to proceed.   PLAN:   We will obtain a  consent today for Port-A-Cath insertion. His laboratory studies are essentially acceptable for surgery. A prescription was written for Lovenox and he was told to stop his Xarelto today which she has already taken today's dose.    Nestor Lewandowsky, MD

## 2015-07-28 NOTE — Addendum Note (Signed)
Addended by: Telford Nab on: 07/28/2015 02:45 PM   Modules accepted: Orders, Medications

## 2015-07-29 ENCOUNTER — Telehealth: Payer: Self-pay | Admitting: *Deleted

## 2015-07-29 NOTE — Telephone Encounter (Signed)
Called patient to see if was able to get lovenox injections from pharmacy or if needed any assistance with self-administration. Pt not at home, message left on voicemail. Reminded patient to give last injection on Monday before 12pm and that last injections needs to be given 24 hours prior to procedure on Tuesday 12pm. Informed pt that will be at cancer center tomorrow at 10am if needs further assistance. Pt instructed to call on Monday if has any further needs.

## 2015-07-31 ENCOUNTER — Ambulatory Visit: Payer: Medicare Other

## 2015-07-31 NOTE — Telephone Encounter (Signed)
Pt has been informed that medication has been called into [facility] on [address].  Hailey, RN will contact pt to see if they have any questions about injections.

## 2015-08-01 ENCOUNTER — Ambulatory Visit: Payer: Medicare Other | Admitting: Anesthesiology

## 2015-08-01 ENCOUNTER — Encounter
Admission: RE | Disposition: A | Discharge status: Home / Self Care | Payer: Self-pay | Source: Ambulatory Visit | Attending: Cardiothoracic Surgery

## 2015-08-01 ENCOUNTER — Encounter: Payer: Self-pay | Admitting: *Deleted

## 2015-08-01 ENCOUNTER — Ambulatory Visit
Admission: RE | Admit: 2015-08-01 | Discharge: 2015-08-01 | Disposition: A | Discharge status: Home / Self Care | Payer: Medicare Other | Source: Ambulatory Visit | Attending: Cardiothoracic Surgery | Admitting: Cardiothoracic Surgery

## 2015-08-01 ENCOUNTER — Ambulatory Visit: Payer: Medicare Other

## 2015-08-01 DIAGNOSIS — I10 Essential (primary) hypertension: Secondary | ICD-10-CM | POA: Insufficient documentation

## 2015-08-01 DIAGNOSIS — G4730 Sleep apnea, unspecified: Secondary | ICD-10-CM | POA: Insufficient documentation

## 2015-08-01 DIAGNOSIS — Z8673 Personal history of transient ischemic attack (TIA), and cerebral infarction without residual deficits: Secondary | ICD-10-CM | POA: Diagnosis not present

## 2015-08-01 DIAGNOSIS — C3492 Malignant neoplasm of unspecified part of left bronchus or lung: Secondary | ICD-10-CM | POA: Diagnosis present

## 2015-08-01 DIAGNOSIS — Z87891 Personal history of nicotine dependence: Secondary | ICD-10-CM | POA: Insufficient documentation

## 2015-08-01 DIAGNOSIS — J449 Chronic obstructive pulmonary disease, unspecified: Secondary | ICD-10-CM | POA: Insufficient documentation

## 2015-08-01 DIAGNOSIS — Z79899 Other long term (current) drug therapy: Secondary | ICD-10-CM | POA: Insufficient documentation

## 2015-08-01 DIAGNOSIS — Z7952 Long term (current) use of systemic steroids: Secondary | ICD-10-CM | POA: Insufficient documentation

## 2015-08-01 DIAGNOSIS — Z09 Encounter for follow-up examination after completed treatment for conditions other than malignant neoplasm: Secondary | ICD-10-CM

## 2015-08-01 DIAGNOSIS — C3402 Malignant neoplasm of left main bronchus: Secondary | ICD-10-CM | POA: Diagnosis not present

## 2015-08-01 DIAGNOSIS — Z7951 Long term (current) use of inhaled steroids: Secondary | ICD-10-CM | POA: Insufficient documentation

## 2015-08-01 DIAGNOSIS — Z7901 Long term (current) use of anticoagulants: Secondary | ICD-10-CM | POA: Diagnosis not present

## 2015-08-01 DIAGNOSIS — R05 Cough: Secondary | ICD-10-CM | POA: Diagnosis not present

## 2015-08-01 HISTORY — PX: PORTACATH PLACEMENT: SHX2246

## 2015-08-01 SURGERY — INSERTION, TUNNELED CENTRAL VENOUS DEVICE, WITH PORT
Anesthesia: General

## 2015-08-01 MED ORDER — OXYCODONE-ACETAMINOPHEN 5-325 MG PO TABS: 1.0000 | ORAL_TABLET | Freq: Four times a day (QID) | ORAL | Status: DC | PRN

## 2015-08-01 MED ORDER — DEXAMETHASONE SODIUM PHOSPHATE 4 MG/ML IJ SOLN
INTRAMUSCULAR | Status: DC | PRN
  Administered 2015-08-01: 10 mg via INTRAVENOUS

## 2015-08-01 MED ORDER — SODIUM CHLORIDE 0.9 % IV SOLN
INTRAVENOUS | Status: DC | PRN
  Administered 2015-08-01: 35 mL via INTRAMUSCULAR

## 2015-08-01 MED ORDER — FENTANYL CITRATE (PF) 100 MCG/2ML IJ SOLN: 25.0000 ug | INTRAMUSCULAR | Status: DC | PRN

## 2015-08-01 MED ORDER — ACETAMINOPHEN 10 MG/ML IV SOLN
INTRAVENOUS | Status: AC
  Filled 2015-08-01: qty 100

## 2015-08-01 MED ORDER — CEFAZOLIN SODIUM-DEXTROSE 2-3 GM-% IV SOLR
INTRAVENOUS | Status: AC
  Administered 2015-08-01: 2 g via INTRAVENOUS
  Filled 2015-08-01: qty 50

## 2015-08-01 MED ORDER — LIDOCAINE HCL (PF) 1 % IJ SOLN
INTRAMUSCULAR | Status: AC
  Filled 2015-08-01: qty 30

## 2015-08-01 MED ORDER — ONDANSETRON HCL 4 MG/2ML IJ SOLN: 4.0000 mg | Freq: Once | INTRAMUSCULAR | Status: DC | PRN

## 2015-08-01 MED ORDER — LIDOCAINE HCL (CARDIAC) 20 MG/ML IV SOLN
INTRAVENOUS | Status: DC | PRN
  Administered 2015-08-01: 10 mg via INTRAVENOUS

## 2015-08-01 MED ORDER — FENTANYL CITRATE (PF) 100 MCG/2ML IJ SOLN
INTRAMUSCULAR | Status: DC | PRN
  Administered 2015-08-01 (×2): 50 ug via INTRAVENOUS

## 2015-08-01 MED ORDER — PROPOFOL 10 MG/ML IV BOLUS
INTRAVENOUS | Status: DC | PRN
  Administered 2015-08-01: 130 mg via INTRAVENOUS

## 2015-08-01 MED ORDER — ACETAMINOPHEN 10 MG/ML IV SOLN
INTRAVENOUS | Status: DC | PRN
  Administered 2015-08-01: 1000 mg via INTRAVENOUS

## 2015-08-01 MED ORDER — ONDANSETRON HCL 4 MG/2ML IJ SOLN
INTRAMUSCULAR | Status: DC | PRN
  Administered 2015-08-01: 4 mg via INTRAVENOUS

## 2015-08-01 MED ORDER — HEPARIN SODIUM (PORCINE) 5000 UNIT/ML IJ SOLN
INTRAMUSCULAR | Status: AC
  Filled 2015-08-01: qty 1

## 2015-08-01 MED ORDER — MIDAZOLAM HCL 2 MG/2ML IJ SOLN
INTRAMUSCULAR | Status: DC | PRN
  Administered 2015-08-01: 2 mg via INTRAVENOUS

## 2015-08-01 MED ORDER — LIDOCAINE HCL 1 % IJ SOLN
INTRAMUSCULAR | Status: DC | PRN
  Administered 2015-08-01: 8 mL

## 2015-08-01 MED ORDER — LACTATED RINGERS IV SOLN
INTRAVENOUS | Status: DC
  Administered 2015-08-01: 12:00:00 via INTRAVENOUS

## 2015-08-01 MED ORDER — CEFAZOLIN SODIUM-DEXTROSE 2-3 GM-% IV SOLR
2.0000 g | INTRAVENOUS | Status: AC
  Administered 2015-08-01: 2 g via INTRAVENOUS

## 2015-08-01 SURGICAL SUPPLY — 34 items
BAG DECANTER STRL (MISCELLANEOUS) ×2 IMPLANT
BLADE SURG SZ11 CARB STEEL (BLADE) ×2 IMPLANT
CANISTER SUCT 1200ML W/VALVE (MISCELLANEOUS) ×2 IMPLANT
CHLORAPREP W/TINT 26ML (MISCELLANEOUS) ×2 IMPLANT
COVER LIGHT HANDLE STERIS (MISCELLANEOUS) ×4 IMPLANT
DRAPE C-ARM XRAY 36X54 (DRAPES) ×2 IMPLANT
DRESSING TELFA 4X3 1S ST N-ADH (GAUZE/BANDAGES/DRESSINGS) ×2 IMPLANT
DRSG TEGADERM 2-3/8X2-3/4 SM (GAUZE/BANDAGES/DRESSINGS) ×2 IMPLANT
DRSG TEGADERM 4X4.75 (GAUZE/BANDAGES/DRESSINGS) ×2 IMPLANT
ELECT CAUTERY BLADE TIP 2.5 (TIP) ×2
ELECTRODE CAUTERY BLDE TIP 2.5 (TIP) ×1 IMPLANT
GLOVE EXAM LX STRL 7.5 (GLOVE) ×8 IMPLANT
GOWN STRL REUS W/ TWL LRG LVL3 (GOWN DISPOSABLE) ×2 IMPLANT
GOWN STRL REUS W/TWL LRG LVL3 (GOWN DISPOSABLE) ×2
IV NS 500ML (IV SOLUTION) ×1
IV NS 500ML BAXH (IV SOLUTION) ×1 IMPLANT
KIT RM TURNOVER STRD PROC AR (KITS) ×2 IMPLANT
LABEL OR SOLS (LABEL) ×2 IMPLANT
MARKER SKIN W/RULER 31145785 (MISCELLANEOUS) ×2 IMPLANT
NDL SAFETY 22GX1.5 (NEEDLE) ×2 IMPLANT
NEEDLE FILTER BLUNT 18X 1/2SAF (NEEDLE) ×1
NEEDLE FILTER BLUNT 18X1 1/2 (NEEDLE) ×1 IMPLANT
NS IRRIG 500ML POUR BTL (IV SOLUTION) ×2 IMPLANT
PACK PORT-A-CATH (MISCELLANEOUS) ×2 IMPLANT
PAD GROUND ADULT SPLIT (MISCELLANEOUS) ×2 IMPLANT
PORTACATH POWER 8F (Port) ×2 IMPLANT
SUT ETHILON 4-0 (SUTURE) ×2
SUT ETHILON 4-0 FS2 18XMFL BLK (SUTURE) ×2
SUT PROLENE 2 0 SH DA (SUTURE) ×4 IMPLANT
SUT VIC AB 3-0 SH 27 (SUTURE) ×1
SUT VIC AB 3-0 SH 27X BRD (SUTURE) ×1 IMPLANT
SUTURE ETHLN 4-0 FS2 18XMF BLK (SUTURE) ×2 IMPLANT
SYR 3ML LL SCALE MARK (SYRINGE) ×2 IMPLANT
SYRINGE 10CC LL (SYRINGE) ×2 IMPLANT

## 2015-08-01 NOTE — Anesthesia Procedure Notes (Signed)
Procedure Name: LMA Insertion Date/Time: 08/01/2015 1:04 PM Performed by: Jonna Clark Pre-anesthesia Checklist: Patient identified, Patient being monitored, Timeout performed, Emergency Drugs available and Suction available Patient Re-evaluated:Patient Re-evaluated prior to inductionOxygen Delivery Method: Circle system utilized Preoxygenation: Pre-oxygenation with 100% oxygen Intubation Type: IV induction Ventilation: Mask ventilation without difficulty LMA: LMA inserted LMA Size: 4.5 Tube type: Oral Number of attempts: 1 Placement Confirmation: positive ETCO2 and breath sounds checked- equal and bilateral Tube secured with: Tape Dental Injury: Teeth and Oropharynx as per pre-operative assessment

## 2015-08-01 NOTE — Progress Notes (Signed)
CXR done

## 2015-08-01 NOTE — Progress Notes (Signed)
Being d/c from PACU. No beds available in SDS.

## 2015-08-01 NOTE — Brief Op Note (Signed)
08/01/2015  3:00 PM  PATIENT:  Curtis Freeman  74 y.o. male  PRE-OPERATIVE DIAGNOSIS:  CARCINOMA LEFT LUNG  POST-OPERATIVE DIAGNOSIS:  carcinoma left lung  PROCEDURE:  Procedure(s): INSERTION PORT-A-CATH (N/A)  SURGEON:  Surgeon(s) and Role:    * Nestor Lewandowsky, MD - Primary  PHYSICIAN ASSISTANT:   ASSISTANTS: none   ANESTHESIA:   general  EBL:  Total I/O In: 600 [I.V.:600] Out: 10 [Blood:10]  BLOOD ADMINISTERED:none  DRAINS: none   LOCAL MEDICATIONS USED:  MARCAINE     SPECIMEN:  No Specimen  DISPOSITION OF SPECIMEN:  N/A  COUNTS:  YES  TOURNIQUET:  * No tourniquets in log *  DICTATION: .Dragon Dictation  PLAN OF CARE: Discharge to home after PACU  PATIENT DISPOSITION:  PACU - hemodynamically stable.   Delay start of Pharmacological VTE agent (>24hrs) due to surgical blood loss or risk of bleeding: yes

## 2015-08-01 NOTE — Anesthesia Preprocedure Evaluation (Addendum)
Anesthesia Evaluation  Patient identified by MRN, date of birth, ID band Patient awake    Reviewed: Allergy & Precautions, NPO status , Patient's Chart, lab work & pertinent test results  Airway Mallampati: II       Dental  (+) Teeth Intact, Caps   Pulmonary sleep apnea , COPD COPD inhaler, former smoker,    + decreased breath sounds      Cardiovascular hypertension, Pt. on medications Normal cardiovascular exam    Neuro/Psych CVA, No Residual Symptoms    GI/Hepatic negative GI ROS, Neg liver ROS,   Endo/Other  negative endocrine ROS  Renal/GU negative Renal ROS     Musculoskeletal   Abdominal Normal abdominal exam  (+)   Peds  Hematology negative hematology ROS (+) anemia ,   Anesthesia Other Findings   Reproductive/Obstetrics                           Anesthesia Physical Anesthesia Plan  ASA: III  Anesthesia Plan: General   Post-op Pain Management:    Induction: Intravenous  Airway Management Planned: LMA  Additional Equipment:   Intra-op Plan:   Post-operative Plan: Extubation in OR  Informed Consent: I have reviewed the patients History and Physical, chart, labs and discussed the procedure including the risks, benefits and alternatives for the proposed anesthesia with the patient or authorized representative who has indicated his/her understanding and acceptance.     Plan Discussed with: CRNA  Anesthesia Plan Comments:         Anesthesia Quick Evaluation

## 2015-08-01 NOTE — Anesthesia Postprocedure Evaluation (Signed)
  Anesthesia Post-op Note  Patient: Curtis Freeman  Procedure(s) Performed: Procedure(s): INSERTION PORT-A-CATH (N/A)  Anesthesia type:General  Patient location: PACU  Post pain: Pain level controlled  Post assessment: Post-op Vital signs reviewed, Patient's Cardiovascular Status Stable, Respiratory Function Stable, Patent Airway and No signs of Nausea or vomiting  Post vital signs: Reviewed and stable  Last Vitals:  Filed Vitals:   08/01/15 1451  BP: 114/77  Pulse: 78  Temp: 37 C  Resp: 20    Level of consciousness: awake, alert  and patient cooperative  Complications: No apparent anesthesia complications

## 2015-08-01 NOTE — H&P (View-Only) (Signed)
Curtis Freeman Inpatient Post-Op Note  Patient ID: Curtis Freeman, male   DOB: 1941-03-31, 74 y.o.   MRN: [id number]  HISTORY: This patient is known to me. He has a history of metastatic lung cancer. I discussed his care with Dr. Ramond Craver yesterday and it was felt that the patient would require a Port-A-Cath insertion for ongoing continued care. 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 However the patient is still concerned about this issue like to discuss the pros and cons of Port-A-Cath insertion with his anticoagulation status. The patient states that he has had some cough but no hemoptysis. He's had no fevers or chills. He is completed 11 radiation therapy sessions and is not scheduled to have any further.   Filed Vitals:   07/28/15 0932  BP: 125/71  Pulse: 70  Temp: 96.7 F (35.9 C)  Resp: 18     EXAM: Resp: Lungs are clear bilaterally.  No respiratory distress, normal effort. Heart:  Regular without murmurs Abd:  Abdomen is soft, non distended and non tender. No masses are palpable.  There is no rebound and no guarding.  Neurological: Alert and oriented to person, place, and time. Coordination normal.  Skin: Skin is warm and dry. No rash noted. No diaphoretic. No erythema. No pallor.  Psychiatric: Normal mood and affect. Normal behavior. Judgment and thought content normal.    ASSESSMENT: I have reviewed with the patient the indications and risks of Port-A-Cath placement. Risks of bleeding, infection and pneumothorax were all discussed. I also discussed his care with our pharmacist and we have recommended that he be bridged with Lovenox 80 mg subcutaneously per day. He will stop his Lovenox on Monday and he will have his surgery on Tuesday. I explained this all to the patient and his wife and they are in agreement to proceed.   PLAN:   We will obtain a  consent today for Port-A-Cath insertion. His laboratory studies are essentially acceptable for surgery. A prescription was written for Lovenox and he was told to stop his Xarelto today which she has already taken today's dose.    Nestor Lewandowsky, MD

## 2015-08-01 NOTE — Interval H&P Note (Signed)
History and Physical Interval Note:  08/01/2015 11:36 AM  Curtis Freeman  has presented today for surgery, with the diagnosis of CARCINOMA LEFT LUNG  The various methods of treatment have been discussed with the patient and family. After consideration of risks, benefits and other options for treatment, the patient has consented to  Procedure(s): INSERTION PORT-A-CATH (N/A) as a surgical intervention .  The patient's history has been reviewed, patient examined, no change in status, stable for surgery.  I have reviewed the patient's chart and labs.  Questions were answered to the patient's satisfaction.     Nestor Lewandowsky

## 2015-08-01 NOTE — Transfer of Care (Signed)
Immediate Anesthesia Transfer of Care Note  Patient: Curtis Freeman  Procedure(s) Performed: Procedure(s): INSERTION PORT-A-CATH (N/A)  Patient Location: PACU  Anesthesia Type:General  Level of Consciousness: sedated and responds to stimulation  Airway & Oxygen Therapy: Patient Spontanous Breathing and Patient connected to face mask oxygen  Post-op Assessment: Report given to RN and Post -op Vital signs reviewed and stable  Post vital signs: Reviewed and stable  Last Vitals:  Filed Vitals:   08/01/15 1451  BP: 114/77  Pulse: 78  Temp: 37 C  Resp: 20    Complications: No apparent anesthesia complications

## 2015-08-01 NOTE — Op Note (Signed)
08/01/2015  4:31 PM  PATIENT:  Curtis Freeman  74 y.o. male  PRE-OPERATIVE DIAGNOSIS:  CARCINOMA LEFT LUNG  POST-OPERATIVE DIAGNOSIS:  carcinoma left lung  PROCEDURE:  Procedure(s): INSERTION PORT-A-CATH (N/A)  SURGEON:  Surgeon(s) and Role:    * Nestor Lewandowsky, MD - Primary  ASSISTANTS:  None  ANESTHESIA:   LMA  DICTATION:   The patient was brought to the operating suite and placed in the supine position. Using the ultrasound machine the right IJ was identified and suitable for cannulation. The patient was then prepped and draped in usual sterile fashion. The right external jugular vein was also suitable for cannulation. A small skin incision was made over the vein and the vein was dissected. It was encircled with 2-0 silk ties. A small venotomy was created and the catheter was inserted through the venotomy into the external jugular vein. The catheter was advanced but could go no further than the suprasternal notch. We attempted to turn the patient's head from right to left however there was no success in advancing the catheter into the intrathoracic vena cava. Therefore the external jugular vein approach was abandoned and the vein was ligated. The wounds were irrigated and closed with nylon.   The right internal jugular vein was percutaneously catheterized. A wire was placed into the venous system under fluoroscopic guidance. An appropriate site was selected on the chest wall and a Port-A-Cath pocket was created. The catheter was tunneled from the port site up to the insertion site. The catheter was then inserted through a peel-away sheath and positioned at the appropriate level in the superior vena cava. The catheter was then assembled and aspirated and flushed easily. It was then secured to the anterior chest wall with interrupted Prolene sutures. The catheter was flushed one last time and the wounds were then closed. The subcutaneous tissues were closed with running absorbable sutures and  the skin with nylon. Sterile dressings were applied. Patient was then transported to the recovery room in stable condition.   Nestor Lewandowsky, MD

## 2015-08-02 ENCOUNTER — Ambulatory Visit: Payer: Medicare Other

## 2015-08-03 ENCOUNTER — Ambulatory Visit: Payer: Medicare Other | Admitting: Cardiothoracic Surgery

## 2015-08-03 ENCOUNTER — Ambulatory Visit: Payer: Medicare Other

## 2015-08-03 NOTE — Patient Instructions (Signed)
Pemetrexed injection What is this medicine? PEMETREXED (PEM e TREX ed) is a chemotherapy drug. This medicine affects cells that are rapidly growing, such as cancer cells and cells in your mouth and stomach. It is usually used to treat lung cancers like non-small cell lung cancer and mesothelioma. It may also be used to treat other cancers. This medicine may be used for other purposes; ask your health care provider or pharmacist if you have questions. COMMON BRAND NAME(S): Alimta What should I tell my health care provider before I take this medicine? They need to know if you have any of these conditions: -if you frequently drink alcohol containing beverages -infection (especially a virus infection such as chickenpox, cold sores, or herpes) -kidney disease -liver disease -low blood counts, like low platelets, red bloods, or white blood cells -an unusual or allergic reaction to pemetrexed, mannitol, other medicines, foods, dyes, or preservatives -pregnant or trying to get pregnant -breast-feeding How should I use this medicine? This drug is given as an infusion into a vein. It is administered in a hospital or clinic by a specially trained health care professional. Talk to your pediatrician regarding the use of this medicine in children. Special care may be needed. Overdosage: If you think you have taken too much of this medicine contact a poison control center or emergency room at once. NOTE: This medicine is only for you. Do not share this medicine with others. What if I miss a dose? It is important not to miss your dose. Call your doctor or health care professional if you are unable to keep an appointment. What may interact with this medicine? -aspirin and aspirin-like medicines -medicines to increase blood counts like filgrastim, pegfilgrastim, sargramostim -methotrexate -NSAIDS, medicines for pain and inflammation, like ibuprofen or naproxen -probenecid -pyrimethamine -vaccines Talk to  your doctor or health care professional before taking any of these medicines: -acetaminophen -aspirin -ibuprofen -ketoprofen -naproxen This list may not describe all possible interactions. Give your health care provider a list of all the medicines, herbs, non-prescription drugs, or dietary supplements you use. Also tell them if you smoke, drink alcohol, or use illegal drugs. Some items may interact with your medicine. What should I watch for while using this medicine? Visit your doctor for checks on your progress. This drug may make you feel generally unwell. This is not uncommon, as chemotherapy can affect healthy cells as well as cancer cells. Report any side effects. Continue your course of treatment even though you feel ill unless your doctor tells you to stop. In some cases, you may be given additional medicines to help with side effects. Follow all directions for their use. Call your doctor or health care professional for advice if you get a fever, chills or sore throat, or other symptoms of a cold or flu. Do not treat yourself. This drug decreases your body's ability to fight infections. Try to avoid being around people who are sick. This medicine may increase your risk to bruise or bleed. Call your doctor or health care professional if you notice any unusual bleeding. Be careful brushing and flossing your teeth or using a toothpick because you may get an infection or bleed more easily. If you have any dental work done, tell your dentist you are receiving this medicine. Avoid taking products that contain aspirin, acetaminophen, ibuprofen, naproxen, or ketoprofen unless instructed by your doctor. These medicines may hide a fever. Call your doctor or health care professional if you get diarrhea or mouth sores. Do not treat  yourself. To protect your kidneys, drink water or other fluids as directed while you are taking this medicine. Men and women must use effective birth control while taking this  medicine. You may also need to continue using effective birth control for a time after stopping this medicine. Do not become pregnant while taking this medicine. Tell your doctor right away if you think that you or your partner might be pregnant. There is a potential for serious side effects to an unborn child. Talk to your health care professional or pharmacist for more information. Do not breast-feed an infant while taking this medicine. This medicine may lower sperm counts. What side effects may I notice from receiving this medicine? Side effects that you should report to your doctor or health care professional as soon as possible: -allergic reactions like skin rash, itching or hives, swelling of the face, lips, or tongue -low blood counts - this medicine may decrease the number of white blood cells, red blood cells and platelets. You may be at increased risk for infections and bleeding. -signs of infection - fever or chills, cough, sore throat, pain or difficulty passing urine -signs of decreased platelets or bleeding - bruising, pinpoint red spots on the skin, black, tarry stools, blood in the urine -signs of decreased red blood cells - unusually weak or tired, fainting spells, lightheadedness -breathing problems, like a dry cough -changes in emotions or moods -chest pain -confusion -diarrhea -high blood pressure -mouth or throat sores or ulcers -pain, swelling, warmth in the leg -pain on swallowing -swelling of the ankles, feet, hands -trouble passing urine or change in the amount of urine -vomiting -yellowing of the eyes or skin Side effects that usually do not require medical attention (report to your doctor or health care professional if they continue or are bothersome): -hair loss -loss of appetite -nausea -stomach upset This list may not describe all possible side effects. Call your doctor for medical advice about side effects. You may report side effects to FDA at  1-800-FDA-1088. Where should I keep my medicine? This drug is given in a hospital or clinic and will not be stored at home. NOTE: This sheet is a summary. It may not cover all possible information. If you have questions about this medicine, talk to your doctor, pharmacist, or health care provider.  2015, [employer]. (2008-06-28 13:24:03) Carboplatin injection What is this medicine? CARBOPLATIN (KAR boe pla tin) is a chemotherapy drug. It targets fast dividing cells, like cancer cells, and causes these cells to die. This medicine is used to treat ovarian cancer and many other cancers. This medicine may be used for other purposes; ask your health care provider or pharmacist if you have questions. COMMON BRAND NAME(S): Paraplatin What should I tell my health care provider before I take this medicine? They need to know if you have any of these conditions: -blood disorders -hearing problems -kidney disease -recent or ongoing radiation therapy -an unusual or allergic reaction to carboplatin, cisplatin, other chemotherapy, other medicines, foods, dyes, or preservatives -pregnant or trying to get pregnant -breast-feeding How should I use this medicine? This drug is usually given as an infusion into a vein. It is administered in a hospital or clinic by a specially trained health care professional. Talk to your pediatrician regarding the use of this medicine in children. Special care may be needed. Overdosage: If you think you have taken too much of this medicine contact a poison control center or emergency room at once. NOTE: This medicine is  only for you. Do not share this medicine with others. What if I miss a dose? It is important not to miss a dose. Call your doctor or health care professional if you are unable to keep an appointment. What may interact with this medicine? -medicines for seizures -medicines to increase blood counts like filgrastim, pegfilgrastim,  sargramostim -some antibiotics like amikacin, gentamicin, neomycin, streptomycin, tobramycin -vaccines Talk to your doctor or health care professional before taking any of these medicines: -acetaminophen -aspirin -ibuprofen -ketoprofen -naproxen This list may not describe all possible interactions. Give your health care provider a list of all the medicines, herbs, non-prescription drugs, or dietary supplements you use. Also tell them if you smoke, drink alcohol, or use illegal drugs. Some items may interact with your medicine. What should I watch for while using this medicine? Your condition will be monitored carefully while you are receiving this medicine. You will need important blood work done while you are taking this medicine. This drug may make you feel generally unwell. This is not uncommon, as chemotherapy can affect healthy cells as well as cancer cells. Report any side effects. Continue your course of treatment even though you feel ill unless your doctor tells you to stop. In some cases, you may be given additional medicines to help with side effects. Follow all directions for their use. Call your doctor or health care professional for advice if you get a fever, chills or sore throat, or other symptoms of a cold or flu. Do not treat yourself. This drug decreases your body's ability to fight infections. Try to avoid being around people who are sick. This medicine may increase your risk to bruise or bleed. Call your doctor or health care professional if you notice any unusual bleeding. Be careful brushing and flossing your teeth or using a toothpick because you may get an infection or bleed more easily. If you have any dental work done, tell your dentist you are receiving this medicine. Avoid taking products that contain aspirin, acetaminophen, ibuprofen, naproxen, or ketoprofen unless instructed by your doctor. These medicines may hide a fever. Do not become pregnant while taking this  medicine. Women should inform their doctor if they wish to become pregnant or think they might be pregnant. There is a potential for serious side effects to an unborn child. Talk to your health care professional or pharmacist for more information. Do not breast-feed an infant while taking this medicine. What side effects may I notice from receiving this medicine? Side effects that you should report to your doctor or health care professional as soon as possible: -allergic reactions like skin rash, itching or hives, swelling of the face, lips, or tongue -signs of infection - fever or chills, cough, sore throat, pain or difficulty passing urine -signs of decreased platelets or bleeding - bruising, pinpoint red spots on the skin, black, tarry stools, nosebleeds -signs of decreased red blood cells - unusually weak or tired, fainting spells, lightheadedness -breathing problems -changes in hearing -changes in vision -chest pain -high blood pressure -low blood counts - This drug may decrease the number of white blood cells, red blood cells and platelets. You may be at increased risk for infections and bleeding. -nausea and vomiting -pain, swelling, redness or irritation at the injection site -pain, tingling, numbness in the hands or feet -problems with balance, talking, walking -trouble passing urine or change in the amount of urine Side effects that usually do not require medical attention (report to your doctor or health care professional   if they continue or are bothersome): -hair loss -loss of appetite -metallic taste in the mouth or changes in taste This list may not describe all possible side effects. Call your doctor for medical advice about side effects. You may report side effects to FDA at 1-800-FDA-1088. Where should I keep my medicine? This drug is given in a hospital or clinic and will not be stored at home. NOTE: This sheet is a summary. It may not cover all possible information. If you  have questions about this medicine, talk to your doctor, pharmacist, or health care provider.  2015, [employer] Standard. (2008-03-01 14:38:05) Pegfilgrastim injection What is this medicine? PEGFILGRASTIM (peg fil GRA stim) is a long-acting granulocyte colony-stimulating factor that stimulates the growth of neutrophils, a type of white blood cell important in the body's fight against infection. It is used to reduce the incidence of fever and infection in patients with certain types of cancer who are receiving chemotherapy that affects the bone marrow. This medicine may be used for other purposes; ask your health care provider or pharmacist if you have questions. COMMON BRAND NAME(S): Neulasta What should I tell my health care provider before I take this medicine? They need to know if you have any of these conditions: -latex allergy -ongoing radiation therapy -sickle cell disease -skin reactions to acrylic adhesives (On-Body Injector only) -an unusual or allergic reaction to pegfilgrastim, filgrastim, other medicines, foods, dyes, or preservatives -pregnant or trying to get pregnant -breast-feeding How should I use this medicine? This medicine is for injection under the skin. If you get this medicine at home, you will be taught how to prepare and give the pre-filled syringe or how to use the On-body Injector. Refer to the patient Instructions for Use for detailed instructions. Use exactly as directed. Take your medicine at regular intervals. Do not take your medicine more often than directed. It is important that you put your used needles and syringes in a special sharps container. Do not put them in a trash can. If you do not have a sharps container, call your pharmacist or healthcare provider to get one. Talk to your pediatrician regarding the use of this medicine in children. Special care may be needed. Overdosage: If you think you have taken too much of this medicine contact a poison  control center or emergency room at once. NOTE: This medicine is only for you. Do not share this medicine with others. What if I miss a dose? It is important not to miss your dose. Call your doctor or health care professional if you miss your dose. If you miss a dose due to an On-body Injector failure or leakage, a new dose should be administered as soon as possible using a single prefilled syringe for manual use. What may interact with this medicine? Interactions have not been studied. Give your health care provider a list of all the medicines, herbs, non-prescription drugs, or dietary supplements you use. Also tell them if you smoke, drink alcohol, or use illegal drugs. Some items may interact with your medicine. This list may not describe all possible interactions. Give your health care provider a list of all the medicines, herbs, non-prescription drugs, or dietary supplements you use. Also tell them if you smoke, drink alcohol, or use illegal drugs. Some items may interact with your medicine. What should I watch for while using this medicine? You may need blood work done while you are taking this medicine. If you are going to need a MRI, CT scan, or  other procedure, tell your doctor that you are using this medicine (On-Body Injector only). What side effects may I notice from receiving this medicine? Side effects that you should report to your doctor or health care professional as soon as possible: -allergic reactions like skin rash, itching or hives, swelling of the face, lips, or tongue -dizziness -fever -pain, redness, or irritation at site where injected -pinpoint red spots on the skin -shortness of breath or breathing problems -stomach or side pain, or pain at the shoulder -swelling -tiredness -trouble passing urine Side effects that usually do not require medical attention (report to your doctor or health care professional if they continue or are bothersome): -bone pain -muscle  pain This list may not describe all possible side effects. Call your doctor for medical advice about side effects. You may report side effects to FDA at 1-800-FDA-1088. Where should I keep my medicine? Keep out of the reach of children. Store pre-filled syringes in a refrigerator between 2 and 8 degrees C (36 and 46 degrees F). Do not freeze. Keep in carton to protect from light. Throw away this medicine if it is left out of the refrigerator for more than 48 hours. Throw away any unused medicine after the expiration date. NOTE: This sheet is a summary. It may not cover all possible information. If you have questions about this medicine, talk to your doctor, pharmacist, or health care provider.  2015, [employer] Standard. (2014-02-24 16:14:05)

## 2015-08-04 ENCOUNTER — Ambulatory Visit: Payer: Medicare Other

## 2015-08-07 ENCOUNTER — Ambulatory Visit: Payer: Medicare Other

## 2015-08-07 ENCOUNTER — Telehealth: Payer: Self-pay | Admitting: *Deleted

## 2015-08-07 ENCOUNTER — Other Ambulatory Visit: Payer: Self-pay | Admitting: *Deleted

## 2015-08-07 DIAGNOSIS — C3492 Malignant neoplasm of unspecified part of left bronchus or lung: Secondary | ICD-10-CM

## 2015-08-07 MED ORDER — ONDANSETRON HCL 8 MG PO TABS: 8.0000 mg | ORAL_TABLET | Freq: Two times a day (BID) | ORAL | Status: DC

## 2015-08-07 MED ORDER — DEXAMETHASONE 4 MG PO TABS: ORAL_TABLET | ORAL | Status: DC

## 2015-08-07 NOTE — Telephone Encounter (Signed)
Pt needs to start decadron '4mg'$  1 tab twice a day, to take the day before chemo and the day after chemo.

## 2015-08-08 ENCOUNTER — Inpatient Hospital Stay: Payer: Medicare Other

## 2015-08-08 ENCOUNTER — Inpatient Hospital Stay (HOSPITAL_BASED_OUTPATIENT_CLINIC_OR_DEPARTMENT_OTHER): Payer: Medicare Other | Admitting: Oncology

## 2015-08-08 ENCOUNTER — Ambulatory Visit
Admission: RE | Admit: 2015-08-08 | Discharge: 2015-08-08 | Disposition: A | Discharge status: Home / Self Care | Payer: Medicare Other | Source: Ambulatory Visit | Attending: Oncology | Admitting: Oncology

## 2015-08-08 ENCOUNTER — Ambulatory Visit: Payer: Medicare Other

## 2015-08-08 ENCOUNTER — Encounter: Payer: Self-pay | Admitting: *Deleted

## 2015-08-08 ENCOUNTER — Telehealth: Payer: Self-pay | Admitting: *Deleted

## 2015-08-08 VITALS — BP 108/68 | HR 105 | Temp 96.9°F | Wt 176.5 lb

## 2015-08-08 DIAGNOSIS — R634 Abnormal weight loss: Secondary | ICD-10-CM

## 2015-08-08 DIAGNOSIS — C7951 Secondary malignant neoplasm of bone: Secondary | ICD-10-CM

## 2015-08-08 DIAGNOSIS — C3492 Malignant neoplasm of unspecified part of left bronchus or lung: Secondary | ICD-10-CM

## 2015-08-08 DIAGNOSIS — R531 Weakness: Secondary | ICD-10-CM

## 2015-08-08 DIAGNOSIS — I4891 Unspecified atrial fibrillation: Secondary | ICD-10-CM

## 2015-08-08 DIAGNOSIS — R0602 Shortness of breath: Secondary | ICD-10-CM | POA: Diagnosis not present

## 2015-08-08 DIAGNOSIS — R5383 Other fatigue: Secondary | ICD-10-CM

## 2015-08-08 DIAGNOSIS — R918 Other nonspecific abnormal finding of lung field: Secondary | ICD-10-CM | POA: Diagnosis not present

## 2015-08-08 DIAGNOSIS — Z7901 Long term (current) use of anticoagulants: Secondary | ICD-10-CM

## 2015-08-08 DIAGNOSIS — Z79899 Other long term (current) drug therapy: Secondary | ICD-10-CM

## 2015-08-08 DIAGNOSIS — J449 Chronic obstructive pulmonary disease, unspecified: Secondary | ICD-10-CM

## 2015-08-08 DIAGNOSIS — R630 Anorexia: Secondary | ICD-10-CM

## 2015-08-08 DIAGNOSIS — C3432 Malignant neoplasm of lower lobe, left bronchus or lung: Secondary | ICD-10-CM | POA: Diagnosis not present

## 2015-08-08 LAB — CBC WITH DIFFERENTIAL/PLATELET
Basophils Absolute: 0 10*3/uL (ref 0–0.1)
Basophils Relative: 1 %
Eosinophils Absolute: 0 10*3/uL (ref 0–0.7)
Eosinophils Relative: 1 %
HEMATOCRIT: 43.9 % (ref 40.0–52.0)
HEMOGLOBIN: 14.4 g/dL (ref 13.0–18.0)
Lymphocytes Relative: 14 %
Lymphs Abs: 1 10*3/uL (ref 1.0–3.6)
MCH: 27.3 pg (ref 26.0–34.0)
MCHC: 32.8 g/dL (ref 32.0–36.0)
MCV: 83.3 fL (ref 80.0–100.0)
MONO ABS: 0.8 10*3/uL (ref 0.2–1.0)
MONOS PCT: 11 %
NEUTROS ABS: 5.5 10*3/uL (ref 1.4–6.5)
NEUTROS PCT: 73 %
Platelets: 195 10*3/uL (ref 150–440)
RBC: 5.27 MIL/uL (ref 4.40–5.90)
RDW: 16.5 % — AB (ref 11.5–14.5)
WBC: 7.5 10*3/uL (ref 3.8–10.6)

## 2015-08-08 LAB — COMPREHENSIVE METABOLIC PANEL
ALBUMIN: 3.1 g/dL — AB (ref 3.5–5.0)
ALK PHOS: 125 U/L (ref 38–126)
ALT: 55 U/L (ref 17–63)
ANION GAP: 10 (ref 5–15)
AST: 45 U/L — ABNORMAL HIGH (ref 15–41)
BUN: 21 mg/dL — ABNORMAL HIGH (ref 6–20)
CHLORIDE: 103 mmol/L (ref 101–111)
CO2: 25 mmol/L (ref 22–32)
CREATININE: 1.09 mg/dL (ref 0.61–1.24)
Calcium: 8.7 mg/dL — ABNORMAL LOW (ref 8.9–10.3)
GFR calc non Af Amer: >60 mL/min (ref >60)
GLUCOSE: 86 mg/dL (ref 65–99)
Potassium: 4.4 mmol/L (ref 3.5–5.1)
SODIUM: 138 mmol/L (ref 135–145)
Total Bilirubin: 0.8 mg/dL (ref 0.3–1.2)
Total Protein: 6.9 g/dL (ref 6.5–8.1)

## 2015-08-08 LAB — MAGNESIUM: Magnesium: 1.8 mg/dL (ref 1.7–2.4)

## 2015-08-08 MED ORDER — PREDNISONE 10 MG PO TABS: ORAL_TABLET | ORAL | Status: DC

## 2015-08-08 MED ORDER — AZITHROMYCIN 500 MG PO TABS: 500.0000 mg | ORAL_TABLET | Freq: Every day | ORAL | Status: DC

## 2015-08-08 MED ORDER — CITALOPRAM HYDROBROMIDE 20 MG PO TABS: 20.0000 mg | ORAL_TABLET | Freq: Every day | ORAL | Status: DC

## 2015-08-08 NOTE — Progress Notes (Signed)
PA submitted to [facility] for ondansetron '8mg'$  tablets.

## 2015-08-08 NOTE — Progress Notes (Signed)
Patient does have living will.  Former smoker.  Patient acute add on today for weakness, increased SOB  and feeling horrible.  Had CXR prior to appointment.

## 2015-08-08 NOTE — Telephone Encounter (Signed)
  Oncology Nurse Navigator Documentation    Navigator Encounter Type: Telephone (08/08/15 1300)                          Wife called and requesting patient be seen today if possible. Reports extreme fatigue and sob. Denies fever, or other s/s. Call back number is [phone]

## 2015-08-08 NOTE — Telephone Encounter (Signed)
Schedule pt for chest xray today then come to cancer center right after to see md with labwork.

## 2015-08-08 NOTE — Telephone Encounter (Signed)
Called patient to inform him that decadron 4 mg has been escribed to his pharmacy.  He should take one tablet twice a day (breakfast and dinner) the day before and the day after his chemo treatment.  Patient verbalized understanding.

## 2015-08-08 NOTE — Addendum Note (Signed)
Addended by: Telford Nab on: 08/08/2015 02:22 PM   Modules accepted: Orders

## 2015-08-09 ENCOUNTER — Encounter: Payer: Self-pay | Admitting: Oncology

## 2015-08-09 ENCOUNTER — Ambulatory Visit: Payer: Medicare Other

## 2015-08-09 ENCOUNTER — Other Ambulatory Visit: Payer: Self-pay | Admitting: *Deleted

## 2015-08-09 DIAGNOSIS — C3492 Malignant neoplasm of unspecified part of left bronchus or lung: Secondary | ICD-10-CM

## 2015-08-09 NOTE — Progress Notes (Signed)
[place] @ [facility] [phone]  Fax:([phone] [place] OB: Aug 28, 1941  MR#: [id number]  LDJ#:[id number]  Patient Care Team: Tracie Harrier, MD as PCP - General (Internal Medicine)  Oncology History   1.  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  Patient has finished radiation therapy to the lung mass (palliative therapy because of hemoptysis) in August of 2016  CHIEF COMPLAINT:  Chief Complaint  Patient presents with  . Acute Visit    VISIT DIAGNOSIS:     ICD-9-CM ICD-10-CM   1. Carcinoma of left lung 162.9 C34.92 Nebulizers (VIOS AEROSOL DELIVERY SYSTEM) MISC     predniSONE (DELTASONE) 10 MG tablet     azithromycin (ZITHROMAX) 500 MG tablet     citalopram (CELEXA) 20 MG tablet     DISCONTINUED: enoxaparin (LOVENOX) 120 MG/0.8ML injection       Oncology Flowsheet 06/29/2015 07/10/2015 08/01/2015  denosumab (XGEVA) [place] - 120 mg -  dexamethasone (DECADRON) IJ - - -  ondansetron (ZOFRAN) IV - - -   1.  Patient has atrial fibrillation on a long-term xeralto 2.  History of cerebrovascular accident when he was taken off of xeralto 3.  Hypercholesterolemia 4.  Hypertension INTERVAL HISTORY:  74 year old gentleman started having cough and hemoptysis a week ago was admitted in the hospital.  Patient had a CT scan which revealed left lower lobe mass.  Patient underwent bronchoscopy.  Patient was started again on 0 all toe bronchoscopy revealed left lower lobe (and lingula) mass positive for malignancy.  Non-small cell type.  Some of the biopsy of subcarinal lymph nodes were negative for malignancy.  0 all toe patient does not have any more hemoptysis at present time.  No chest pain.  Patient had a pain in the left hip  2 weeks duration Patient was referred to me for further  evaluation and management regarding carcinoma of lung In July, 2016 Patient is here for further evaluation and treatment consideration.  Continues to have left hip pain.  No nausea.  No vomiting.  That scan shows multiple metastases in bilateral lymph node as well as a bone. No hemoptysis at present time.  Patient is starting radiation therapy on Monday July 27, 2015 Patient is here for further follow-up regarding stage IV carcinoma of lung.  Patient had negative EGFR.  _0 ALK NOT mutated. [id number] was not identified Assessment as finished radiation therapy Feeling weak and fatigued Received XGEVA on August 1. August 08, 2015 Patient came with a history of increasing shortness of breath poor appetite patient was on a small dose of prednisone wife to prednisone away because noticed that patient is becoming more the table.  Had a chest x-ray done.  Shortness of breath on exertion.  No nausea.  No vomiting.  No diarrhea.  Poor appetite losing weight  REVIEW OF SYSTEMS:   Gen. status: Extremely apprehensive individual.  Not in any acute distress.    general status: Patient is feeling weak and tired.  No change in a performance status.  No chills.  No fever. HEENT:   No evidence of stomatitis Lungs: Increasing shortness of breath on exertion.  Cough.  Yellowish expectoration. Cardiac: No chest pain or paroxysmal noctur As finished nal dyspnea.  History of atrial fibrillation GI: No nausea  no vomiting no diarrhea no abdominal pain Skin: No rash Lower extremity no swelling Neurological system: No tingling.  No numbness.  No other focal signs.  History of cerebrovascular accident in April with residual weakness Musculoskeletal system no bony pains GU: Patient had a high PSA but biopsy was negative  As per HPI. Otherwise, a complete review of systems is negatve.  PAST MEDICAL HISTORY: Past Medical History  Diagnosis Date  . Hypertension   . Pneumonia   . Cancer     lung mass  .  Stroke   . COPD (chronic obstructive pulmonary disease)   . Carcinoma of left lung 07/05/2015  . Anorexia 03/2016  . Stroke 03/2016    PAST SURGICAL HISTORY:  had a cataract surgery in the past.  Has a large prostate biopsy was done.   FAMILY HISTORY There is no significant family history of breast cancer, ovarian cancer, colon cancer  grandmother had stomach cancer family history of heart disease     ADVANCED DIRECTIVES:  Patient does have advance healthcare directive, Patient   does not desire to make any changes  HEALTH MAINTENANCE: Social History  Substance Use Topics  . Smoking status: Former Smoker    Quit date: 06/27/2005  . Smokeless tobacco: None  . Alcohol Use: None     Allergies  Allergen Reactions  . Celebrex [Celecoxib]   . Zolpidem Tartrate Anxiety    Current Outpatient Prescriptions  Medication Sig Dispense Refill  . albuterol (PROVENTIL) (2.5 MG/3ML) 0.083% nebulizer solution Take 3 mLs (2.5 mg total) by nebulization every 4 (four) hours as needed for wheezing or shortness of breath. 75 mL 12  . atorvastatin (LIPITOR) 20 MG tablet Take 20 mg by mouth daily.    . chlorpheniramine-HYDROcodone (TUSSIONEX) 10-8 MG/5ML SUER Take 5 mLs by mouth every 12 (twelve) hours as needed for cough. 140 mL 0  . dexamethasone (DECADRON) 4 MG tablet Take 1 tab two times a day the day before Alimta chemo. Take 2 tabs two times a day starting the day after chemo for 3 days. 30 tablet 1  . Fluticasone-Salmeterol (ADVAIR DISKUS) 250-50 MCG/DOSE AEPB Inhale 1 puff into the lungs 2 (two) times daily.    . folic acid (FOLVITE) 1 MG tablet Take 1 tablet (1 mg total) by mouth daily. 30 tablet 3  . hydrochlorothiazide (HYDRODIURIL) 12.5 MG tablet Take 12.5 mg by mouth daily.    . LORazepam (ATIVAN) 0.5 MG tablet Take 0.5 mg by mouth every 6 (six) hours as needed for anxiety.    . losartan (COZAAR) 100 MG tablet Take 100 mg by mouth daily.    . ondansetron (ZOFRAN) 8 MG tablet Take 1  tablet (8 mg total) by mouth 2 (two) times daily. Start the day after chemo for 3 days. Then take as needed for nausea or vomiting. 30 tablet 1  . oxyCODONE-acetaminophen (PERCOCET/ROXICET) 5-325 MG per tablet Take 1-2 tablets by mouth every 6 (six) hours as needed for moderate pain. 30 tablet 0  . rivaroxaban (XARELTO) 20 MG TABS tablet Take 20 mg by mouth daily with supper.    . VENTOLIN HFA 108 (90 BASE) MCG/ACT inhaler Inhale 1 puff into the lungs every 4 (four) hours as needed for wheezing or shortness of breath.   0  . azithromycin (ZITHROMAX) 500 MG tablet Take 1 tablet (500 mg total) by mouth daily. 5 tablet 0  . citalopram (CELEXA) 20 MG tablet Take 1 tablet (20 mg total) by mouth daily. 30 tablet 3  .   Nebulizers (VIOS AEROSOL DELIVERY SYSTEM) MISC U UTD  0  . predniSONE (DELTASONE) 10 MG tablet Take 6 tabs po x 1 day, 5 tabs po x 1 day, 4 tabs po x 1 day, 3 tabs po x 1 day, 2 tabs po x 1 day, 1 tab po x 1 day, then stop 21 tablet 0   No current facility-administered medications for this visit.    OBJECTIVE: PHYSICAL EXAM:GENERAL:  Well developed, well nourished, sitting comfortably in the exam room in no acute distress.  Patient is walking with the help of cane  MENTAL STATUS:  Alert and oriented to person, place and time. HEAD.  Normocephalic, atraumatic, face symmetric, no Cushingoid features. EYES:    Pupils equal round and reactive to light and accomodation.  No conjunctivitis or scleral icterus. ENT:  Oropharynx clear without lesion.  Tongue normal. Mucous membranes moist.  RESPIRATORY:  Diminished and entry on both sides.  Emphysematous chest.  His dullness on percussion on the left lower chest . Course crepitation with at the left lower base CARDIOVASCULAR:  Regular rate and rhythm without murmur, rub or gallop. Abdominal exam revealed normal bowel sounds. The abdomen was soft, non-tender, and without masses, organomegaly, or appreciable enlargement of the abdominal  aorta. BACK:  No CVA tenderness.  No tenderness on percussion of the back or rib cage.  Intermittent tenderness in the left hip SKIN:  No rashes, ulcers or lesions. EXTREMITIES: No edema, no skin discoloration or tenderness.  No palpable cords. LYMPH NODES: No palpable cervical, supraclavicular, axillary or inguinal adenopathy  NEUROLOGICAL: She has residual weakness on the right side walking with the help of cane PSYCH:  Appropriate.very apprehensive.    Filed Vitals:   08/08/15 1639  BP: 108/68  Pulse: 105  Temp: 96.9 F (36.1 C)     Body mass index is 25.33 kg/(m^2).    ECOG FS:1 - Symptomatic but completely ambulatory  LAB RESULTS:  Appointment on 08/08/2015  Component Date Value Ref Range Status  . Sodium 08/08/2015 138  135 - 145 mmol/L Final  . Potassium 08/08/2015 4.4  3.5 - 5.1 mmol/L Final  . Chloride 08/08/2015 103  101 - 111 mmol/L Final  . CO2 08/08/2015 25  22 - 32 mmol/L Final  . Glucose, Bld 08/08/2015 86  65 - 99 mg/dL Final  . BUN 08/08/2015 21* 6 - 20 mg/dL Final  . Creatinine, Ser 08/08/2015 1.09  0.61 - 1.24 mg/dL Final  . Calcium 08/08/2015 8.7* 8.9 - 10.3 mg/dL Final  . Total Protein 08/08/2015 6.9  6.5 - 8.1 g/dL Final  . Albumin 08/08/2015 3.1* 3.5 - 5.0 g/dL Final  . AST 08/08/2015 45* 15 - 41 U/L Final  . ALT 08/08/2015 55  17 - 63 U/L Final  . Alkaline Phosphatase 08/08/2015 125  38 - 126 U/L Final  . Total Bilirubin 08/08/2015 0.8  0.3 - 1.2 mg/dL Final  . GFR calc non Af Amer 08/08/2015 >60  >60 mL/min Final  . GFR calc Af Amer 08/08/2015 >60  >60 mL/min Final   Comment: (NOTE) The eGFR has been calculated using the CKD EPI equation. This calculation has not been validated in all clinical situations. eGFR's persistently <60 mL/min signify possible Chronic Kidney Disease.   . Anion gap 08/08/2015 10  5 - 15 Final  . WBC 08/08/2015 7.5  3.8 - 10.6 K/uL Final  . RBC 08/08/2015 5.27  4.40 - 5.90 MIL/uL Final  . Hemoglobin 08/08/2015 14.4  13.0 -  18.0 g/dL   Final  . HCT 08/08/2015 43.9  40.0 - 52.0 % Final  . MCV 08/08/2015 83.3  80.0 - 100.0 fL Final  . MCH 08/08/2015 27.3  26.0 - 34.0 pg Final  . MCHC 08/08/2015 32.8  32.0 - 36.0 g/dL Final  . RDW 08/08/2015 16.5* 11.5 - 14.5 % Final  . Platelets 08/08/2015 195  150 - 440 K/uL Final  . Neutrophils Relative % 08/08/2015 73   Final  . Neutro Abs 08/08/2015 5.5  1.4 - 6.5 K/uL Final  . Lymphocytes Relative 08/08/2015 14   Final  . Lymphs Abs 08/08/2015 1.0  1.0 - 3.6 K/uL Final  . Monocytes Relative 08/08/2015 11   Final  . Monocytes Absolute 08/08/2015 0.8  0.2 - 1.0 K/uL Final  . Eosinophils Relative 08/08/2015 1   Final  . Eosinophils Absolute 08/08/2015 0.0  0 - 0.7 K/uL Final  . Basophils Relative 08/08/2015 1   Final  . Basophils Absolute 08/08/2015 0.0  0 - 0.1 K/uL Final  . Magnesium 08/08/2015 1.8  1.7 - 2.4 mg/dL Final     STUDIES: Dg Chest 2 View  08/08/2015   CLINICAL DATA:  History of lung cancer. Feeling worse today which shortness of breath.  EXAM: CHEST  2 VIEW  COMPARISON:  08/01/2015  FINDINGS: Right IJ central venous catheter unchanged with tip overlying the SVC. Exam demonstrates adequate lung volumes with evidence of patient's known lung cancer over the posterior medial left midlung. Mild chronic bibasilar interstitial changes. Cardiomediastinal silhouette and remainder the exam is unchanged.  IMPRESSION: No acute findings.  Moderate focal opacification over the posterior medial left mid thorax without significant change known to represent patient's lung cancer. Stable chronic bibasilar interstitial disease.   Electronically Signed   By: Daniel  Boyle M.D.   On: 08/08/2015 15:52   Dg Chest Port 1 View  08/01/2015   CLINICAL DATA:  Status post port insertion.  EXAM: PORTABLE CHEST - 1 VIEW  COMPARISON:  06/30/2015  FINDINGS: There has been interval placement of central venous catheter from right internal jugular approach. The tip is seen at the expected location of  distal superior vena cava. There is no evidence of pneumothorax.  Cardiomediastinal silhouette is normal. Mediastinal contours appear intact.  Again seen is opacification of the left hemithorax with perihilar and lower lobe predominance. There is diffuse coarsening of the interstitial markings.  Osseous structures are without acute abnormality. Soft tissues are grossly normal.  IMPRESSION: Left lower lobe airspace consolidation, not significantly changed.  Interval placement of central venous catheter. No evidence of pneumothorax.   Electronically Signed   By: Dobrinka  Dimitrova M.D.   On: 08/01/2015 16:12   Dg C-arm 1-60 Min-no Report  08/01/2015   CLINICAL DATA: surgery   C-ARM 1-60 MINUTES  Fluoroscopy was utilized by the requesting physician.  No radiographic  interpretation.     ASSESSMENT:  Non-small cell carcinoma of lung left lower lobe.  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 Patient has finished palliative radiation therapy Chest x-ray has been reviewed shows dense lower lobe infiltrate may be radiation pneumonitis and pulmonary infection Chart patient on prednisone 60 mg taper by 10 20 Zithromax 500 mg daily for 5 days If shortness of breath continues of poor appetite is CT scan to rule out any other etiology like pulmonary embolism Patient is scheduled to start chemotherapy next week Chest x-rays been reviewed independently and reviewed with the patient  Radiation pneumonitis, acute pulmonary infection are differential diagnosis other   possibility cannot be ruled out.   Carcinoma of left lung   Staging form: Lung, AJCC 7th Edition     Clinical: Stage 4 (T4, N2.  m1) - Signed by Forest Gleason, MD on 07/05/2015  Forest Gleason, MD   08/09/2015 8:02 AM

## 2015-08-10 ENCOUNTER — Inpatient Hospital Stay: Payer: Medicare Other

## 2015-08-10 ENCOUNTER — Other Ambulatory Visit: Payer: Self-pay | Admitting: *Deleted

## 2015-08-10 ENCOUNTER — Ambulatory Visit: Payer: Medicare Other

## 2015-08-10 ENCOUNTER — Inpatient Hospital Stay: Payer: Medicare Other | Attending: Cardiothoracic Surgery | Admitting: Cardiothoracic Surgery

## 2015-08-10 DIAGNOSIS — Z7901 Long term (current) use of anticoagulants: Secondary | ICD-10-CM | POA: Diagnosis not present

## 2015-08-10 DIAGNOSIS — E876 Hypokalemia: Secondary | ICD-10-CM | POA: Insufficient documentation

## 2015-08-10 DIAGNOSIS — C3432 Malignant neoplasm of lower lobe, left bronchus or lung: Secondary | ICD-10-CM | POA: Diagnosis not present

## 2015-08-10 DIAGNOSIS — R1310 Dysphagia, unspecified: Secondary | ICD-10-CM | POA: Insufficient documentation

## 2015-08-10 DIAGNOSIS — F419 Anxiety disorder, unspecified: Secondary | ICD-10-CM | POA: Insufficient documentation

## 2015-08-10 DIAGNOSIS — I959 Hypotension, unspecified: Secondary | ICD-10-CM | POA: Insufficient documentation

## 2015-08-10 DIAGNOSIS — R5383 Other fatigue: Secondary | ICD-10-CM | POA: Insufficient documentation

## 2015-08-10 DIAGNOSIS — R531 Weakness: Secondary | ICD-10-CM | POA: Diagnosis not present

## 2015-08-10 DIAGNOSIS — Z23 Encounter for immunization: Secondary | ICD-10-CM | POA: Insufficient documentation

## 2015-08-10 DIAGNOSIS — C3492 Malignant neoplasm of unspecified part of left bronchus or lung: Secondary | ICD-10-CM

## 2015-08-10 DIAGNOSIS — Z5111 Encounter for antineoplastic chemotherapy: Secondary | ICD-10-CM | POA: Diagnosis present

## 2015-08-10 DIAGNOSIS — B370 Candidal stomatitis: Secondary | ICD-10-CM | POA: Insufficient documentation

## 2015-08-10 DIAGNOSIS — E785 Hyperlipidemia, unspecified: Secondary | ICD-10-CM | POA: Insufficient documentation

## 2015-08-10 DIAGNOSIS — Z7952 Long term (current) use of systemic steroids: Secondary | ICD-10-CM | POA: Diagnosis not present

## 2015-08-10 DIAGNOSIS — I10 Essential (primary) hypertension: Secondary | ICD-10-CM | POA: Diagnosis not present

## 2015-08-10 DIAGNOSIS — Z87891 Personal history of nicotine dependence: Secondary | ICD-10-CM | POA: Insufficient documentation

## 2015-08-10 DIAGNOSIS — I4891 Unspecified atrial fibrillation: Secondary | ICD-10-CM | POA: Diagnosis not present

## 2015-08-10 DIAGNOSIS — Z923 Personal history of irradiation: Secondary | ICD-10-CM | POA: Insufficient documentation

## 2015-08-10 DIAGNOSIS — J449 Chronic obstructive pulmonary disease, unspecified: Secondary | ICD-10-CM | POA: Diagnosis not present

## 2015-08-10 DIAGNOSIS — Z8673 Personal history of transient ischemic attack (TIA), and cerebral infarction without residual deficits: Secondary | ICD-10-CM | POA: Insufficient documentation

## 2015-08-10 DIAGNOSIS — Z79899 Other long term (current) drug therapy: Secondary | ICD-10-CM | POA: Insufficient documentation

## 2015-08-10 DIAGNOSIS — C3490 Malignant neoplasm of unspecified part of unspecified bronchus or lung: Secondary | ICD-10-CM

## 2015-08-10 DIAGNOSIS — E860 Dehydration: Secondary | ICD-10-CM | POA: Insufficient documentation

## 2015-08-10 DIAGNOSIS — C7951 Secondary malignant neoplasm of bone: Secondary | ICD-10-CM | POA: Diagnosis not present

## 2015-08-10 MED ORDER — CYANOCOBALAMIN 1000 MCG/ML IJ SOLN
1000.0000 ug | Freq: Once | INTRAMUSCULAR | Status: AC
  Administered 2015-08-10: 1000 ug via INTRAMUSCULAR
  Filled 2015-08-10: qty 1

## 2015-08-10 MED ORDER — LIDOCAINE-PRILOCAINE 2.5-2.5 % EX CREA: 1.0000 "application " | TOPICAL_CREAM | CUTANEOUS | Status: AC | PRN

## 2015-08-10 NOTE — Progress Notes (Signed)
Sutures intact in right chest wall x 1 and lateral neck x 2. No signs of infection. Sutures removed per md order. steristrips applied per policy. Patient discharged without further complaints.

## 2015-08-11 ENCOUNTER — Ambulatory Visit: Payer: Medicare Other

## 2015-08-12 LAB — ACID FAST SMEAR+CULTURE W/RFLX (ARMC ONLY)
Acid Fast Culture: NEGATIVE
Acid Fast Culture: NEGATIVE
Acid Fast Smear: NEGATIVE
Acid Fast Smear: NEGATIVE

## 2015-08-15 ENCOUNTER — Ambulatory Visit: Payer: Medicare Other

## 2015-08-16 ENCOUNTER — Inpatient Hospital Stay: Payer: Medicare Other

## 2015-08-16 ENCOUNTER — Inpatient Hospital Stay (HOSPITAL_BASED_OUTPATIENT_CLINIC_OR_DEPARTMENT_OTHER): Payer: Medicare Other | Admitting: Oncology

## 2015-08-16 ENCOUNTER — Encounter: Payer: Self-pay | Admitting: Oncology

## 2015-08-16 VITALS — BP 130/76 | HR 100 | Temp 96.4°F | Wt 179.0 lb

## 2015-08-16 DIAGNOSIS — C3432 Malignant neoplasm of lower lobe, left bronchus or lung: Secondary | ICD-10-CM

## 2015-08-16 DIAGNOSIS — R531 Weakness: Secondary | ICD-10-CM

## 2015-08-16 DIAGNOSIS — C7951 Secondary malignant neoplasm of bone: Secondary | ICD-10-CM | POA: Diagnosis not present

## 2015-08-16 DIAGNOSIS — Z5111 Encounter for antineoplastic chemotherapy: Secondary | ICD-10-CM | POA: Diagnosis not present

## 2015-08-16 DIAGNOSIS — Z79899 Other long term (current) drug therapy: Secondary | ICD-10-CM

## 2015-08-16 DIAGNOSIS — I4891 Unspecified atrial fibrillation: Secondary | ICD-10-CM

## 2015-08-16 DIAGNOSIS — Z87891 Personal history of nicotine dependence: Secondary | ICD-10-CM

## 2015-08-16 DIAGNOSIS — Z923 Personal history of irradiation: Secondary | ICD-10-CM | POA: Diagnosis not present

## 2015-08-16 DIAGNOSIS — I10 Essential (primary) hypertension: Secondary | ICD-10-CM

## 2015-08-16 DIAGNOSIS — R5383 Other fatigue: Secondary | ICD-10-CM

## 2015-08-16 DIAGNOSIS — F419 Anxiety disorder, unspecified: Secondary | ICD-10-CM

## 2015-08-16 DIAGNOSIS — C3492 Malignant neoplasm of unspecified part of left bronchus or lung: Secondary | ICD-10-CM

## 2015-08-16 DIAGNOSIS — J449 Chronic obstructive pulmonary disease, unspecified: Secondary | ICD-10-CM

## 2015-08-16 DIAGNOSIS — Z7952 Long term (current) use of systemic steroids: Secondary | ICD-10-CM

## 2015-08-16 DIAGNOSIS — Z8673 Personal history of transient ischemic attack (TIA), and cerebral infarction without residual deficits: Secondary | ICD-10-CM

## 2015-08-16 DIAGNOSIS — E785 Hyperlipidemia, unspecified: Secondary | ICD-10-CM

## 2015-08-16 DIAGNOSIS — Z7901 Long term (current) use of anticoagulants: Secondary | ICD-10-CM

## 2015-08-16 LAB — COMPREHENSIVE METABOLIC PANEL
ALBUMIN: 3.1 g/dL — AB (ref 3.5–5.0)
ALK PHOS: 106 U/L (ref 38–126)
ALT: 43 U/L (ref 17–63)
AST: 34 U/L (ref 15–41)
Anion gap: 11 (ref 5–15)
BILIRUBIN TOTAL: 0.7 mg/dL (ref 0.3–1.2)
BUN: 22 mg/dL — AB (ref 6–20)
CALCIUM: 8.2 mg/dL — AB (ref 8.9–10.3)
CO2: 21 mmol/L — AB (ref 22–32)
CREATININE: 1.11 mg/dL (ref 0.61–1.24)
Chloride: 105 mmol/L (ref 101–111)
GFR calc Af Amer: >60 mL/min (ref >60)
GFR calc non Af Amer: >60 mL/min (ref >60)
GLUCOSE: 288 mg/dL — AB (ref 65–99)
Potassium: 3.8 mmol/L (ref 3.5–5.1)
SODIUM: 137 mmol/L (ref 135–145)
TOTAL PROTEIN: 6.4 g/dL — AB (ref 6.5–8.1)

## 2015-08-16 LAB — CBC WITH DIFFERENTIAL/PLATELET
BASOS PCT: 0 %
Basophils Absolute: 0 10*3/uL (ref 0–0.1)
EOS ABS: 0 10*3/uL (ref 0–0.7)
Eosinophils Relative: 0 %
HEMATOCRIT: 40.3 % (ref 40.0–52.0)
HEMOGLOBIN: 13.4 g/dL (ref 13.0–18.0)
LYMPHS ABS: 0.3 10*3/uL — AB (ref 1.0–3.6)
Lymphocytes Relative: 5 %
MCH: 27.6 pg (ref 26.0–34.0)
MCHC: 33.2 g/dL (ref 32.0–36.0)
MCV: 83 fL (ref 80.0–100.0)
Monocytes Absolute: 0.2 10*3/uL (ref 0.2–1.0)
Monocytes Relative: 3 %
NEUTROS ABS: 6.6 10*3/uL — AB (ref 1.4–6.5)
NEUTROS PCT: 92 %
Platelets: 205 10*3/uL (ref 150–440)
RBC: 4.86 MIL/uL (ref 4.40–5.90)
RDW: 16.5 % — ABNORMAL HIGH (ref 11.5–14.5)
WBC: 7.2 10*3/uL (ref 3.8–10.6)

## 2015-08-16 LAB — MAGNESIUM: Magnesium: 2 mg/dL (ref 1.7–2.4)

## 2015-08-16 MED ORDER — PALONOSETRON HCL INJECTION 0.25 MG/5ML
0.2500 mg | Freq: Once | INTRAVENOUS | Status: AC
  Administered 2015-08-16: 0.25 mg via INTRAVENOUS
  Filled 2015-08-16: qty 5

## 2015-08-16 MED ORDER — SODIUM CHLORIDE 0.9 % IV SOLN
500.0000 mg/m2 | Freq: Once | INTRAVENOUS | Status: AC
  Administered 2015-08-16: 1000 mg via INTRAVENOUS
  Filled 2015-08-16: qty 40

## 2015-08-16 MED ORDER — HEPARIN SOD (PORK) LOCK FLUSH 100 UNIT/ML IV SOLN
500.0000 [IU] | Freq: Once | INTRAVENOUS | Status: AC | PRN
  Administered 2015-08-16: 500 [IU]
  Filled 2015-08-16: qty 5

## 2015-08-16 MED ORDER — SODIUM CHLORIDE 0.9 % IJ SOLN
10.0000 mL | INTRAMUSCULAR | Status: DC | PRN
  Administered 2015-08-16: 10 mL
  Filled 2015-08-16: qty 10

## 2015-08-16 MED ORDER — DENOSUMAB 120 MG/1.7ML ~~LOC~~ SOLN
120.0000 mg | Freq: Once | SUBCUTANEOUS | Status: AC
  Administered 2015-08-16: 120 mg via SUBCUTANEOUS
  Filled 2015-08-16: qty 1.7

## 2015-08-16 MED ORDER — SODIUM CHLORIDE 0.9 % IV SOLN
460.0000 mg | Freq: Once | INTRAVENOUS | Status: AC
  Administered 2015-08-16: 460 mg via INTRAVENOUS
  Filled 2015-08-16: qty 46

## 2015-08-16 MED ORDER — SODIUM CHLORIDE 0.9 % IV SOLN
Freq: Once | INTRAVENOUS | Status: AC
  Administered 2015-08-16: 10:00:00 via INTRAVENOUS
  Filled 2015-08-16: qty 1000

## 2015-08-16 MED ORDER — SODIUM CHLORIDE 0.9 % IV SOLN
Freq: Once | INTRAVENOUS | Status: AC
  Administered 2015-08-16: 11:00:00 via INTRAVENOUS
  Filled 2015-08-16: qty 5

## 2015-08-16 NOTE — Progress Notes (Signed)
[place] @ [facility] [phone]  Fax:([phone] [place] OB: 1941-02-14  MR#: [id number]  JYN#:[id number]  Patient Care Team: Tracie Harrier, MD as PCP - General (Internal Medicine)  Oncology History   1.  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  Patient has finished radiation therapy to the lung mass (palliative therapy because of hemoptysis) in August of 2016  CHIEF COMPLAINT:  Chief Complaint  Patient presents with  . Follow-up    VISIT DIAGNOSIS:   No diagnosis found.     Oncology Flowsheet 06/29/2015 07/10/2015 08/01/2015 08/10/2015  cyanocobalamin ((VITAMIN B-12)) IM - - - 1,000 mcg  denosumab (XGEVA) [place] - 120 mg - -  dexamethasone (DECADRON) IJ - - - -  ondansetron (ZOFRAN) IV - - - -   1.  Patient has atrial fibrillation on a long-term xeralto 2.  History of cerebrovascular accident when he was taken off of xeralto 3.  Hypercholesterolemia 4.  Hypertension INTERVAL HISTORY:  74 year old gentleman started having cough and hemoptysis a week ago was admitted in the hospital.  Patient had a CT scan which revealed left lower lobe mass.  Patient underwent bronchoscopy.  Patient was started again on 0 all toe bronchoscopy revealed left lower lobe (and lingula) mass positive for malignancy.  Non-small cell type.  Some of the biopsy of subcarinal lymph nodes were negative for malignancy.  0 all toe patient does not have any more hemoptysis at present time.  No chest pain.  Patient had a pain in the left hip  2 weeks duration Patient was referred to me for further evaluation and management regarding carcinoma of lung In July, 2016 Patient is here for further evaluation and treatment consideration.  Continues to have left hip pain.  No nausea.  No vomiting.  That scan shows  multiple metastases in bilateral lymph node as well as a bone. No hemoptysis at present time.  Patient is starting radiation therapy on Monday July 27, 2015 Patient is here for further follow-up regarding stage IV carcinoma of lung.  Patient had negative EGFR.  \ ALK NOT mutated. [id number] was not identified Assessment as finished radiation therapy Feeling weak and fatigued Received XGEVA on August 1. August 08, 2015 Patient came with a history of increasing shortness of breath poor appetite patient was on a small dose of prednisone wife to prednisone away because noticed that patient is becoming more the table.  Had a chest x-ray done.  Shortness of breath on exertion.  No nausea.  No vomiting.  No diarrhea.  Poor appetite losing weight August 16, 2015 Patient is here for ongoing evaluation and treatment consideration.  Feeling much stronger.  Cough and shortness of breath is improved.  No nausea.  No vomiting.  No diarrhea. Patient is here to proceed with the first cycle of chemotherapy with carboplatinum and Alimta. Also for XGEVA because of history of bone metastases REVIEW OF SYSTEMS:   Gen. status: Extremely apprehensive individual.  Not in any acute distress.    general status: Patient is feeling weak and tired.  No change in a performance status.  No chills.  No fever. HEENT:   No evidence of stomatitis Lungs: Increasing shortness of breath on exertion.  Cough.  Yellowish expectoration. Cardiac: No chest pain or  paroxysmal noctur As finished nal dyspnea.  History of atrial fibrillation GI: No nausea no vomiting no diarrhea no abdominal pain Skin: No rash Lower extremity no swelling Neurological system: No tingling.  No numbness.  No other focal signs.  History of cerebrovascular accident in April with residual weakness Musculoskeletal system no bony pains GU: Patient had a high PSA but biopsy was negative  As per HPI. Otherwise, a complete review of systems is  negatve.  PAST MEDICAL HISTORY: Past Medical History  Diagnosis Date  . Hypertension   . Pneumonia   . Cancer     lung mass  . Stroke   . COPD (chronic obstructive pulmonary disease)   . Carcinoma of left lung 07/05/2015  . Anorexia 03/2016  . Stroke 03/2016    PAST SURGICAL HISTORY:  had a cataract surgery in the past.  Has a large prostate biopsy was done.   FAMILY HISTORY There is no significant family history of breast cancer, ovarian cancer, colon cancer  grandmother had stomach cancer family history of heart disease     ADVANCED DIRECTIVES:  Patient does have advance healthcare directive, Patient   does not desire to make any changes  HEALTH MAINTENANCE: Social History  Substance Use Topics  . Smoking status: Former Smoker    Quit date: 06/27/2005  . Smokeless tobacco: None  . Alcohol Use: None     Allergies  Allergen Reactions  . Celebrex [Celecoxib]   . Zolpidem Tartrate Anxiety    Current Outpatient Prescriptions  Medication Sig Dispense Refill  . albuterol (PROVENTIL) (2.5 MG/3ML) 0.083% nebulizer solution Take 3 mLs (2.5 mg total) by nebulization every 4 (four) hours as needed for wheezing or shortness of breath. 75 mL 12  . atorvastatin (LIPITOR) 20 MG tablet Take 20 mg by mouth daily.    . chlorpheniramine-HYDROcodone (TUSSIONEX) 10-8 MG/5ML SUER Take 5 mLs by mouth every 12 (twelve) hours as needed for cough. 140 mL 0  . citalopram (CELEXA) 20 MG tablet Take 1 tablet (20 mg total) by mouth daily. 30 tablet 3  . dexamethasone (DECADRON) 4 MG tablet Take 1 tab two times a day the day before Alimta chemo. Take 2 tabs two times a day starting the day after chemo for 3 days. 30 tablet 1  . Fluticasone-Salmeterol (ADVAIR DISKUS) 250-50 MCG/DOSE AEPB Inhale 1 puff into the lungs 2 (two) times daily.    . folic acid (FOLVITE) 1 MG tablet Take 1 tablet (1 mg total) by mouth daily. 30 tablet 3  . hydrochlorothiazide (HYDRODIURIL) 12.5 MG tablet Take 12.5 mg by  mouth daily.    Marland Kitchen lidocaine-prilocaine (EMLA) cream Apply 1 application topically as needed. 30 g 3  . LORazepam (ATIVAN) 0.5 MG tablet Take 0.5 mg by mouth every 6 (six) hours as needed for anxiety.    Marland Kitchen losartan (COZAAR) 100 MG tablet Take 100 mg by mouth daily.    . Nebulizers (VIOS AEROSOL DELIVERY SYSTEM) MISC U UTD  0  . ondansetron (ZOFRAN) 8 MG tablet Take 1 tablet (8 mg total) by mouth 2 (two) times daily. Start the day after chemo for 3 days. Then take as needed for nausea or vomiting. 30 tablet 1  . oxyCODONE-acetaminophen (PERCOCET/ROXICET) 5-325 MG per tablet Take 1-2 tablets by mouth every 6 (six) hours as needed for moderate pain. 30 tablet 0  . rivaroxaban (XARELTO) 20 MG TABS tablet Take 20 mg by mouth daily with supper.    . VENTOLIN HFA 108 (90 BASE) MCG/ACT inhaler Inhale  1 puff into the lungs every 4 (four) hours as needed for wheezing or shortness of breath.   0  . azithromycin (ZITHROMAX) 500 MG tablet Take 1 tablet (500 mg total) by mouth daily. (Patient not taking: Reported on 08/16/2015) 5 tablet 0  . predniSONE (DELTASONE) 10 MG tablet Take 6 tabs po x 1 day, 5 tabs po x 1 day, 4 tabs po x 1 day, 3 tabs po x 1 day, 2 tabs po x 1 day, 1 tab po x 1 day, then stop (Patient not taking: Reported on 08/16/2015) 21 tablet 0   No current facility-administered medications for this visit.    OBJECTIVE: PHYSICAL EXAM:GENERAL:  Well developed, well nourished, sitting comfortably in the exam room in no acute distress.  Patient is walking with the help of cane  MENTAL STATUS:  Alert and oriented to person, place and time. HEAD.  Normocephalic, atraumatic, face symmetric, no Cushingoid features. EYES:    Pupils equal round and reactive to light and accomodation.  No conjunctivitis or scleral icterus. ENT:  Oropharynx clear without lesion.  Tongue normal. Mucous membranes moist.  RESPIRATORY:  Diminished and entry on both sides.  Emphysematous chest.  His dullness on percussion on the  left lower chest . Course crepitation with at the left lower base CARDIOVASCULAR:  Regular rate and rhythm without murmur, rub or gallop. Abdominal exam revealed normal bowel sounds. The abdomen was soft, non-tender, and without masses, organomegaly, or appreciable enlargement of the abdominal aorta. BACK:  No CVA tenderness.  No tenderness on percussion of the back or rib cage.  Intermittent tenderness in the left hip SKIN:  No rashes, ulcers or lesions. EXTREMITIES: No edema, no skin discoloration or tenderness.  No palpable cords. LYMPH NODES: No palpable cervical, supraclavicular, axillary or inguinal adenopathy  NEUROLOGICAL: She has residual weakness on the right side walking with the help of cane PSYCH:  Appropriate.very apprehensive.    Filed Vitals:   08/16/15 0924  BP: 130/76  Pulse: 100  Temp: 96.4 F (35.8 C)     Body mass index is 25.68 kg/(m^2).    ECOG FS:1 - Symptomatic but completely ambulatory  LAB RESULTS:  Appointment on 08/16/2015  Component Date Value Ref Range Status  . WBC 08/16/2015 7.2  3.8 - 10.6 K/uL Final  . RBC 08/16/2015 4.86  4.40 - 5.90 MIL/uL Final  . Hemoglobin 08/16/2015 13.4  13.0 - 18.0 g/dL Final  . HCT 08/16/2015 40.3  40.0 - 52.0 % Final  . MCV 08/16/2015 83.0  80.0 - 100.0 fL Final  . MCH 08/16/2015 27.6  26.0 - 34.0 pg Final  . MCHC 08/16/2015 33.2  32.0 - 36.0 g/dL Final  . RDW 08/16/2015 16.5* 11.5 - 14.5 % Final  . Platelets 08/16/2015 205  150 - 440 K/uL Final  . Neutrophils Relative % 08/16/2015 92   Final  . Neutro Abs 08/16/2015 6.6* 1.4 - 6.5 K/uL Final  . Lymphocytes Relative 08/16/2015 5   Final  . Lymphs Abs 08/16/2015 0.3* 1.0 - 3.6 K/uL Final  . Monocytes Relative 08/16/2015 3   Final  . Monocytes Absolute 08/16/2015 0.2  0.2 - 1.0 K/uL Final  . Eosinophils Relative 08/16/2015 0   Final  . Eosinophils Absolute 08/16/2015 0.0  0 - 0.7 K/uL Final  . Basophils Relative 08/16/2015 0   Final  . Basophils Absolute 08/16/2015  0.0  0 - 0.1 K/uL Final  . Sodium 08/16/2015 137  135 - 145 mmol/L Final  . Potassium  08/16/2015 3.8  3.5 - 5.1 mmol/L Final  . Chloride 08/16/2015 105  101 - 111 mmol/L Final  . CO2 08/16/2015 21* 22 - 32 mmol/L Final  . Glucose, Bld 08/16/2015 288* 65 - 99 mg/dL Final  . BUN 08/16/2015 22* 6 - 20 mg/dL Final  . Creatinine, Ser 08/16/2015 1.11  0.61 - 1.24 mg/dL Final  . Calcium 08/16/2015 8.2* 8.9 - 10.3 mg/dL Final  . Total Protein 08/16/2015 6.4* 6.5 - 8.1 g/dL Final  . Albumin 08/16/2015 3.1* 3.5 - 5.0 g/dL Final  . AST 08/16/2015 34  15 - 41 U/L Final  . ALT 08/16/2015 43  17 - 63 U/L Final  . Alkaline Phosphatase 08/16/2015 106  38 - 126 U/L Final  . Total Bilirubin 08/16/2015 0.7  0.3 - 1.2 mg/dL Final  . GFR calc non Af Amer 08/16/2015 >60  >60 mL/min Final  . GFR calc Af Amer 08/16/2015 >60  >60 mL/min Final   Comment: (NOTE) The eGFR has been calculated using the CKD EPI equation. This calculation has not been validated in all clinical situations. eGFR's persistently <60 mL/min signify possible Chronic Kidney Disease.   . Anion gap 08/16/2015 11  5 - 15 Final  . Magnesium 08/16/2015 2.0  1.7 - 2.4 mg/dL Final     STUDIES: Dg Chest 2 View  08/08/2015   CLINICAL DATA:  History of lung cancer. Feeling worse today which shortness of breath.  EXAM: CHEST  2 VIEW  COMPARISON:  08/01/2015  FINDINGS: Right IJ central venous catheter unchanged with tip overlying the SVC. Exam demonstrates adequate lung volumes with evidence of patient's known lung cancer over the posterior medial left midlung. Mild chronic bibasilar interstitial changes. Cardiomediastinal silhouette and remainder the exam is unchanged.  IMPRESSION: No acute findings.  Moderate focal opacification over the posterior medial left mid thorax without significant change known to represent patient's lung cancer. Stable chronic bibasilar interstitial disease.   Electronically Signed   By: Marin Olp M.D.   On: 08/08/2015  15:52   Dg Chest Port 1 View  08/01/2015   CLINICAL DATA:  Status post port insertion.  EXAM: PORTABLE CHEST - 1 VIEW  COMPARISON:  06/30/2015  FINDINGS: There has been interval placement of central venous catheter from right internal jugular approach. The tip is seen at the expected location of distal superior vena cava. There is no evidence of pneumothorax.  Cardiomediastinal silhouette is normal. Mediastinal contours appear intact.  Again seen is opacification of the left hemithorax with perihilar and lower lobe predominance. There is diffuse coarsening of the interstitial markings.  Osseous structures are without acute abnormality. Soft tissues are grossly normal.  IMPRESSION: Left lower lobe airspace consolidation, not significantly changed.  Interval placement of central venous catheter. No evidence of pneumothorax.   Electronically Signed   By: Fidela Salisbury M.D.   On: 08/01/2015 16:12   Dg C-arm 1-60 Min-no Report  08/01/2015   CLINICAL DATA: surgery   C-ARM 1-60 MINUTES  Fluoroscopy was utilized by the requesting physician.  No radiographic  interpretation.     ASSESSMENT:  Non-small cell carcinoma of lung left lower lobe.  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 Cough and shortness of breath is improved Patient had received vitamin B12 injection also taking folic acid Proceed with chemotherapy cycle 1 with carboplatinum and Alimta repeated every 3 weeks  after 3 cycles a PET scan would be done Patient will also receive XGEVA All the side effects of chemotherapy including myelosuppression, alopecia, nausea vomiting fatigue weakness.  Secondary infection, and   peripheral neuropathy .  Has been discussed in details. Informal consent has been obtained and will be documented by nurses in the chart Intent of chemotherapy is palliation and  relief in symptoms and extending survival Total duration of visit was 23mnutes.  50% or more time was spent in counseling patient and family regarding prognosis and options of treatment and available resources    Carcinoma of left lung   Staging form: Lung, AJCC 7th Edition     Clinical: Stage 4 (T4, N2.  m1) - Signed by JForest Gleason MD on 07/05/2015  JForest Gleason MD   08/16/2015 9:45 AM

## 2015-08-16 NOTE — Progress Notes (Signed)
Patient does have living will. Former smoker.

## 2015-08-18 ENCOUNTER — Encounter: Payer: Self-pay | Admitting: Internal Medicine

## 2015-08-21 ENCOUNTER — Telehealth: Payer: Self-pay | Admitting: *Deleted

## 2015-08-21 DIAGNOSIS — C3490 Malignant neoplasm of unspecified part of unspecified bronchus or lung: Secondary | ICD-10-CM

## 2015-08-21 MED ORDER — FLUCONAZOLE 100 MG PO TABS: 100.0000 mg | ORAL_TABLET | Freq: Every day | ORAL | Status: DC

## 2015-08-21 NOTE — Telephone Encounter (Signed)
Curtis Freeman now has "thrush" coating his mouth since he was not rinsing after the inhalers so would you please call in a prescription for him.  Also, he went of the Prednisone he was taking for loss of appetite because his stomach was acting up.  This was 2-3 weeks ago.  He was doing better but now the loss of appetite is back so can he go back on the Prednisone '20MG'$ ?

## 2015-08-21 NOTE — Telephone Encounter (Signed)
Pt's wife called to inform that has thrush in mouth with decreased appetite after stopping prednisone a few weeks ago. Per Dr. Oliva Bustard, pt can start diflucan '100mg'$  daily x 5 days and start back on prednisone '10mg'$  daily.

## 2015-08-23 ENCOUNTER — Inpatient Hospital Stay: Payer: Medicare Other

## 2015-08-23 DIAGNOSIS — Z5111 Encounter for antineoplastic chemotherapy: Secondary | ICD-10-CM | POA: Diagnosis not present

## 2015-08-23 DIAGNOSIS — C3492 Malignant neoplasm of unspecified part of left bronchus or lung: Secondary | ICD-10-CM

## 2015-08-23 LAB — CBC WITH DIFFERENTIAL/PLATELET
Basophils Absolute: 0 10*3/uL (ref 0–0.1)
Basophils Relative: 0 %
EOS ABS: 0 10*3/uL (ref 0–0.7)
HCT: 43.4 % (ref 40.0–52.0)
Hemoglobin: 14.4 g/dL (ref 13.0–18.0)
LYMPHS ABS: 0.3 10*3/uL — AB (ref 1.0–3.6)
Lymphocytes Relative: 6 %
MCH: 27.4 pg (ref 26.0–34.0)
MCHC: 33.2 g/dL (ref 32.0–36.0)
MCV: 82.5 fL (ref 80.0–100.0)
Monocytes Absolute: 0.1 10*3/uL — ABNORMAL LOW (ref 0.2–1.0)
Neutro Abs: 4.6 10*3/uL (ref 1.4–6.5)
Neutrophils Relative %: 92 %
PLATELETS: 75 10*3/uL — AB (ref 150–440)
RBC: 5.26 MIL/uL (ref 4.40–5.90)
RDW: 16.6 % — ABNORMAL HIGH (ref 11.5–14.5)
WBC: 5.1 10*3/uL (ref 3.8–10.6)

## 2015-08-25 ENCOUNTER — Telehealth: Payer: Self-pay

## 2015-08-25 NOTE — Telephone Encounter (Signed)
  Oncology Nurse Navigator Documentation    Navigator Encounter Type: Telephone (08/25/15 1300)                      Time Spent with Patient: 15 (08/25/15 1300)   Pt continues to report sore throat. Has taken 2 doses of Diflucan. White coating on back of tongue persists. Denies any fever/chills. Per Magda Paganini NP instructed to continue with Diflucan tablets until all 5 doses are finished and to make sure he does not rinse after using nystatin

## 2015-08-28 ENCOUNTER — Encounter: Payer: Self-pay | Admitting: Oncology

## 2015-08-28 ENCOUNTER — Inpatient Hospital Stay (HOSPITAL_BASED_OUTPATIENT_CLINIC_OR_DEPARTMENT_OTHER): Payer: Medicare Other | Admitting: Oncology

## 2015-08-28 ENCOUNTER — Telehealth: Payer: Self-pay

## 2015-08-28 ENCOUNTER — Inpatient Hospital Stay: Payer: Medicare Other

## 2015-08-28 VITALS — BP 92/60 | HR 96 | Temp 95.5°F | Wt 161.4 lb

## 2015-08-28 DIAGNOSIS — E860 Dehydration: Secondary | ICD-10-CM

## 2015-08-28 DIAGNOSIS — B370 Candidal stomatitis: Secondary | ICD-10-CM

## 2015-08-28 DIAGNOSIS — I4891 Unspecified atrial fibrillation: Secondary | ICD-10-CM

## 2015-08-28 DIAGNOSIS — C3492 Malignant neoplasm of unspecified part of left bronchus or lung: Secondary | ICD-10-CM

## 2015-08-28 DIAGNOSIS — Z8673 Personal history of transient ischemic attack (TIA), and cerebral infarction without residual deficits: Secondary | ICD-10-CM

## 2015-08-28 DIAGNOSIS — F419 Anxiety disorder, unspecified: Secondary | ICD-10-CM

## 2015-08-28 DIAGNOSIS — Z5111 Encounter for antineoplastic chemotherapy: Secondary | ICD-10-CM | POA: Diagnosis not present

## 2015-08-28 DIAGNOSIS — I10 Essential (primary) hypertension: Secondary | ICD-10-CM

## 2015-08-28 DIAGNOSIS — C7951 Secondary malignant neoplasm of bone: Secondary | ICD-10-CM | POA: Diagnosis not present

## 2015-08-28 DIAGNOSIS — R531 Weakness: Secondary | ICD-10-CM

## 2015-08-28 DIAGNOSIS — Z923 Personal history of irradiation: Secondary | ICD-10-CM

## 2015-08-28 DIAGNOSIS — E785 Hyperlipidemia, unspecified: Secondary | ICD-10-CM

## 2015-08-28 DIAGNOSIS — I959 Hypotension, unspecified: Secondary | ICD-10-CM

## 2015-08-28 DIAGNOSIS — J449 Chronic obstructive pulmonary disease, unspecified: Secondary | ICD-10-CM

## 2015-08-28 DIAGNOSIS — R5383 Other fatigue: Secondary | ICD-10-CM

## 2015-08-28 MED ORDER — FLUCONAZOLE 100MG IVPB
100.0000 mg | Freq: Once | INTRAVENOUS | Status: AC
  Administered 2015-08-28: 100 mg via INTRAVENOUS
  Filled 2015-08-28: qty 50

## 2015-08-28 MED ORDER — SODIUM CHLORIDE 0.9 % IV SOLN
Freq: Once | INTRAVENOUS | Status: AC
  Administered 2015-08-28: 14:00:00 via INTRAVENOUS
  Filled 2015-08-28: qty 1000

## 2015-08-28 MED ORDER — HEPARIN SOD (PORK) LOCK FLUSH 100 UNIT/ML IV SOLN: 500.0000 [IU] | Freq: Once | INTRAVENOUS | Status: DC

## 2015-08-28 MED ORDER — HEPARIN SOD (PORK) LOCK FLUSH 100 UNIT/ML IV SOLN
INTRAVENOUS | Status: AC
  Filled 2015-08-28: qty 5

## 2015-08-28 MED ORDER — FLUCONAZOLE IN SODIUM CHLORIDE 400-0.9 MG/200ML-% IV SOLN: 100.0000 mg | Freq: Once | INTRAVENOUS | Status: DC

## 2015-08-28 NOTE — Progress Notes (Signed)
[place] @ [facility] [phone]) [phone]  Fax:([phone]) Lawrence OB: 05-14-1941  MR#: [id number]  KVQ#:[id number]  Patient Care Team: Tracie Harrier, MD as PCP - General (Internal Medicine)  Oncology History   1.  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  Patient has finished radiation therapy to the lung mass (palliative therapy because of hemoptysis) in August of 2016 4.  Patient was started on carboplatinum and Alimta from August 16 2015  CHIEF COMPLAINT:  Chief Complaint  Patient presents with  . 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  Patient has atrial fibrillation on a long-term xeralto 2.  History of cerebrovascular accident when he was taken off of xeralto 3.  Hypercholesterolemia 4.  Hypertension INTERVAL HISTORY:  74 year old gentleman started having cough and hemoptysis a week ago was admitted in the hospital.  Patient had a CT scan which revealed left lower lobe mass.  Patient underwent bronchoscopy.  Patient was started again on 0 all toe bronchoscopy revealed left lower lobe (and lingula) mass positive for malignancy.  Non-small cell type.  Some of the biopsy of subcarinal lymph nodes were negative for malignancy.  0 all toe  patient does not have any more hemoptysis at present time.  No chest pain.  Patient had a pain in the left hip  2 weeks duration Patient was referred to me for further evaluation and management regarding carcinoma of lung In July, 2016 Patient is here for further evaluation and treatment consideration.  Continues to have left hip pain.  No nausea.  No vomiting.  That scan shows multiple metastases in bilateral lymph node as well as a bone. No hemoptysis at present time.  Patient is starting radiation therapy on Monday July 27, 2015 Patient is here for further follow-up regarding stage IV carcinoma of lung.  Patient had negative EGFR.  \ ALK NOT mutated. Ross-[id number] was not identified Assessment as finished radiation therapy Feeling weak and fatigued Received XGEVA on August 1. August 08, 2015 Patient came with a history of increasing shortness of breath poor appetite patient was on a small dose of prednisone wife to prednisone away because noticed that patient is becoming more the table.  Had a chest x-ray done.  Shortness of breath on exertion.  No nausea.  No vomiting.  No diarrhea.  Poor appetite losing weight August 16, 2015 Patient is here for ongoing evaluation and treatment consideration.  Feeling much stronger.  Cough and shortness of breath is improved.  No nausea.  No  vomiting.  No diarrhea. Patient is here to proceed with the first cycle of chemotherapy with carboplatinum and Alimta. Also for XGEVA because of history of bone metastases.  August 28, 2015 Patient came today complaining of difficulty swallowing soreness in the mouth Patient continues to have pain on the left side of the throat and difficulty swallowing.  Mildly dehydrated with low blood pressure.  Feeling week tired. According to patient with Diflucan and nystatin mouth was system was significant improvement but still has difficulty swallowing. REVIEW OF SYSTEMS:    general status: Patient is feeling weak  and tired.  No change in a performance status.  No chills.  No fever. HEENT  difficulty swallowing S [place] in history of present illness Lungs: No cough or shortness of breath Cardiac: No chest pain or paroxysmal nocturnal dyspnea GI: No nausea no vomiting no diarrhea no abdominal pain Skin: No rash Lower extremity no swelling Neurological system: No tingling.  No numbness.  No other focal signs Musculoskeletal system no bony pains  GU: Patient had a high PSA but biopsy was negative  As per HPI. Otherwise, a complete review of systems is negatve.  PAST MEDICAL HISTORY: Past Medical History  Diagnosis Date  . Hypertension   . Pneumonia   . Cancer     lung mass  . Stroke   . COPD (chronic obstructive pulmonary disease)   . Carcinoma of left lung 07/05/2015  . Anorexia 03/2016  . Stroke 03/2016    PAST SURGICAL HISTORY:  had a cataract surgery in the past.  Has a large prostate biopsy was done.   FAMILY HISTORY There is no significant family history of breast cancer, ovarian cancer, colon cancer  grandmother had stomach cancer family history of heart disease     ADVANCED DIRECTIVES:  Patient does have advance healthcare directive, Patient   does not desire to make any changes  HEALTH MAINTENANCE: Social History  Substance Use Topics  . Smoking status: Former Smoker    Quit date: 06/27/2005  . Smokeless tobacco: None  . Alcohol Use: None     Allergies  Allergen Reactions  . Celebrex [Celecoxib]   . Zolpidem Tartrate Anxiety    Current Outpatient Prescriptions  Medication Sig Dispense Refill  . albuterol (PROVENTIL) (2.5 MG/3ML) 0.083% nebulizer solution Take 3 mLs (2.5 mg total) by nebulization every 4 (four) hours as needed for wheezing or shortness of breath. 75 mL 12  . atorvastatin (LIPITOR) 20 MG tablet Take 20 mg by mouth daily.    . chlorpheniramine-HYDROcodone (TUSSIONEX) 10-8 MG/5ML SUER Take 5 mLs by mouth every 12 (twelve) hours as needed for cough. 140  mL 0  . citalopram (CELEXA) 20 MG tablet Take 1 tablet (20 mg total) by mouth daily. 30 tablet 3  . dexamethasone (DECADRON) 4 MG tablet Take 1 tab two times a day the day before Alimta chemo. Take 2 tabs two times a day starting the day after chemo for 3 days. 30 tablet 1  . fluconazole (DIFLUCAN) 100 MG tablet Take 1 tablet (100 mg total) by mouth daily. 5 tablet 0  . Fluticasone-Salmeterol (ADVAIR DISKUS) 250-50 MCG/DOSE AEPB Inhale 1 puff into the lungs 2 (two) times daily.    . folic acid (FOLVITE) 1 MG tablet Take 1 tablet (1 mg total) by mouth daily. 30 tablet 3  . hydrochlorothiazide (HYDRODIURIL) 12.5 MG tablet Take 12.5 mg by mouth daily.    Marland Kitchen lidocaine-prilocaine (EMLA) cream Apply 1 application topically as needed. 30 g 3  .  LORazepam (ATIVAN) 0.5 MG tablet Take 0.5 mg by mouth every 6 (six) hours as needed for anxiety.    Marland Kitchen losartan (COZAAR) 100 MG tablet Take 100 mg by mouth daily.    . Nebulizers (VIOS AEROSOL DELIVERY SYSTEM) MISC U UTD  0  . nystatin (MYCOSTATIN) 100000 UNIT/ML suspension Take 5 mLs by mouth 4 (four) times daily.    . ondansetron (ZOFRAN) 8 MG tablet Take 1 tablet (8 mg total) by mouth 2 (two) times daily. Start the day after chemo for 3 days. Then take as needed for nausea or vomiting. 30 tablet 1  . oxyCODONE-acetaminophen (PERCOCET/ROXICET) 5-325 MG per tablet Take 1-2 tablets by mouth every 6 (six) hours as needed for moderate pain. 30 tablet 0  . rivaroxaban (XARELTO) 20 MG TABS tablet Take 20 mg by mouth daily with supper.    . VENTOLIN HFA 108 (90 BASE) MCG/ACT inhaler Inhale 1 puff into the lungs every 4 (four) hours as needed for wheezing or shortness of breath.   0   No current facility-administered medications for this visit.   Facility-Administered Medications Ordered in Other Visits  Medication Dose Route Frequency Provider Last Rate Last Dose  . fluconazole (DIFLUCAN) IVPB 100 mg  100 mg Intravenous Once Forest Gleason, MD   100 mg at 08/28/15 1341      OBJECTIVE: PHYSICAL EXAM:GENERAL:  Well developed, well nourished, sitting comfortably in the exam room in no acute distress.  Patient is walking with the help of cane  MENTAL STATUS:  Alert and oriented to person, place and time. HEAD.  Normocephalic, atraumatic, face symmetric, no Cushingoid features. EYES:    Pupils equal round and reactive to light and accomodation.  No conjunctivitis or scleral icterus. ENT:  There is a small area of redness and candidiasis on the left side of the throat RESPIRATORY:  Diminished and entry on both sides.  Emphysematous chest.  His dullness on percussion on the left lower chest . Course crepitation with at the left lower base CARDIOVASCULAR:  Regular rate and rhythm without murmur, rub or gallop. Abdominal exam revealed normal bowel sounds. The abdomen was soft, non-tender, and without masses, organomegaly, or appreciable enlargement of the abdominal aorta. BACK:  No CVA tenderness.  No tenderness on percussion of the back or rib cage.  Intermittent tenderness in the left hip SKIN: Poor skin turgor EXTREMITIES: No edema, no skin discoloration or tenderness.  No palpable cords. LYMPH NODES: No palpable cervical, supraclavicular, axillary or inguinal adenopathy  NEUROLOGICAL: She has residual weakness on the right side walking with the help of cane PSYCH:  Appropriate.very apprehensive.    Filed Vitals:   08/28/15 1222  BP: 92/60  Pulse: 96  Temp: 95.5 F (35.3 C)     Body mass index is 23.16 kg/(m^2).    ECOG FS:1 - Symptomatic but completely ambulatory  LAB RESULTS:  No visits with results within 2 Day(s) from this visit. Latest known visit with results is:  Appointment on 08/23/2015  Component Date Value Ref Range Status  . WBC 08/23/2015 5.1  3.8 - 10.6 K/uL Final  . RBC 08/23/2015 5.26  4.40 - 5.90 MIL/uL Final  . Hemoglobin 08/23/2015 14.4  13.0 - 18.0 g/dL Final  . HCT 08/23/2015 43.4  40.0 - 52.0 % Final  . MCV 08/23/2015 82.5  80.0  - 100.0 fL Final  . MCH 08/23/2015 27.4  26.0 - 34.0 pg Final  . MCHC 08/23/2015 33.2  32.0 - 36.0 g/dL Final  . RDW  08/23/2015 16.6* 11.5 - 14.5 % Final  . Platelets 08/23/2015 75* 150 - 440 K/uL Final   RESULT REPEATED AND VERIFIED  . Neutrophils Relative % 08/23/2015 92%   Final  . Neutro Abs 08/23/2015 4.6  1.4 - 6.5 K/uL Final  . Lymphocytes Relative 08/23/2015 6%   Final  . Lymphs Abs 08/23/2015 0.3* 1.0 - 3.6 K/uL Final  . Monocytes Relative 08/23/2015 2%   Final  . Monocytes Absolute 08/23/2015 0.1* 0.2 - 1.0 K/uL Final  . Eosinophils Relative 08/23/2015 0%   Final  . Eosinophils Absolute 08/23/2015 0.0  0 - 0.7 K/uL Final  . Basophils Relative 08/23/2015 0%   Final  . Basophils Absolute 08/23/2015 0.0  0 - 0.1 K/uL Final     STUDIES: Dg Chest 2 View  08/08/2015   CLINICAL DATA:  History of lung cancer. Feeling worse today which shortness of breath.  EXAM: CHEST  2 VIEW  COMPARISON:  08/01/2015  FINDINGS: Right IJ central venous catheter unchanged with tip overlying the SVC. Exam demonstrates adequate lung volumes with evidence of patient's known lung cancer over the posterior medial left midlung. Mild chronic bibasilar interstitial changes. Cardiomediastinal silhouette and remainder the exam is unchanged.  IMPRESSION: No acute findings.  Moderate focal opacification over the posterior medial left mid thorax without significant change known to represent patient's lung cancer. Stable chronic bibasilar interstitial disease.   Electronically Signed   By: Marin Olp M.D.   On: 08/08/2015 15:52   Dg Chest Port 1 View  08/01/2015   CLINICAL DATA:  Status post port insertion.  EXAM: PORTABLE CHEST - 1 VIEW  COMPARISON:  06/30/2015  FINDINGS: There has been interval placement of central venous catheter from right internal jugular approach. The tip is seen at the expected location of distal superior vena cava. There is no evidence of pneumothorax.  Cardiomediastinal silhouette is normal.  Mediastinal contours appear intact.  Again seen is opacification of the left hemithorax with perihilar and lower lobe predominance. There is diffuse coarsening of the interstitial markings.  Osseous structures are without acute abnormality. Soft tissues are grossly normal.  IMPRESSION: Left lower lobe airspace consolidation, not significantly changed.  Interval placement of central venous catheter. No evidence of pneumothorax.   Electronically Signed   By: Fidela Salisbury M.D.   On: 08/01/2015 16:12   Dg C-arm 1-60 Min-no Report  08/01/2015   CLINICAL DATA: surgery   C-ARM 1-60 MINUTES  Fluoroscopy was utilized by the requesting physician.  No radiographic  interpretation.     ASSESSMENT:  Non-small cell carcinoma of lung left lower lobe.  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 2.  Oral candidiasis resistant to nystatin and oral Diflucan we will try IV Diflucan and Magic mouthwash If patient's condition does not improve then he supposed to give me a call.  Patient will be reevaluated in clinic to be sure that blood pressure is improved prior to discharge     Carcinoma of left lung   Staging form: Lung, AJCC 7th Edition     Clinical: Stage 4 (T4, N2.  m1) - Signed by Forest Gleason, MD on 07/05/2015  Forest Gleason, MD   08/28/2015 1:49 PM

## 2015-08-28 NOTE — Telephone Encounter (Signed)
  Oncology Nurse Navigator Documentation    Navigator Encounter Type: Telephone (08/28/15 0900)                      Time Spent with Patient: 15 (08/28/15 0900)   Pt has finished 5 days of Diflucan and now feels like he is "swallowing glass" eating and drinking very little. Dr Oliva Bustard wants to see him in the office. Schedulers will call with appt time. Curtis Freeman notified of this.

## 2015-08-28 NOTE — Progress Notes (Signed)
Patient here today as acute add on for throat pain.  Has thrust in his mouth and down his throat.  Unable to swallow very well.  BP 92/60.  Patient called on call over weekend and was prescribed nystatin.

## 2015-08-30 ENCOUNTER — Inpatient Hospital Stay: Payer: Medicare Other

## 2015-08-30 DIAGNOSIS — Z5111 Encounter for antineoplastic chemotherapy: Secondary | ICD-10-CM | POA: Diagnosis not present

## 2015-08-30 DIAGNOSIS — C3492 Malignant neoplasm of unspecified part of left bronchus or lung: Secondary | ICD-10-CM

## 2015-08-30 LAB — CBC WITH DIFFERENTIAL/PLATELET
Basophils Absolute: 0 10*3/uL (ref 0–0.1)
EOS ABS: 0 10*3/uL (ref 0–0.7)
HCT: 39.4 % — ABNORMAL LOW (ref 40.0–52.0)
HEMOGLOBIN: 13.5 g/dL (ref 13.0–18.0)
LYMPHS ABS: 0.3 10*3/uL — AB (ref 1.0–3.6)
Lymphocytes Relative: 7 %
MCH: 27.9 pg (ref 26.0–34.0)
MCHC: 34.2 g/dL (ref 32.0–36.0)
MCV: 81.7 fL (ref 80.0–100.0)
MONO ABS: 0.4 10*3/uL (ref 0.2–1.0)
Neutro Abs: 4.2 10*3/uL (ref 1.4–6.5)
Platelets: 56 10*3/uL — ABNORMAL LOW (ref 150–440)
RBC: 4.82 MIL/uL (ref 4.40–5.90)
RDW: 15.7 % — AB (ref 11.5–14.5)
WBC: 4.9 10*3/uL (ref 3.8–10.6)

## 2015-08-31 ENCOUNTER — Encounter: Payer: Self-pay | Admitting: Radiation Oncology

## 2015-08-31 ENCOUNTER — Ambulatory Visit
Admission: RE | Admit: 2015-08-31 | Discharge: 2015-08-31 | Disposition: A | Discharge status: Home / Self Care | Payer: Medicare Other | Source: Ambulatory Visit | Attending: Radiation Oncology | Admitting: Radiation Oncology

## 2015-08-31 VITALS — BP 87/61 | HR 97 | Temp 96.7°F | Resp 20

## 2015-08-31 DIAGNOSIS — C3432 Malignant neoplasm of lower lobe, left bronchus or lung: Secondary | ICD-10-CM | POA: Insufficient documentation

## 2015-08-31 DIAGNOSIS — Z923 Personal history of irradiation: Secondary | ICD-10-CM | POA: Insufficient documentation

## 2015-08-31 DIAGNOSIS — C7951 Secondary malignant neoplasm of bone: Secondary | ICD-10-CM | POA: Insufficient documentation

## 2015-08-31 MED ORDER — SUCRALFATE 1 G PO TABS: 1.0000 g | ORAL_TABLET | Freq: Three times a day (TID) | ORAL | Status: DC

## 2015-08-31 NOTE — Progress Notes (Signed)
Radiation Oncology Follow up Note  Name: Curtis Freeman   Date:   08/31/2015 MRN:  [id number] DOB: Apr 23, 1941    This 74 y.o. male presents to the clinic today for stage IV lung cancer non-small cell left lower lobe with metastasis to hip.  REFERRING PROVIDER: Lavera Guise, MD  HPI: Patient is a 74 year old male now out 1 month completing palliative radiation therapy to his lung and hip. He was initially diagnosed with non-small cell lung cancer of the left lower lobe found to have bone metastasis treated with palliative intent to his lung and hip. He is done well. He has had no further hemoptysis. No cough. Has complete resolution of pain in his hip. He has been having some problems with oral candidiasis which is been resistant to IV Diflucan. He has been given courses of IV hydration secondary to dehydration. He is being considered for continuation of chemotherapy regiment under medical oncology's direction.  COMPLICATIONS OF TREATMENT: none  FOLLOW UP COMPLIANCE: keeps appointments   PHYSICAL EXAM:  BP 87/61 mmHg  Pulse 97  Temp(Src) 96.7 F (35.9 C)  Resp 20  Wt  Well-developed wheelchair-bound male thin in NAD. Oral cavity is clear no evidence of oral candidiasis. Well-developed well-nourished patient in NAD. HEENT reveals PERLA, EOMI, discs not visualized.  Oral cavity is clear. No oral mucosal lesions are identified. Neck is clear without evidence of cervical or supraclavicular adenopathy. Lungs are clear to A&P. Cardiac examination is essentially unremarkable with regular rate and rhythm without murmur rub or thrill. Abdomen is benign with no organomegaly or masses noted. Motor sensory and DTR levels are equal and symmetric in the upper and lower extremities. Cranial nerves II through XII are grossly intact. Proprioception is intact. No peripheral adenopathy or edema is identified. No motor or sensory levels are noted. Crude visual fields are within normal range. Range of motion of  his lower extremities does not elicit pain. Motor sensory and DTR levels are equal and symmetric in the upper and lower extremities.  RADIOLOGY RESULTS: No current films for review  PLAN: Present time for palliative standpoint he is doing well. I'm starting him on some Carafate for his slight dysphasia may be associated with prior candidiasis. He continues close follow-up care with medical oncology. Further chemotherapy with Alimta, carboplatin and X Caleb Popp is planned. I would be happy to reevaluate the patient any time should further palliative treatment be indicated.  I would like to take this opportunity for allowing me to participate in the care of your patient.Armstead Peaks., MD

## 2015-09-06 ENCOUNTER — Inpatient Hospital Stay (HOSPITAL_BASED_OUTPATIENT_CLINIC_OR_DEPARTMENT_OTHER): Payer: Medicare Other | Admitting: Oncology

## 2015-09-06 ENCOUNTER — Encounter: Payer: Self-pay | Admitting: Oncology

## 2015-09-06 ENCOUNTER — Inpatient Hospital Stay: Payer: Medicare Other

## 2015-09-06 VITALS — HR 94 | Temp 95.6°F | Wt 168.1 lb

## 2015-09-06 DIAGNOSIS — Z5111 Encounter for antineoplastic chemotherapy: Secondary | ICD-10-CM | POA: Diagnosis not present

## 2015-09-06 DIAGNOSIS — R5383 Other fatigue: Secondary | ICD-10-CM

## 2015-09-06 DIAGNOSIS — F419 Anxiety disorder, unspecified: Secondary | ICD-10-CM

## 2015-09-06 DIAGNOSIS — C3492 Malignant neoplasm of unspecified part of left bronchus or lung: Secondary | ICD-10-CM

## 2015-09-06 DIAGNOSIS — R531 Weakness: Secondary | ICD-10-CM

## 2015-09-06 DIAGNOSIS — E876 Hypokalemia: Secondary | ICD-10-CM

## 2015-09-06 DIAGNOSIS — C7951 Secondary malignant neoplasm of bone: Secondary | ICD-10-CM | POA: Diagnosis not present

## 2015-09-06 DIAGNOSIS — R1310 Dysphagia, unspecified: Secondary | ICD-10-CM

## 2015-09-06 DIAGNOSIS — I4891 Unspecified atrial fibrillation: Secondary | ICD-10-CM

## 2015-09-06 DIAGNOSIS — Z8673 Personal history of transient ischemic attack (TIA), and cerebral infarction without residual deficits: Secondary | ICD-10-CM

## 2015-09-06 DIAGNOSIS — I10 Essential (primary) hypertension: Secondary | ICD-10-CM

## 2015-09-06 DIAGNOSIS — Z7901 Long term (current) use of anticoagulants: Secondary | ICD-10-CM

## 2015-09-06 DIAGNOSIS — E785 Hyperlipidemia, unspecified: Secondary | ICD-10-CM

## 2015-09-06 DIAGNOSIS — Z923 Personal history of irradiation: Secondary | ICD-10-CM

## 2015-09-06 DIAGNOSIS — C3432 Malignant neoplasm of lower lobe, left bronchus or lung: Secondary | ICD-10-CM | POA: Diagnosis not present

## 2015-09-06 DIAGNOSIS — J449 Chronic obstructive pulmonary disease, unspecified: Secondary | ICD-10-CM

## 2015-09-06 LAB — CBC WITH DIFFERENTIAL/PLATELET
BASOS PCT: 0 %
Basophils Absolute: 0 10*3/uL (ref 0–0.1)
EOS ABS: 0 10*3/uL (ref 0–0.7)
EOS PCT: 0 %
HCT: 35.3 % — ABNORMAL LOW (ref 40.0–52.0)
Hemoglobin: 12 g/dL — ABNORMAL LOW (ref 13.0–18.0)
LYMPHS ABS: 0.4 10*3/uL — AB (ref 1.0–3.6)
Lymphocytes Relative: 7 %
MCH: 27.9 pg (ref 26.0–34.0)
MCHC: 34.1 g/dL (ref 32.0–36.0)
MCV: 82 fL (ref 80.0–100.0)
MONO ABS: 0.6 10*3/uL (ref 0.2–1.0)
MONOS PCT: 11 %
NEUTROS PCT: 82 %
Neutro Abs: 4.5 10*3/uL (ref 1.4–6.5)
PLATELETS: 209 10*3/uL (ref 150–440)
RBC: 4.3 MIL/uL — ABNORMAL LOW (ref 4.40–5.90)
RDW: 16.4 % — AB (ref 11.5–14.5)
WBC: 5.6 10*3/uL (ref 3.8–10.6)

## 2015-09-06 LAB — MAGNESIUM: Magnesium: 1.7 mg/dL (ref 1.7–2.4)

## 2015-09-06 MED ORDER — SODIUM CHLORIDE 0.9 % IV SOLN
Freq: Once | INTRAVENOUS | Status: AC
  Administered 2015-09-06: 12:00:00 via INTRAVENOUS
  Filled 2015-09-06: qty 1000

## 2015-09-06 MED ORDER — SODIUM CHLORIDE 0.9 % IV SOLN
460.0000 mg | Freq: Once | INTRAVENOUS | Status: AC
  Administered 2015-09-06: 460 mg via INTRAVENOUS
  Filled 2015-09-06: qty 46

## 2015-09-06 MED ORDER — INFLUENZA VAC SPLIT QUAD 0.5 ML IM SUSY
0.5000 mL | PREFILLED_SYRINGE | Freq: Once | INTRAMUSCULAR | Status: AC
  Administered 2015-09-06: 0.5 mL via INTRAMUSCULAR
  Filled 2015-09-06: qty 0.5

## 2015-09-06 MED ORDER — SODIUM CHLORIDE 0.9 % IV SOLN
Freq: Once | INTRAVENOUS | Status: DC
  Filled 2015-09-06: qty 125

## 2015-09-06 MED ORDER — FOSAPREPITANT DIMEGLUMINE INJECTION 150 MG
Freq: Once | INTRAVENOUS | Status: AC
  Administered 2015-09-06: 12:00:00 via INTRAVENOUS
  Filled 2015-09-06: qty 5

## 2015-09-06 MED ORDER — SODIUM CHLORIDE 0.9 % IV SOLN
20.0000 meq | Freq: Once | INTRAVENOUS | Status: AC
  Administered 2015-09-06: 20 meq via INTRAVENOUS
  Filled 2015-09-06: qty 10

## 2015-09-06 MED ORDER — CYANOCOBALAMIN 1000 MCG/ML IJ SOLN
1000.0000 ug | Freq: Once | INTRAMUSCULAR | Status: AC
  Administered 2015-09-06: 1000 ug via INTRAMUSCULAR
  Filled 2015-09-06: qty 1

## 2015-09-06 MED ORDER — SODIUM CHLORIDE 0.9 % IJ SOLN
10.0000 mL | INTRAMUSCULAR | Status: DC | PRN
  Administered 2015-09-06: 10 mL
  Filled 2015-09-06: qty 10

## 2015-09-06 MED ORDER — CALCIUM 600-200 MG-UNIT PO TABS: 1.0000 | ORAL_TABLET | Freq: Two times a day (BID) | ORAL | Status: DC

## 2015-09-06 MED ORDER — PEMETREXED DISODIUM CHEMO INJECTION 500 MG
500.0000 mg/m2 | Freq: Once | INTRAVENOUS | Status: AC
  Administered 2015-09-06: 1000 mg via INTRAVENOUS
  Filled 2015-09-06: qty 40

## 2015-09-06 MED ORDER — HEPARIN SOD (PORK) LOCK FLUSH 100 UNIT/ML IV SOLN
500.0000 [IU] | Freq: Once | INTRAVENOUS | Status: AC | PRN
  Administered 2015-09-06: 500 [IU]
  Filled 2015-09-06: qty 5

## 2015-09-06 MED ORDER — PALONOSETRON HCL INJECTION 0.25 MG/5ML
0.2500 mg | Freq: Once | INTRAVENOUS | Status: AC
  Administered 2015-09-06: 0.25 mg via INTRAVENOUS
  Filled 2015-09-06: qty 5

## 2015-09-06 NOTE — Progress Notes (Signed)
Patient does have living will.  Former smoker.

## 2015-09-07 ENCOUNTER — Encounter: Payer: Self-pay | Admitting: Oncology

## 2015-09-07 NOTE — Progress Notes (Signed)
[place] @ [facility] [phone]  Fax:([phone]) [place] OB: 05/06/1941  MR#: [id number]  IOX#:[id number]  Patient Care Team: Tracie Harrier, MD as PCP - General (Internal Medicine)  Oncology History   1.  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  Patient has finished radiation therapy to the lung mass (palliative therapy because of hemoptysis) in August of 2016 4.  Patient was started on carboplatinum and Alimta from August 16 2015  CHIEF COMPLAINT:  Chief Complaint  Patient presents with  . OTHER    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    Oncology Flowsheet 06/29/2015 07/10/2015 08/01/2015 08/10/2015 08/16/2015 09/06/2015  Day, Cycle - - - - Day 1, 1 Day 1, Cycle 2  CARBOplatin (PARAPLATIN) IV - - - - 460 mg 460 mg  cyanocobalamin ((VITAMIN B-12)) IM - - - 1,000 mcg - 1,000 mcg  denosumab (XGEVA) [place] - 120 mg - - 120 mg -  dexamethasone (DECADRON) IJ - - - - - -  dexamethasone (DECADRON) IV - - - - [ 12 mg ] [ 12 mg ]  fosaprepitant (EMEND) IV - - - - [ 150 mg ] [ 150 mg ]  ondansetron (ZOFRAN) IV - - - - - -  palonosetron (ALOXI) IV - - - - 0.25 mg 0.25 mg  PEMEtrexed (ALIMTA) IV - - - - 500 mg/m2 500 mg/m2   1.  Patient has atrial fibrillation on a long-term xeralto 2.  History of cerebrovascular accident when he was taken off of xeralto 3.  Hypercholesterolemia 4.  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  Patient is gaining weight appetite is improving feeling better.  No cough no shortness of breath.  No nausea.  No vomiting.  We will  hemoptysis patient is on xerotic toe.  REVIEW OF SYSTEMS:    general status: Patient is feeling weak and tired.  No change in a performance status.  No chills.  No fever. HEENT  difficulty swallowing S Benson in history of present illness Lungs: No cough or shortness of breath Cardiac: No chest pain or paroxysmal nocturnal dyspnea GI: No nausea no vomiting no diarrhea no abdominal pain Skin: No rash Lower extremity no swelling Neurological system: No tingling.  No numbness.  No other focal signs Musculoskeletal system no bony pains  GU: Patient had a high PSA but biopsy was negative All other 12 systems have been reviewed As per HPI. Otherwise, a complete review of systems is negatve.  PAST MEDICAL HISTORY: Past Medical History  Diagnosis Date  . Hypertension   . Pneumonia   . Cancer     lung mass  . Stroke   . COPD (chronic obstructive pulmonary disease)   . Carcinoma of left lung 07/05/2015  . Anorexia 03/2016  . Stroke 03/2016    PAST SURGICAL HISTORY:  had a cataract surgery in the past.  Has a large prostate biopsy was done.   FAMILY HISTORY There is no significant family history of breast cancer, ovarian cancer, colon cancer  grandmother had stomach cancer family history  of heart disease     ADVANCED DIRECTIVES:  Patient does have advance healthcare directive, Patient   does not desire to make any changes  HEALTH MAINTENANCE: Social History  Substance Use Topics  . Smoking status: Former Smoker    Quit date: 06/27/2005  . Smokeless tobacco: None  . Alcohol Use: None     Allergies  Allergen Reactions  . Celebrex [Celecoxib]   . Zolpidem Tartrate Anxiety    Current Outpatient Prescriptions  Medication Sig Dispense Refill  . albuterol (PROVENTIL) (2.5 MG/3ML) 0.083% nebulizer solution Take 3 mLs (2.5 mg total) by nebulization every 4 (four) hours as needed for wheezing or shortness of breath. 75 mL 12  . atorvastatin (LIPITOR) 20 MG tablet Take 20 mg by  mouth daily.    . chlorpheniramine-HYDROcodone (TUSSIONEX) 10-8 MG/5ML SUER Take 5 mLs by mouth every 12 (twelve) hours as needed for cough. 140 mL 0  . citalopram (CELEXA) 20 MG tablet Take 1 tablet (20 mg total) by mouth daily. 30 tablet 3  . dexamethasone (DECADRON) 4 MG tablet Take 1 tab two times a day the day before Alimta chemo. Take 2 tabs two times a day starting the day after chemo for 3 days. 30 tablet 1  . fluconazole (DIFLUCAN) 100 MG tablet Take 1 tablet (100 mg total) by mouth daily. 5 tablet 0  . Fluticasone-Salmeterol (ADVAIR DISKUS) 250-50 MCG/DOSE AEPB Inhale 1 puff into the lungs 2 (two) times daily.    . folic acid (FOLVITE) 1 MG tablet Take 1 tablet (1 mg total) by mouth daily. 30 tablet 3  . hydrochlorothiazide (HYDRODIURIL) 12.5 MG tablet Take 12.5 mg by mouth daily.    Marland Kitchen lidocaine-prilocaine (EMLA) cream Apply 1 application topically as needed. 30 g 3  . LORazepam (ATIVAN) 0.5 MG tablet Take 0.5 mg by mouth every 6 (six) hours as needed for anxiety.    Marland Kitchen losartan (COZAAR) 100 MG tablet Take 100 mg by mouth daily.    . Nebulizers (VIOS AEROSOL DELIVERY SYSTEM) MISC U UTD  0  . nystatin (MYCOSTATIN) 100000 UNIT/ML suspension Take 5 mLs by mouth 4 (four) times daily.    . ondansetron (ZOFRAN) 8 MG tablet Take 1 tablet (8 mg total) by mouth 2 (two) times daily. Start the day after chemo for 3 days. Then take as needed for nausea or vomiting. 30 tablet 1  . oxyCODONE-acetaminophen (PERCOCET/ROXICET) 5-325 MG per tablet Take 1-2 tablets by mouth every 6 (six) hours as needed for moderate pain. 30 tablet 0  . predniSONE (DELTASONE) 10 MG tablet   0  . rivaroxaban (XARELTO) 20 MG TABS tablet Take 20 mg by mouth daily with supper.    . sucralfate (CARAFATE) 1 G tablet Take 1 tablet (1 g total) by mouth 4 (four) times daily -  with meals and at bedtime. 90 tablet 2  . VENTOLIN HFA 108 (90 BASE) MCG/ACT inhaler Inhale 1 puff into the lungs every 4 (four) hours as needed for wheezing  or shortness of breath.   0  . Calcium 600-200 MG-UNIT tablet Take 1 tablet by mouth 2 (two) times daily. 60 tablet 3   No current facility-administered medications for this visit.    OBJECTIVE: PHYSICAL EXAM:GENERAL:  Well developed, well nourished, sitting comfortably in the exam room in no acute distress.  Patient is walking with the help of cane  MENTAL STATUS:  Alert and oriented to person, place and time. HEAD.  Normocephalic, atraumatic, face symmetric, no Cushingoid features. EYES:  Pupils equal round and reactive to light and accomodation.  No conjunctivitis or scleral icterus. ENT:  There is a small area of redness and candidiasis on the left side of the throat RESPIRATORY:  Diminished and entry on both sides.  Emphysematous chest.  His dullness on percussion on the left lower chest . Course crepitation with at the left lower base CARDIOVASCULAR:  Regular rate and rhythm without murmur, rub or gallop. Abdominal exam revealed normal bowel sounds. The abdomen was soft, non-tender, and without masses, organomegaly, or appreciable enlargement of the abdominal aorta. BACK:  No CVA tenderness.  No tenderness on percussion of the back or rib cage.  Intermittent tenderness in the left hip SKIN: Poor skin turgor EXTREMITIES: No edema, no skin discoloration or tenderness.  No palpable cords. LYMPH NODES: No palpable cervical, supraclavicular, axillary or inguinal adenopathy  NEUROLOGICAL: She has residual weakness on the right side walking with the help of cane PSYCH:  Appropriate.very apprehensive.    Filed Vitals:   09/06/15 1039  Pulse: 94  Temp: 95.6 F (35.3 C)     Body mass index is 24.12 kg/(m^2).    ECOG FS:1 - Symptomatic but completely ambulatory  LAB RESULTS:  Infusion on 09/06/2015  Component Date Value Ref Range Status  . WBC 09/06/2015 5.6  3.8 - 10.6 K/uL Final  . RBC 09/06/2015 4.30* 4.40 - 5.90 MIL/uL Final  . Hemoglobin 09/06/2015 12.0* 13.0 - 18.0 g/dL Final    . HCT 09/06/2015 35.3* 40.0 - 52.0 % Final  . MCV 09/06/2015 82.0  80.0 - 100.0 fL Final  . MCH 09/06/2015 27.9  26.0 - 34.0 pg Final  . MCHC 09/06/2015 34.1  32.0 - 36.0 g/dL Final  . RDW 09/06/2015 16.4* 11.5 - 14.5 % Final  . Platelets 09/06/2015 209  150 - 440 K/uL Final  . Neutrophils Relative % 09/06/2015 82   Final  . Neutro Abs 09/06/2015 4.5  1.4 - 6.5 K/uL Final  . Lymphocytes Relative 09/06/2015 7   Final  . Lymphs Abs 09/06/2015 0.4* 1.0 - 3.6 K/uL Final  . Monocytes Relative 09/06/2015 11   Final  . Monocytes Absolute 09/06/2015 0.6  0.2 - 1.0 K/uL Final  . Eosinophils Relative 09/06/2015 0   Final  . Eosinophils Absolute 09/06/2015 0.0  0 - 0.7 K/uL Final  . Basophils Relative 09/06/2015 0   Final  . Basophils Absolute 09/06/2015 0.0  0 - 0.1 K/uL Final  . Sodium 09/06/2015 132* 135 - 145 mmol/L Final  . Potassium 09/06/2015 2.9* 3.5 - 5.1 mmol/L Final   Comment: READ BACK AND VERIFIED CRITICAL RESULT CALLED TO, READ BACK BY AND VERIFIED WITH HAYLEY RHODE AT 0957 09/06/2015 BY KMR   . Chloride 09/06/2015 99* 101 - 111 mmol/L Final  . CO2 09/06/2015 27  22 - 32 mmol/L Final  . Glucose, Bld 09/06/2015 186* 65 - 99 mg/dL Final  . BUN 09/06/2015 29* 6 - 20 mg/dL Final  . Creatinine, Ser 09/06/2015 0.97  0.61 - 1.24 mg/dL Final  . Calcium 09/06/2015 8.1* 8.9 - 10.3 mg/dL Final  . Total Protein 09/06/2015 5.9* 6.5 - 8.1 g/dL Final  . Albumin 09/06/2015 3.0* 3.5 - 5.0 g/dL Final  . AST 09/06/2015 38  15 - 41 U/L Final  . ALT 09/06/2015 63  17 - 63 U/L Final  . Alkaline Phosphatase 09/06/2015 126  38 - 126 U/L Final  . Total Bilirubin 09/06/2015 0.4  0.3 - 1.2 mg/dL Final  . GFR calc non  Af Amer 09/06/2015 >60  >60 mL/min Final  . GFR calc Af Amer 09/06/2015 >60  >60 mL/min Final   Comment: (NOTE) The eGFR has been calculated using the CKD EPI equation. This calculation has not been validated in all clinical situations. eGFR's persistently <60 mL/min signify possible  Chronic Kidney Disease.   . Anion gap 09/06/2015 6  5 - 15 Final  . Magnesium 09/06/2015 1.7  1.7 - 2.4 mg/dL Final      ASSESSMENT:  Non-small cell carcinoma of lung left lower lobe.  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 Continue second cycle of chemotherapy patient will receive vitamin B12 injection  2.  Hypokalemia patient will get intravenous potassium. Patient was started oral potassium 3.  EGFR and other molecular markers are pending 4.  Calcium is within normal limits patient will receive   xgeva  next week Calcium tablet will be added Significant improvement in patient's performance status 5.  Atrial fibrillation patient is   XERALTO which will be continued   Carcinoma of left lung   Staging form: Lung, AJCC 7th Edition     Clinical: Stage 4 (T4, N2.  m1) - Signed by Forest Gleason, MD on 07/05/2015  Forest Gleason, MD   09/07/2015 5:01 PM

## 2015-09-08 LAB — COMPREHENSIVE METABOLIC PANEL
ALBUMIN: 3 g/dL — AB (ref 3.5–5.0)
ALK PHOS: 126 U/L (ref 38–126)
ALT: 63 U/L (ref 17–63)
ANION GAP: 6 (ref 5–15)
AST: 38 U/L (ref 15–41)
BUN: 29 mg/dL — ABNORMAL HIGH (ref 6–20)
CALCIUM: 8.1 mg/dL — AB (ref 8.9–10.3)
CHLORIDE: 99 mmol/L — AB (ref 101–111)
CO2: 27 mmol/L (ref 22–32)
Creatinine, Ser: 0.97 mg/dL (ref 0.61–1.24)
GFR calc non Af Amer: >60 mL/min (ref >60)
GLUCOSE: 186 mg/dL — AB (ref 65–99)
POTASSIUM: 2.9 mmol/L — AB (ref 3.5–5.1)
SODIUM: 132 mmol/L — AB (ref 135–145)
Total Bilirubin: 0.4 mg/dL (ref 0.3–1.2)
Total Protein: 5.9 g/dL — ABNORMAL LOW (ref 6.5–8.1)

## 2015-09-13 ENCOUNTER — Telehealth: Payer: Self-pay

## 2015-09-13 ENCOUNTER — Inpatient Hospital Stay: Payer: Medicare Other | Attending: Oncology | Admitting: *Deleted

## 2015-09-13 ENCOUNTER — Inpatient Hospital Stay: Payer: Medicare Other

## 2015-09-13 VITALS — BP 101/67 | HR 98 | Temp 97.0°F | Resp 20

## 2015-09-13 DIAGNOSIS — C3432 Malignant neoplasm of lower lobe, left bronchus or lung: Secondary | ICD-10-CM | POA: Diagnosis not present

## 2015-09-13 DIAGNOSIS — I4892 Unspecified atrial flutter: Secondary | ICD-10-CM | POA: Diagnosis not present

## 2015-09-13 DIAGNOSIS — J90 Pleural effusion, not elsewhere classified: Secondary | ICD-10-CM | POA: Insufficient documentation

## 2015-09-13 DIAGNOSIS — Z5111 Encounter for antineoplastic chemotherapy: Secondary | ICD-10-CM | POA: Insufficient documentation

## 2015-09-13 DIAGNOSIS — E7800 Pure hypercholesterolemia, unspecified: Secondary | ICD-10-CM | POA: Insufficient documentation

## 2015-09-13 DIAGNOSIS — R0602 Shortness of breath: Secondary | ICD-10-CM | POA: Diagnosis not present

## 2015-09-13 DIAGNOSIS — J449 Chronic obstructive pulmonary disease, unspecified: Secondary | ICD-10-CM | POA: Diagnosis not present

## 2015-09-13 DIAGNOSIS — Z79899 Other long term (current) drug therapy: Secondary | ICD-10-CM | POA: Diagnosis not present

## 2015-09-13 DIAGNOSIS — Z9181 History of falling: Secondary | ICD-10-CM | POA: Insufficient documentation

## 2015-09-13 DIAGNOSIS — Z7901 Long term (current) use of anticoagulants: Secondary | ICD-10-CM | POA: Insufficient documentation

## 2015-09-13 DIAGNOSIS — R531 Weakness: Secondary | ICD-10-CM | POA: Insufficient documentation

## 2015-09-13 DIAGNOSIS — Z923 Personal history of irradiation: Secondary | ICD-10-CM | POA: Diagnosis not present

## 2015-09-13 DIAGNOSIS — I4891 Unspecified atrial fibrillation: Secondary | ICD-10-CM | POA: Diagnosis not present

## 2015-09-13 DIAGNOSIS — C7951 Secondary malignant neoplasm of bone: Secondary | ICD-10-CM | POA: Diagnosis not present

## 2015-09-13 DIAGNOSIS — R5383 Other fatigue: Secondary | ICD-10-CM | POA: Insufficient documentation

## 2015-09-13 DIAGNOSIS — Z8673 Personal history of transient ischemic attack (TIA), and cerebral infarction without residual deficits: Secondary | ICD-10-CM | POA: Diagnosis not present

## 2015-09-13 DIAGNOSIS — Z87891 Personal history of nicotine dependence: Secondary | ICD-10-CM | POA: Diagnosis not present

## 2015-09-13 DIAGNOSIS — C3492 Malignant neoplasm of unspecified part of left bronchus or lung: Secondary | ICD-10-CM

## 2015-09-13 DIAGNOSIS — R600 Localized edema: Secondary | ICD-10-CM | POA: Insufficient documentation

## 2015-09-13 DIAGNOSIS — R5381 Other malaise: Secondary | ICD-10-CM | POA: Diagnosis not present

## 2015-09-13 DIAGNOSIS — I10 Essential (primary) hypertension: Secondary | ICD-10-CM | POA: Insufficient documentation

## 2015-09-13 LAB — CBC WITH DIFFERENTIAL/PLATELET
BASOS PCT: 0 %
Basophils Absolute: 0 10*3/uL (ref 0–0.1)
Eosinophils Absolute: 0 10*3/uL (ref 0–0.7)
Eosinophils Relative: 0 %
HEMATOCRIT: 37.7 % — AB (ref 40.0–52.0)
HEMOGLOBIN: 12.7 g/dL — AB (ref 13.0–18.0)
LYMPHS ABS: 0.5 10*3/uL — AB (ref 1.0–3.6)
Lymphocytes Relative: 17 %
MCH: 28 pg (ref 26.0–34.0)
MCHC: 33.5 g/dL (ref 32.0–36.0)
MCV: 83.7 fL (ref 80.0–100.0)
MONO ABS: 0.1 10*3/uL — AB (ref 0.2–1.0)
MONOS PCT: 3 %
NEUTROS ABS: 2.3 10*3/uL (ref 1.4–6.5)
Neutrophils Relative %: 80 %
Platelets: 66 10*3/uL — ABNORMAL LOW (ref 150–440)
RBC: 4.51 MIL/uL (ref 4.40–5.90)
RDW: 17.2 % — AB (ref 11.5–14.5)
WBC: 2.9 10*3/uL — ABNORMAL LOW (ref 3.8–10.6)

## 2015-09-13 LAB — CALCIUM: Calcium: 7.5 mg/dL — ABNORMAL LOW (ref 8.9–10.3)

## 2015-09-13 MED ORDER — DENOSUMAB 120 MG/1.7ML ~~LOC~~ SOLN
120.0000 mg | Freq: Once | SUBCUTANEOUS | Status: AC
  Administered 2015-09-13: 120 mg via SUBCUTANEOUS
  Filled 2015-09-13: qty 1.7

## 2015-09-13 NOTE — Telephone Encounter (Signed)
Calcium 7.5, corrected calcium 8.3

## 2015-09-20 ENCOUNTER — Inpatient Hospital Stay: Payer: Medicare Other

## 2015-09-20 DIAGNOSIS — Z5111 Encounter for antineoplastic chemotherapy: Secondary | ICD-10-CM | POA: Diagnosis not present

## 2015-09-20 DIAGNOSIS — C3492 Malignant neoplasm of unspecified part of left bronchus or lung: Secondary | ICD-10-CM

## 2015-09-20 LAB — CBC WITH DIFFERENTIAL/PLATELET
BASOS ABS: 0 10*3/uL (ref 0–0.1)
BASOS PCT: 0 %
EOS PCT: 0 %
Eosinophils Absolute: 0 10*3/uL (ref 0–0.7)
HCT: 32.6 % — ABNORMAL LOW (ref 40.0–52.0)
Hemoglobin: 10.9 g/dL — ABNORMAL LOW (ref 13.0–18.0)
Lymphocytes Relative: 13 %
Lymphs Abs: 0.6 10*3/uL — ABNORMAL LOW (ref 1.0–3.6)
MCH: 27.9 pg (ref 26.0–34.0)
MCHC: 33.5 g/dL (ref 32.0–36.0)
MCV: 83.4 fL (ref 80.0–100.0)
MONO ABS: 0.5 10*3/uL (ref 0.2–1.0)
Monocytes Relative: 11 %
NEUTROS ABS: 3.6 10*3/uL (ref 1.4–6.5)
Neutrophils Relative %: 76 %
Platelets: 59 10*3/uL — ABNORMAL LOW (ref 150–440)
RBC: 3.91 MIL/uL — AB (ref 4.40–5.90)
RDW: 17.2 % — AB (ref 11.5–14.5)
WBC: 4.7 10*3/uL (ref 3.8–10.6)

## 2015-09-27 ENCOUNTER — Inpatient Hospital Stay: Payer: Medicare Other

## 2015-09-27 ENCOUNTER — Inpatient Hospital Stay (HOSPITAL_BASED_OUTPATIENT_CLINIC_OR_DEPARTMENT_OTHER): Payer: Medicare Other | Admitting: Oncology

## 2015-09-27 ENCOUNTER — Encounter: Payer: Self-pay | Admitting: Oncology

## 2015-09-27 ENCOUNTER — Ambulatory Visit
Admission: RE | Admit: 2015-09-27 | Discharge: 2015-09-27 | Disposition: A | Discharge status: Home / Self Care | Payer: Medicare Other | Source: Ambulatory Visit | Attending: Oncology | Admitting: Oncology

## 2015-09-27 VITALS — BP 107/70 | HR 109 | Temp 96.3°F | Wt 172.0 lb

## 2015-09-27 DIAGNOSIS — I517 Cardiomegaly: Secondary | ICD-10-CM | POA: Insufficient documentation

## 2015-09-27 DIAGNOSIS — C3492 Malignant neoplasm of unspecified part of left bronchus or lung: Secondary | ICD-10-CM

## 2015-09-27 DIAGNOSIS — C7951 Secondary malignant neoplasm of bone: Secondary | ICD-10-CM | POA: Diagnosis not present

## 2015-09-27 DIAGNOSIS — J449 Chronic obstructive pulmonary disease, unspecified: Secondary | ICD-10-CM

## 2015-09-27 DIAGNOSIS — R5381 Other malaise: Secondary | ICD-10-CM

## 2015-09-27 DIAGNOSIS — Z87891 Personal history of nicotine dependence: Secondary | ICD-10-CM

## 2015-09-27 DIAGNOSIS — R0602 Shortness of breath: Secondary | ICD-10-CM | POA: Diagnosis not present

## 2015-09-27 DIAGNOSIS — I10 Essential (primary) hypertension: Secondary | ICD-10-CM

## 2015-09-27 DIAGNOSIS — R918 Other nonspecific abnormal finding of lung field: Secondary | ICD-10-CM | POA: Diagnosis not present

## 2015-09-27 DIAGNOSIS — C3432 Malignant neoplasm of lower lobe, left bronchus or lung: Secondary | ICD-10-CM

## 2015-09-27 DIAGNOSIS — I4891 Unspecified atrial fibrillation: Secondary | ICD-10-CM

## 2015-09-27 DIAGNOSIS — Z923 Personal history of irradiation: Secondary | ICD-10-CM | POA: Diagnosis not present

## 2015-09-27 DIAGNOSIS — Z5111 Encounter for antineoplastic chemotherapy: Secondary | ICD-10-CM | POA: Diagnosis not present

## 2015-09-27 DIAGNOSIS — R531 Weakness: Secondary | ICD-10-CM

## 2015-09-27 DIAGNOSIS — R600 Localized edema: Secondary | ICD-10-CM

## 2015-09-27 DIAGNOSIS — Z7901 Long term (current) use of anticoagulants: Secondary | ICD-10-CM

## 2015-09-27 LAB — MAGNESIUM: Magnesium: 1.9 mg/dL (ref 1.7–2.4)

## 2015-09-27 LAB — CBC WITH DIFFERENTIAL/PLATELET
Basophils Absolute: 0 10*3/uL (ref 0–0.1)
Basophils Relative: 0 %
EOS ABS: 0 10*3/uL (ref 0–0.7)
Eosinophils Relative: 0 %
HEMATOCRIT: 28.4 % — AB (ref 40.0–52.0)
HEMOGLOBIN: 9.5 g/dL — AB (ref 13.0–18.0)
LYMPHS ABS: 0.6 10*3/uL — AB (ref 1.0–3.6)
Lymphocytes Relative: 7 %
MCH: 28.7 pg (ref 26.0–34.0)
MCHC: 33.7 g/dL (ref 32.0–36.0)
MCV: 85.1 fL (ref 80.0–100.0)
MONOS PCT: 9 %
Monocytes Absolute: 0.8 10*3/uL (ref 0.2–1.0)
NEUTROS ABS: 7.2 10*3/uL — AB (ref 1.4–6.5)
NEUTROS PCT: 84 %
Platelets: 235 10*3/uL (ref 150–440)
RBC: 3.33 MIL/uL — AB (ref 4.40–5.90)
RDW: 19.1 % — ABNORMAL HIGH (ref 11.5–14.5)
WBC: 8.7 10*3/uL (ref 4.0–10.5)

## 2015-09-27 LAB — COMPREHENSIVE METABOLIC PANEL
ALBUMIN: 2.6 g/dL — AB (ref 3.5–5.0)
ALK PHOS: 102 U/L (ref 38–126)
ALT: 43 U/L (ref 17–63)
ANION GAP: 7 (ref 5–15)
AST: 35 U/L (ref 15–41)
BILIRUBIN TOTAL: 0.6 mg/dL (ref 0.3–1.2)
BUN: 18 mg/dL (ref 6–20)
CALCIUM: 7.3 mg/dL — AB (ref 8.9–10.3)
CO2: 25 mmol/L (ref 22–32)
CREATININE: 1.03 mg/dL (ref 0.61–1.24)
Chloride: 100 mmol/L — ABNORMAL LOW (ref 101–111)
GFR calc non Af Amer: >60 mL/min (ref >60)
GLUCOSE: 327 mg/dL — AB (ref 65–99)
Potassium: 3.7 mmol/L (ref 3.5–5.1)
SODIUM: 132 mmol/L — AB (ref 135–145)
TOTAL PROTEIN: 5.6 g/dL — AB (ref 6.5–8.1)

## 2015-09-27 MED ORDER — SODIUM CHLORIDE 0.9 % IV SOLN
500.0000 mg/m2 | Freq: Once | INTRAVENOUS | Status: AC
  Administered 2015-09-27: 1000 mg via INTRAVENOUS
  Filled 2015-09-27: qty 40

## 2015-09-27 MED ORDER — SODIUM CHLORIDE 0.9 % IJ SOLN
10.0000 mL | INTRAMUSCULAR | Status: DC | PRN
  Administered 2015-09-27: 10 mL via INTRAVENOUS
  Filled 2015-09-27: qty 10

## 2015-09-27 MED ORDER — SODIUM CHLORIDE 0.9 % IJ SOLN
10.0000 mL | INTRAMUSCULAR | Status: DC | PRN
  Filled 2015-09-27: qty 10

## 2015-09-27 MED ORDER — SODIUM CHLORIDE 0.9 % IV SOLN
Freq: Once | INTRAVENOUS | Status: AC
  Administered 2015-09-27: 10:00:00 via INTRAVENOUS
  Filled 2015-09-27: qty 1000

## 2015-09-27 MED ORDER — FOSAPREPITANT DIMEGLUMINE INJECTION 150 MG
Freq: Once | INTRAVENOUS | Status: AC
  Administered 2015-09-27: 10:00:00 via INTRAVENOUS
  Filled 2015-09-27: qty 5

## 2015-09-27 MED ORDER — HEPARIN SOD (PORK) LOCK FLUSH 100 UNIT/ML IV SOLN
500.0000 [IU] | Freq: Once | INTRAVENOUS | Status: AC
  Administered 2015-09-27: 500 [IU] via INTRAVENOUS
  Filled 2015-09-27: qty 5

## 2015-09-27 MED ORDER — HEPARIN SOD (PORK) LOCK FLUSH 100 UNIT/ML IV SOLN: 500.0000 [IU] | Freq: Once | INTRAVENOUS | Status: DC | PRN

## 2015-09-27 MED ORDER — LEVOFLOXACIN 500 MG PO TABS: 500.0000 mg | ORAL_TABLET | Freq: Every day | ORAL | Status: DC

## 2015-09-27 MED ORDER — SODIUM CHLORIDE 0.9 % IV SOLN
460.0000 mg | Freq: Once | INTRAVENOUS | Status: AC
  Administered 2015-09-27: 460 mg via INTRAVENOUS
  Filled 2015-09-27: qty 46

## 2015-09-27 MED ORDER — PALONOSETRON HCL INJECTION 0.25 MG/5ML
0.2500 mg | Freq: Once | INTRAVENOUS | Status: AC
  Administered 2015-09-27: 0.25 mg via INTRAVENOUS
  Filled 2015-09-27: qty 5

## 2015-09-27 MED ORDER — CYANOCOBALAMIN 1000 MCG/ML IJ SOLN
1000.0000 ug | Freq: Once | INTRAMUSCULAR | Status: AC
Start: 2015-09-27 — End: 2015-09-27
  Administered 2015-09-27: 1000 ug via INTRAMUSCULAR
  Filled 2015-09-27: qty 1

## 2015-09-27 MED ORDER — FUROSEMIDE 40 MG PO TABS: 40.0000 mg | ORAL_TABLET | Freq: Every day | ORAL | Status: DC

## 2015-09-27 NOTE — Progress Notes (Signed)
Called pt to inform him of chest xray results. Message left on voicemail and instructed pt to pick up rx for levaquin that has been called into pharmacy in addition to lasix that was called in earlier today. Informed pt to call back if has any further questions.

## 2015-09-27 NOTE — Progress Notes (Signed)
[place] @ [facility] [phone]  Fax:(336) [place] OB: 15-Jan-1941  MR#: [id number]  GDJ#:[id number]  Patient Care Team: Tracie Harrier, MD as PCP - General (Internal Medicine)  Oncology History   1.  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  Patient has finished radiation therapy to the lung mass (palliative therapy because of hemoptysis) in August of 2016 4.  Patient was started on carboplatinum and Alimta from August 16 2015  CHIEF COMPLAINT:  Chief Complaint  Patient presents with  . Lung Cancer    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    Oncology Flowsheet 07/10/2015 08/01/2015 08/10/2015 08/16/2015 09/06/2015 09/13/2015 09/27/2015  Day, Cycle - - - Day 1, 1 Day 1, Cycle 2 - Day 1, Cycle 3  CARBOplatin (PARAPLATIN) IV - - - 460 mg 460 mg - 460 mg  cyanocobalamin ((VITAMIN B-12)) IM - - 1,000 mcg - 1,000 mcg - 1,000 mcg  denosumab (XGEVA) [place] 120 mg - - 120 mg - 120 mg -  dexamethasone (DECADRON) IJ - - - - - - -  dexamethasone (DECADRON) IV - - - [ 12 mg ] [ 12 mg ] - [ 12 mg ]  fosaprepitant (EMEND) IV - - - [ 150 mg ] [ 150 mg ] - [ 150 mg ]  ondansetron (ZOFRAN) IV - - - - - - -  palonosetron (ALOXI) IV - - - 0.25 mg 0.25 mg - 0.25 mg  PEMEtrexed (ALIMTA) IV - - - 500 mg/m2 500 mg/m2 - 500 mg/m2   1.  Patient has atrial fibrillation on a long-term xeralto 2.  History of cerebrovascular accident when he was taken off of xeralto 3.  Hypercholesterolemia 4.  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  According to him he tipped over missed a step and fell.  Had ecchymosis on the face which is  resolving. Continues to shortness of breath on exertion But oxygen saturation is 99% Appetite has been improving. Here for further follow-up also noticed swelling of lower extremity.  REVIEW OF SYSTEMS:    general status: Patient is feeling weak and tired.  No change in a performance status.  No chills.  No fever. HEENT  difficulty swallowing S Benson in history of present illness Lungs: No cough or shortness of breath Cardiac: No chest pain or paroxysmal nocturnal dyspnea GI: No nausea no vomiting no diarrhea no abdominal pain Skin: No rash Lower extremity no swelling Neurological system: No tingling.  No numbness.  No other focal signs Musculoskeletal system no bony pains  GU: Patient had a high PSA but biopsy was negative All other 12 systems have been reviewed As per HPI. Otherwise, a complete review of systems is negatve.  PAST MEDICAL HISTORY: Past Medical History  Diagnosis Date  . Hypertension   . Pneumonia   . Cancer (HCC)     lung mass  . Stroke ([place])   . COPD (chronic obstructive pulmonary disease) ([place])   . Carcinoma of left lung ([place]) 07/05/2015  . Anorexia 03/2016  . Stroke [facility]) 03/2016    PAST SURGICAL HISTORY:  had a cataract surgery  in the past.  Has a large prostate biopsy was done.   FAMILY HISTORY There is no significant family history of breast cancer, ovarian cancer, colon cancer  grandmother had stomach cancer family history of heart disease     ADVANCED DIRECTIVES:  Patient does have advance healthcare directive, Patient   does not desire to make any changes  HEALTH MAINTENANCE: Social History  Substance Use Topics  . Smoking status: Former Smoker    Types: Cigarettes    Quit date: 06/27/2005  . Smokeless tobacco: None  . Alcohol Use: No     Allergies  Allergen Reactions  . Celebrex [Celecoxib]   . Zolpidem Tartrate Anxiety    Current Outpatient Prescriptions  Medication Sig Dispense Refill  . albuterol (PROVENTIL) (2.5 MG/3ML)  0.083% nebulizer solution Take 3 mLs (2.5 mg total) by nebulization every 4 (four) hours as needed for wheezing or shortness of breath. 75 mL 12  . atorvastatin (LIPITOR) 20 MG tablet Take 20 mg by mouth daily.    . Calcium 600-200 MG-UNIT tablet Take 1 tablet by mouth 2 (two) times daily. 60 tablet 3  . chlorpheniramine-HYDROcodone (TUSSIONEX) 10-8 MG/5ML SUER Take 5 mLs by mouth every 12 (twelve) hours as needed for cough. 140 mL 0  . citalopram (CELEXA) 20 MG tablet Take 1 tablet (20 mg total) by mouth daily. 30 tablet 3  . dexamethasone (DECADRON) 4 MG tablet Take 1 tab two times a day the day before Alimta chemo. Take 2 tabs two times a day starting the day after chemo for 3 days. 30 tablet 1  . fluconazole (DIFLUCAN) 100 MG tablet Take 1 tablet (100 mg total) by mouth daily. 5 tablet 0  . Fluticasone-Salmeterol (ADVAIR DISKUS) 250-50 MCG/DOSE AEPB Inhale 1 puff into the lungs 2 (two) times daily.    . folic acid (FOLVITE) 1 MG tablet Take 1 tablet (1 mg total) by mouth daily. 30 tablet 3  . lidocaine-prilocaine (EMLA) cream Apply 1 application topically as needed. 30 g 3  . LORazepam (ATIVAN) 0.5 MG tablet Take 0.5 mg by mouth every 6 (six) hours as needed for anxiety.    Marland Kitchen losartan (COZAAR) 100 MG tablet Take 100 mg by mouth daily.    . Nebulizers (VIOS AEROSOL DELIVERY SYSTEM) MISC U UTD  0  . nystatin (MYCOSTATIN) 100000 UNIT/ML suspension Take 5 mLs by mouth 4 (four) times daily.    . ondansetron (ZOFRAN) 8 MG tablet Take 1 tablet (8 mg total) by mouth 2 (two) times daily. Start the day after chemo for 3 days. Then take as needed for nausea or vomiting. 30 tablet 1  . oxyCODONE-acetaminophen (PERCOCET/ROXICET) 5-325 MG per tablet Take 1-2 tablets by mouth every 6 (six) hours as needed for moderate pain. 30 tablet 0  . predniSONE (DELTASONE) 10 MG tablet   0  . rivaroxaban (XARELTO) 20 MG TABS tablet Take 20 mg by mouth daily with supper.    . sucralfate (CARAFATE) 1 G tablet Take 1 tablet  (1 g total) by mouth 4 (four) times daily -  with meals and at bedtime. 90 tablet 2  . VENTOLIN HFA 108 (90 BASE) MCG/ACT inhaler Inhale 1 puff into the lungs every 4 (four) hours as needed for wheezing or shortness of breath.   0  . furosemide (LASIX) 40 MG tablet Take 1 tablet (40 mg total) by mouth daily. 30 tablet 3  . hydrochlorothiazide (HYDRODIURIL) 12.5 MG tablet   2  . levofloxacin (LEVAQUIN) 500 MG tablet Take 1  tablet (500 mg total) by mouth daily. 7 tablet 0   No current facility-administered medications for this visit.    OBJECTIVE: PHYSICAL EXAM:GENERAL:  Well developed, well nourished, sitting comfortably in the exam room in no acute distress.  Patient is walking with the help of cane  MENTAL STATUS:  Alert and oriented to person, place and time. HEAD.  Ecchymosis which is resolving on the face. EYES:    Pupils equal round and reactive to light and accomodation.  No conjunctivitis or scleral icterus. ENT:  There is a small area of redness and candidiasis on the left side of the throat RESPIRATORY:  Diminished and entry on both sides.  Emphysematous chest.  His dullness on percussion on the left lower chest .  Diminished air entry on the right side Course crepitation with at the left lower base CARDIOVASCULAR:  Regular rate and rhythm without murmur, rub or gallop. Abdominal exam revealed normal bowel sounds. The abdomen was soft, non-tender, and without masses, organomegaly, or appreciable enlargement of the abdominal aorta. BACK:  No CVA tenderness.  No tenderness on percussion of the back or rib cage.  Intermittent tenderness in the left hip SKIN: Poor skin turgor EXTREMITIES: 1+ swelling of both lower extremity LYMPH NODES: No palpable cervical, supraclavicular, axillary or inguinal adenopathy  NEUROLOGICAL: She has residual weakness on the right side walking with the help of cane PSYCH:  Appropriate.very apprehensive.    Filed Vitals:   09/27/15 0901  BP: 107/70  Pulse:  109  Temp: 96.3 F (35.7 C)     Body mass index is 24.67 kg/(m^2).    ECOG FS:1 - Symptomatic but completely ambulatory  LAB RESULTS:  Infusion on 09/27/2015  Component Date Value Ref Range Status  . WBC 09/27/2015 8.7  4.0 - 10.5 K/uL Final   A-LINE DRAW  . RBC 09/27/2015 3.33* 4.40 - 5.90 MIL/uL Final  . Hemoglobin 09/27/2015 9.5* 13.0 - 18.0 g/dL Final  . HCT 09/27/2015 28.4* 40.0 - 52.0 % Final  . MCV 09/27/2015 85.1  80.0 - 100.0 fL Final  . MCH 09/27/2015 28.7  26.0 - 34.0 pg Final  . MCHC 09/27/2015 33.7  32.0 - 36.0 g/dL Final  . RDW 09/27/2015 19.1* 11.5 - 14.5 % Final  . Platelets 09/27/2015 235  150 - 440 K/uL Final  . Neutrophils Relative % 09/27/2015 84   Final  . Neutro Abs 09/27/2015 7.2* 1.4 - 6.5 K/uL Final  . Lymphocytes Relative 09/27/2015 7   Final  . Lymphs Abs 09/27/2015 0.6* 1.0 - 3.6 K/uL Final  . Monocytes Relative 09/27/2015 9   Final  . Monocytes Absolute 09/27/2015 0.8  0.2 - 1.0 K/uL Final  . Eosinophils Relative 09/27/2015 0   Final  . Eosinophils Absolute 09/27/2015 0.0  0 - 0.7 K/uL Final  . Basophils Relative 09/27/2015 0   Final  . Basophils Absolute 09/27/2015 0.0  0 - 0.1 K/uL Final  . Sodium 09/27/2015 132* 135 - 145 mmol/L Final  . Potassium 09/27/2015 3.7  3.5 - 5.1 mmol/L Final  . Chloride 09/27/2015 100* 101 - 111 mmol/L Final  . CO2 09/27/2015 25  22 - 32 mmol/L Final  . Glucose, Bld 09/27/2015 327* 65 - 99 mg/dL Final  . BUN 09/27/2015 18  6 - 20 mg/dL Final  . Creatinine, Ser 09/27/2015 1.03  0.61 - 1.24 mg/dL Final  . Calcium 09/27/2015 7.3* 8.9 - 10.3 mg/dL Final  . Total Protein 09/27/2015 5.6* 6.5 - 8.1 g/dL Final  .  Albumin 09/27/2015 2.6* 3.5 - 5.0 g/dL Final  . AST 09/27/2015 35  15 - 41 U/L Final  . ALT 09/27/2015 43  17 - 63 U/L Final  . Alkaline Phosphatase 09/27/2015 102  38 - 126 U/L Final  . Total Bilirubin 09/27/2015 0.6  0.3 - 1.2 mg/dL Final  . GFR calc non Af Amer 09/27/2015 >60  >60 mL/min Final  . GFR calc Af  Amer 09/27/2015 >60  >60 mL/min Final   Comment: (NOTE) The eGFR has been calculated using the CKD EPI equation. This calculation has not been validated in all clinical situations. eGFR's persistently <60 mL/min signify possible Chronic Kidney Disease.   . Anion gap 09/27/2015 7  5 - 15 Final  . Magnesium 09/27/2015 1.9  1.7 - 2.4 mg/dL Final      ASSESSMENT:  Non-small cell carcinoma of lung left lower lobe.  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 Patient fell and had ecchymosis on the face which is resolving Lower extremity swelling 1+ we will discontinue hydrochlorothiazide and add Lasix All lab data has been reviewed Will continue third cycle of chemotherapy Get a chest x-ray because of increasing shortness of breath Will check oxygen level on exertion patient will get 10 minute walking test And if oxygen level drops below 88% and oxygen would be recommended If chest x-ray shows any further progressive disease then CT scan would be done otherwise CT or PET scan would be done after 4 cycles of chemotherapy Continue folic acid and vitamin B12 injection  On 10 minute walking test patient's oxygen saturation dropped to 77% So patient was authorized to start oxygen all the forms were filled out Patient was start to liter of oxygen on a continuing basis Chest x-rays been done which has been reviewed does not show any progressive disease but there is some increasing right lower lobe infiltrate Patient was started on Levaquin 500 milligrams daily for 7 days if there is no improvement in chest x-ray CT scan would be done Carcinoma of left lung   Staging form: Lung, AJCC 7th Edition     Clinical: Stage 4 (T4, N2.  m1) - Signed by Forest Gleason, MD on 07/05/2015  Forest Gleason, MD   09/28/2015 8:06 AM

## 2015-09-28 ENCOUNTER — Encounter: Payer: Self-pay | Admitting: Oncology

## 2015-10-01 ENCOUNTER — Other Ambulatory Visit: Payer: Self-pay | Admitting: Oncology

## 2015-10-03 ENCOUNTER — Other Ambulatory Visit: Payer: Self-pay | Admitting: *Deleted

## 2015-10-03 DIAGNOSIS — C3492 Malignant neoplasm of unspecified part of left bronchus or lung: Secondary | ICD-10-CM

## 2015-10-04 ENCOUNTER — Inpatient Hospital Stay: Payer: Medicare Other

## 2015-10-04 ENCOUNTER — Encounter: Payer: Self-pay | Admitting: Oncology

## 2015-10-04 ENCOUNTER — Inpatient Hospital Stay (HOSPITAL_BASED_OUTPATIENT_CLINIC_OR_DEPARTMENT_OTHER): Payer: Medicare Other | Admitting: Oncology

## 2015-10-04 VITALS — BP 106/72 | HR 124 | Temp 96.7°F

## 2015-10-04 DIAGNOSIS — C7951 Secondary malignant neoplasm of bone: Secondary | ICD-10-CM | POA: Diagnosis not present

## 2015-10-04 DIAGNOSIS — Z5111 Encounter for antineoplastic chemotherapy: Secondary | ICD-10-CM | POA: Diagnosis not present

## 2015-10-04 DIAGNOSIS — R0602 Shortness of breath: Secondary | ICD-10-CM

## 2015-10-04 DIAGNOSIS — C3492 Malignant neoplasm of unspecified part of left bronchus or lung: Secondary | ICD-10-CM

## 2015-10-04 DIAGNOSIS — J90 Pleural effusion, not elsewhere classified: Secondary | ICD-10-CM

## 2015-10-04 DIAGNOSIS — R600 Localized edema: Secondary | ICD-10-CM

## 2015-10-04 DIAGNOSIS — I4891 Unspecified atrial fibrillation: Secondary | ICD-10-CM

## 2015-10-04 DIAGNOSIS — I482 Chronic atrial fibrillation, unspecified: Secondary | ICD-10-CM

## 2015-10-04 DIAGNOSIS — R5383 Other fatigue: Secondary | ICD-10-CM

## 2015-10-04 DIAGNOSIS — Z923 Personal history of irradiation: Secondary | ICD-10-CM | POA: Diagnosis not present

## 2015-10-04 DIAGNOSIS — J449 Chronic obstructive pulmonary disease, unspecified: Secondary | ICD-10-CM

## 2015-10-04 DIAGNOSIS — Z7901 Long term (current) use of anticoagulants: Secondary | ICD-10-CM

## 2015-10-04 DIAGNOSIS — C3432 Malignant neoplasm of lower lobe, left bronchus or lung: Secondary | ICD-10-CM

## 2015-10-04 DIAGNOSIS — Z87891 Personal history of nicotine dependence: Secondary | ICD-10-CM

## 2015-10-04 DIAGNOSIS — R531 Weakness: Secondary | ICD-10-CM

## 2015-10-04 LAB — CBC WITH DIFFERENTIAL/PLATELET
Basophils Absolute: 0 10*3/uL (ref 0–0.1)
Basophils Relative: 0 %
EOS PCT: 0 %
Eosinophils Absolute: 0 10*3/uL (ref 0–0.7)
HCT: 26.5 % — ABNORMAL LOW (ref 40.0–52.0)
HEMOGLOBIN: 8.9 g/dL — AB (ref 13.0–18.0)
LYMPHS PCT: 13 %
Lymphs Abs: 0.5 10*3/uL — ABNORMAL LOW (ref 1.0–3.6)
MCH: 28.8 pg (ref 26.0–34.0)
MCHC: 33.7 g/dL (ref 32.0–36.0)
MCV: 85.5 fL (ref 80.0–100.0)
MONO ABS: 0.1 10*3/uL — AB (ref 0.2–1.0)
MONOS PCT: 3 %
NEUTROS ABS: 3.3 10*3/uL (ref 1.4–6.5)
Neutrophils Relative %: 84 %
Platelets: 83 10*3/uL — ABNORMAL LOW (ref 150–440)
RBC: 3.1 MIL/uL — AB (ref 4.40–5.90)
RDW: 21.3 % — ABNORMAL HIGH (ref 11.5–14.5)
WBC: 3.9 10*3/uL (ref 3.8–10.6)

## 2015-10-04 LAB — MAGNESIUM: MAGNESIUM: 1.7 mg/dL (ref 1.7–2.4)

## 2015-10-04 LAB — COMPREHENSIVE METABOLIC PANEL
ALT: 48 U/L (ref 17–63)
ANION GAP: 8 (ref 5–15)
AST: 49 U/L — ABNORMAL HIGH (ref 15–41)
Albumin: 2.8 g/dL — ABNORMAL LOW (ref 3.5–5.0)
Alkaline Phosphatase: 99 U/L (ref 38–126)
BUN: 28 mg/dL — ABNORMAL HIGH (ref 6–20)
CHLORIDE: 99 mmol/L — AB (ref 101–111)
CO2: 24 mmol/L (ref 22–32)
CREATININE: 0.96 mg/dL (ref 0.61–1.24)
Calcium: 6.8 mg/dL — ABNORMAL LOW (ref 8.9–10.3)
GFR calc non Af Amer: >60 mL/min (ref >60)
Glucose, Bld: 266 mg/dL — ABNORMAL HIGH (ref 65–99)
POTASSIUM: 4.1 mmol/L (ref 3.5–5.1)
SODIUM: 131 mmol/L — AB (ref 135–145)
Total Bilirubin: 1.1 mg/dL (ref 0.3–1.2)
Total Protein: 5.6 g/dL — ABNORMAL LOW (ref 6.5–8.1)

## 2015-10-04 MED ORDER — SODIUM CHLORIDE 0.9 % IV SOLN
INTRAVENOUS | Status: AC
  Administered 2015-10-04: 14:00:00 via INTRAVENOUS
  Filled 2015-10-04: qty 1000

## 2015-10-04 MED ORDER — SODIUM CHLORIDE 0.9 % IJ SOLN
10.0000 mL | Freq: Once | INTRAMUSCULAR | Status: AC
  Administered 2015-10-04: 10 mL via INTRAVENOUS
  Filled 2015-10-04: qty 10

## 2015-10-04 MED ORDER — HYDROCORTISONE NA SUCCINATE PF 100 MG IJ SOLR: 125.0000 mg | Freq: Once | INTRAMUSCULAR | Status: DC

## 2015-10-04 MED ORDER — HEPARIN SOD (PORK) LOCK FLUSH 100 UNIT/ML IV SOLN
500.0000 [IU] | Freq: Once | INTRAVENOUS | Status: AC
  Administered 2015-10-04: 500 [IU] via INTRAVENOUS
  Filled 2015-10-04: qty 5

## 2015-10-04 MED ORDER — METHYLPREDNISOLONE SODIUM SUCC 125 MG IJ SOLR
125.0000 mg | Freq: Once | INTRAMUSCULAR | Status: AC
  Administered 2015-10-04: 125 mg via INTRAVENOUS
  Filled 2015-10-04: qty 2

## 2015-10-05 ENCOUNTER — Ambulatory Visit
Admission: RE | Admit: 2015-10-05 | Discharge: 2015-10-05 | Disposition: A | Discharge status: Home / Self Care | Payer: Medicare Other | Source: Ambulatory Visit | Attending: Oncology | Admitting: Oncology

## 2015-10-05 ENCOUNTER — Inpatient Hospital Stay: Payer: Medicare Other

## 2015-10-05 ENCOUNTER — Inpatient Hospital Stay (HOSPITAL_BASED_OUTPATIENT_CLINIC_OR_DEPARTMENT_OTHER): Payer: Medicare Other | Admitting: Oncology

## 2015-10-05 VITALS — BP 85/60 | HR 10 | Temp 96.6°F

## 2015-10-05 DIAGNOSIS — J449 Chronic obstructive pulmonary disease, unspecified: Secondary | ICD-10-CM

## 2015-10-05 DIAGNOSIS — I700 Atherosclerosis of aorta: Secondary | ICD-10-CM | POA: Diagnosis not present

## 2015-10-05 DIAGNOSIS — C3492 Malignant neoplasm of unspecified part of left bronchus or lung: Secondary | ICD-10-CM | POA: Diagnosis present

## 2015-10-05 DIAGNOSIS — Z923 Personal history of irradiation: Secondary | ICD-10-CM

## 2015-10-05 DIAGNOSIS — I4891 Unspecified atrial fibrillation: Secondary | ICD-10-CM

## 2015-10-05 DIAGNOSIS — C7951 Secondary malignant neoplasm of bone: Secondary | ICD-10-CM | POA: Diagnosis not present

## 2015-10-05 DIAGNOSIS — R531 Weakness: Secondary | ICD-10-CM

## 2015-10-05 DIAGNOSIS — C3432 Malignant neoplasm of lower lobe, left bronchus or lung: Secondary | ICD-10-CM | POA: Diagnosis not present

## 2015-10-05 DIAGNOSIS — J90 Pleural effusion, not elsewhere classified: Secondary | ICD-10-CM | POA: Insufficient documentation

## 2015-10-05 DIAGNOSIS — R918 Other nonspecific abnormal finding of lung field: Secondary | ICD-10-CM | POA: Diagnosis not present

## 2015-10-05 DIAGNOSIS — Z87891 Personal history of nicotine dependence: Secondary | ICD-10-CM

## 2015-10-05 DIAGNOSIS — I4892 Unspecified atrial flutter: Secondary | ICD-10-CM

## 2015-10-05 DIAGNOSIS — R0602 Shortness of breath: Secondary | ICD-10-CM | POA: Diagnosis present

## 2015-10-05 DIAGNOSIS — Z7901 Long term (current) use of anticoagulants: Secondary | ICD-10-CM

## 2015-10-05 DIAGNOSIS — J439 Emphysema, unspecified: Secondary | ICD-10-CM | POA: Insufficient documentation

## 2015-10-05 DIAGNOSIS — R600 Localized edema: Secondary | ICD-10-CM

## 2015-10-05 DIAGNOSIS — R5383 Other fatigue: Secondary | ICD-10-CM

## 2015-10-05 DIAGNOSIS — Z5111 Encounter for antineoplastic chemotherapy: Secondary | ICD-10-CM | POA: Diagnosis not present

## 2015-10-05 MED ORDER — FUROSEMIDE 40 MG PO TABS: ORAL_TABLET | ORAL | Status: DC

## 2015-10-05 MED ORDER — IOHEXOL 350 MG/ML SOLN
100.0000 mL | Freq: Once | INTRAVENOUS | Status: AC | PRN
  Administered 2015-10-05: 85 mL via INTRAVENOUS

## 2015-10-05 MED ORDER — FUROSEMIDE 40 MG PO TABS: ORAL_TABLET | ORAL | Status: AC

## 2015-10-05 NOTE — Progress Notes (Signed)
Patient here for CT results.  

## 2015-10-06 ENCOUNTER — Encounter: Payer: Self-pay | Admitting: Oncology

## 2015-10-06 NOTE — Progress Notes (Signed)
[place] @ [facility] Telephone:(336) [phone]  Fax:(336) [place] OB: 22-Mar-1941  MR#: [id number]  ZTI#:[id number]  Patient Care Team: Tracie Harrier, MD as PCP - General (Internal Medicine)  Oncology History   1.  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  Patient has finished radiation therapy to the lung mass (palliative therapy because of hemoptysis) in August of 2016 4.  Patient was started on carboplatinum and Alimta from August 16 2015  CHIEF COMPLAINT:  Patient continues to feel weak and tired.  Some improvement.  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    Oncology Flowsheet 08/01/2015 08/10/2015 08/16/2015 09/06/2015 09/13/2015 09/27/2015 10/04/2015  Day, Cycle - - Day 1, 1 Day 1, Cycle 2 - Day 1, Cycle 3 -  CARBOplatin (PARAPLATIN) IV - - 460 mg 460 mg - 460 mg -  cyanocobalamin ((VITAMIN B-12)) IM - 1,000 mcg - 1,000 mcg - 1,000 mcg -  denosumab (XGEVA) [place] - - 120 mg - 120 mg - -  dexamethasone (DECADRON) IJ - - - - - - -  dexamethasone (DECADRON) IV - - [ 12 mg ] [ 12 mg ] - [ 12 mg ] -  fosaprepitant (EMEND) IV - - [ 150 mg ] [ 150 mg ] - [ 150 mg ] -  methylPREDNISolone sodium succinate 125 mg/2 mL (SOLU-MEDROL) IV - - - - - - 125 mg  ondansetron (ZOFRAN) IV - - - - - - -  palonosetron (ALOXI) IV - - 0.25 mg 0.25 mg - 0.25 mg -  PEMEtrexed (ALIMTA) IV - - 500 mg/m2 500 mg/m2 - 500 mg/m2 -   1.  Patient has atrial fibrillation on a long-term xeralto 2.  History of cerebrovascular accident when he was taken off of xeralto 3.   Hypercholesterolemia 4.  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  Somewhat improvement since yesterday had a CT scan of the chest done. Increasing shortness of breath EKG revealed atrial fibrillation with fast ventricular response CT scan has been reviewed independently  REVIEW OF SYSTEMS:    general status: Patient is feeling weak and tired.  No change in a performance status.  No chills.  No fever. HEENT  difficulty swallowing S Benson in history of present illness Lungs: No cough or shortness of breath Cardiac: No chest pain or paroxysmal nocturnal dyspnea GI: No nausea no vomiting no diarrhea no abdominal pain Skin: No rash Lower extremity no swelling Neurological system: No tingling.  No numbness.  No other focal signs Musculoskeletal system no bony pains  GU: Patient had a high PSA but biopsy was negative All other 12 systems have been reviewed As per HPI. Otherwise, a complete review of systems is negatve.  PAST MEDICAL HISTORY: Past Medical History  Diagnosis Date  . Hypertension   . Pneumonia   . Cancer (HCC)     lung mass  . Stroke ([place])   .  COPD (chronic obstructive pulmonary disease) ([place])   . Carcinoma of left lung ([place]) 07/05/2015  . Anorexia 03/2016  . Stroke [facility]) 03/2016    PAST SURGICAL HISTORY:  had a cataract surgery in the past.  Has a large prostate biopsy was done.   FAMILY HISTORY There is no significant family history of breast cancer, ovarian cancer, colon cancer  grandmother had stomach cancer family history of heart disease     ADVANCED DIRECTIVES:  Patient does have advance healthcare directive, Patient   does not desire to make any changes  HEALTH MAINTENANCE: Social History  Substance Use Topics  . Smoking status: Former Smoker    Types: Cigarettes    Quit date:  06/27/2005  . Smokeless tobacco: None  . Alcohol Use: No     Allergies  Allergen Reactions  . Celebrex [Celecoxib]   . Zolpidem Tartrate Anxiety    Current Outpatient Prescriptions  Medication Sig Dispense Refill  . albuterol (PROVENTIL) (2.5 MG/3ML) 0.083% nebulizer solution Take 3 mLs (2.5 mg total) by nebulization every 4 (four) hours as needed for wheezing or shortness of breath. 75 mL 12  . atorvastatin (LIPITOR) 20 MG tablet Take 20 mg by mouth daily.    . Calcium 600-200 MG-UNIT tablet Take 1 tablet by mouth 2 (two) times daily. 60 tablet 3  . chlorpheniramine-HYDROcodone (TUSSIONEX) 10-8 MG/5ML SUER Take 5 mLs by mouth every 12 (twelve) hours as needed for cough. 140 mL 0  . citalopram (CELEXA) 20 MG tablet Take 1 tablet (20 mg total) by mouth daily. 30 tablet 3  . dexamethasone (DECADRON) 4 MG tablet Take 1 tab two times a day the day before Alimta chemo. Take 2 tabs two times a day starting the day after chemo for 3 days. 30 tablet 1  . fluconazole (DIFLUCAN) 100 MG tablet Take 1 tablet (100 mg total) by mouth daily. 5 tablet 0  . Fluticasone-Salmeterol (ADVAIR DISKUS) 250-50 MCG/DOSE AEPB Inhale 1 puff into the lungs 2 (two) times daily.    . folic acid (FOLVITE) 1 MG tablet Take 1 tablet (1 mg total) by mouth daily. 30 tablet 3  . furosemide (LASIX) 40 MG tablet Take 2 tabs PO in the AM for 5 days then resume 1 tab PO daily 30 tablet 3  . hydrochlorothiazide (HYDRODIURIL) 12.5 MG tablet   2  . levofloxacin (LEVAQUIN) 500 MG tablet Take 1 tablet (500 mg total) by mouth daily. 7 tablet 0  . lidocaine-prilocaine (EMLA) cream Apply 1 application topically as needed. 30 g 3  . LORazepam (ATIVAN) 0.5 MG tablet Take 0.5 mg by mouth every 6 (six) hours as needed for anxiety.    Marland Kitchen losartan (COZAAR) 100 MG tablet Take 100 mg by mouth daily.    . Nebulizers (VIOS AEROSOL DELIVERY SYSTEM) MISC U UTD  0  . nystatin (MYCOSTATIN) 100000 UNIT/ML suspension Take 5 mLs by mouth 4 (four) times  daily.    . ondansetron (ZOFRAN) 8 MG tablet Take 1 tablet (8 mg total) by mouth 2 (two) times daily. Start the day after chemo for 3 days. Then take as needed for nausea or vomiting. 30 tablet 1  . oxyCODONE-acetaminophen (PERCOCET/ROXICET) 5-325 MG per tablet Take 1-2 tablets by mouth every 6 (six) hours as needed for moderate pain. 30 tablet 0  . predniSONE (DELTASONE) 10 MG tablet   0  . predniSONE (DELTASONE) 20 MG tablet TAKE ONE TABLET BY MOUTH ONCE DAILY WITH BREAKFAST 30 tablet 0  . rivaroxaban (XARELTO) 20 MG  TABS tablet Take 20 mg by mouth daily with supper.    . sucralfate (CARAFATE) 1 G tablet Take 1 tablet (1 g total) by mouth 4 (four) times daily -  with meals and at bedtime. 90 tablet 2  . VENTOLIN HFA 108 (90 BASE) MCG/ACT inhaler Inhale 1 puff into the lungs every 4 (four) hours as needed for wheezing or shortness of breath.   0   No current facility-administered medications for this visit.   Facility-Administered Medications Ordered in Other Visits  Medication Dose Route Frequency Provider Last Rate Last Dose  . 0.9 %  sodium chloride infusion   Intravenous Continuous Forest Gleason, MD   Stopped at 10/04/15 1610    OBJECTIVE: PHYSICAL EXAM:GENERAL:  Well developed, well nourished, sitting comfortably in the exam room in no acute distress.  Patient is walking with the help of cane  MENTAL STATUS:  Alert and oriented to person, place and time. HEAD.  Ecchymosis which is resolving on the face. EYES:    Pupils equal round and reactive to light and accomodation.  No conjunctivitis or scleral icterus. ENT:  There is a small area of redness and candidiasis on the left side of the throat RESPIRATORY:  Diminished and entry on both sides.  Emphysematous chest.  His dullness on percussion on the left lower chest .  Diminished air entry on the right side Course crepitation with at the left lower base CARDIOVASCULAR:  Regular rate and rhythm without murmur, rub or gallop. Abdominal exam  revealed normal bowel sounds. The abdomen was soft, non-tender, and without masses, organomegaly, or appreciable enlargement of the abdominal aorta. BACK:  No CVA tenderness.  No tenderness on percussion of the back or rib cage.  Intermittent tenderness in the left hip SKIN: Poor skin turgor EXTREMITIES: 1+ swelling of both lower extremity LYMPH NODES: No palpable cervical, supraclavicular, axillary or inguinal adenopathy  NEUROLOGICAL: She has residual weakness on the right side walking with the help of cane PSYCH:  Appropriate.very apprehensive.    Filed Vitals:   10/05/15 1207  BP: 85/60  Pulse: 10  Temp: 96.6 F (35.9 C)     There is no weight on file to calculate BMI.    ECOG FS:1 - Symptomatic but completely ambulatory  LAB RESULTS:  Appointment on 10/04/2015  Component Date Value Ref Range Status  . WBC 10/04/2015 3.9  3.8 - 10.6 K/uL Final  . RBC 10/04/2015 3.10* 4.40 - 5.90 MIL/uL Final  . Hemoglobin 10/04/2015 8.9* 13.0 - 18.0 g/dL Final  . HCT 10/04/2015 26.5* 40.0 - 52.0 % Final  . MCV 10/04/2015 85.5  80.0 - 100.0 fL Final  . MCH 10/04/2015 28.8  26.0 - 34.0 pg Final  . MCHC 10/04/2015 33.7  32.0 - 36.0 g/dL Final  . RDW 10/04/2015 21.3* 11.5 - 14.5 % Final  . Platelets 10/04/2015 83* 150 - 440 K/uL Final  . Neutrophils Relative % 10/04/2015 84   Final  . Neutro Abs 10/04/2015 3.3  1.4 - 6.5 K/uL Final  . Lymphocytes Relative 10/04/2015 13   Final  . Lymphs Abs 10/04/2015 0.5* 1.0 - 3.6 K/uL Final  . Monocytes Relative 10/04/2015 3   Final  . Monocytes Absolute 10/04/2015 0.1* 0.2 - 1.0 K/uL Final  . Eosinophils Relative 10/04/2015 0   Final  . Eosinophils Absolute 10/04/2015 0.0  0 - 0.7 K/uL Final  . Basophils Relative 10/04/2015 0   Final  . Basophils Absolute 10/04/2015 0.0  0 - 0.1 K/uL Final  .  Sodium 10/04/2015 131* 135 - 145 mmol/L Final  . Potassium 10/04/2015 4.1  3.5 - 5.1 mmol/L Final  . Chloride 10/04/2015 99* 101 - 111 mmol/L Final  . CO2 10/04/2015  24  22 - 32 mmol/L Final  . Glucose, Bld 10/04/2015 266* 65 - 99 mg/dL Final  . BUN 10/04/2015 28* 6 - 20 mg/dL Final  . Creatinine, Ser 10/04/2015 0.96  0.61 - 1.24 mg/dL Final  . Calcium 10/04/2015 6.8* 8.9 - 10.3 mg/dL Final  . Total Protein 10/04/2015 5.6* 6.5 - 8.1 g/dL Final  . Albumin 10/04/2015 2.8* 3.5 - 5.0 g/dL Final  . AST 10/04/2015 49* 15 - 41 U/L Final  . ALT 10/04/2015 48  17 - 63 U/L Final  . Alkaline Phosphatase 10/04/2015 99  38 - 126 U/L Final  . Total Bilirubin 10/04/2015 1.1  0.3 - 1.2 mg/dL Final  . GFR calc non Af Amer 10/04/2015 >60  >60 mL/min Final  . GFR calc Af Amer 10/04/2015 >60  >60 mL/min Final   Comment: (NOTE) The eGFR has been calculated using the CKD EPI equation. This calculation has not been validated in all clinical situations. eGFR's persistently <60 mL/min signify possible Chronic Kidney Disease.   . Anion gap 10/04/2015 8  5 - 15 Final  . Magnesium 10/04/2015 1.7  1.7 - 2.4 mg/dL Final      ASSESSMENT:  Non-small cell carcinoma of lung left lower lobe.  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 CT scan shows bilateral pleural effusion Lower extremity swelling suggestive of possibility of mild congestive heart failure Patient has been referred to cardiologist for further evaluation Lasix was increased to 80 mg daily for a few days Patient will be reevaluated after cardiology's evaluation Carcinoma of left lung   Staging form: Lung, AJCC 7th Edition     Clinical: Stage 4 (T4, N2.  m1) - Signed by Forest Gleason, MD on 07/05/2015  Forest Gleason, MD   10/06/2015 9:59 AM

## 2015-10-07 ENCOUNTER — Encounter: Payer: Self-pay | Admitting: Oncology

## 2015-10-07 NOTE — Progress Notes (Signed)
[place] @ [facility] [phone]) [phone]  Fax:(336) [place] OB: 1941/02/07  MR#: [id number]  MHD#:[id number]  Patient Care Team: Tracie Harrier, MD as PCP - General (Internal Medicine)  Oncology History   1.  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  Patient has finished radiation therapy to the lung mass (palliative therapy because of hemoptysis) in August of 2016 4.  Patient was started on carboplatinum and Alimta from August 16 2015  CHIEF COMPLAINT:  Chief Complaint  Patient presents with  . 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    Oncology Flowsheet 08/01/2015 08/10/2015 08/16/2015 09/06/2015 09/13/2015 09/27/2015 10/04/2015  Day, Cycle - - Day 1, 1 Day 1, Cycle 2 - Day 1, Cycle 3 -  CARBOplatin (PARAPLATIN) IV - - 460 mg 460 mg - 460 mg -  cyanocobalamin ((VITAMIN B-12)) IM - 1,000 mcg - 1,000 mcg - 1,000 mcg -  denosumab (XGEVA) [place] - - 120 mg - 120 mg - -  dexamethasone (DECADRON) IJ - - - - - - -  dexamethasone (DECADRON) IV - - [ 12 mg ] [ 12 mg ] - [ 12 mg ] -  fosaprepitant (EMEND) IV - - [ 150 mg ] [ 150 mg ] - [ 150 mg ] -  methylPREDNISolone sodium succinate 125 mg/2 mL (SOLU-MEDROL) IV - - - - - - 125 mg  ondansetron (ZOFRAN) IV - - - - - - -  palonosetron (ALOXI) IV - - 0.25 mg 0.25 mg - 0.25 mg -  PEMEtrexed (ALIMTA) IV - - 500 mg/m2 500 mg/m2 - 500 mg/m2 -   1.  Patient has atrial fibrillation on a long-term xeralto 2.  History of cerebrovascular accident when he was taken off of xeralto 3.  Hypercholesterolemia 4.  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  Has developed some swelling of lower extremity.  Extremely depressed.  Agitated.  No chest pain.  Dry hacking cough without any expectoration no fever feeling weak and tired REVIEW OF SYSTEMS:    general status: Patient is feeling weak and tired.  No change in a performance status.  No chills.  No fever. HEENT  difficulty swallowing S Benson in history of present illness Lungs: No cough or shortness of breath Cardiac: No chest pain or paroxysmal nocturnal dyspnea GI: No nausea no vomiting no diarrhea no abdominal pain Skin: No rash Lower extremity no swelling Neurological system: No tingling.  No numbness.  No other focal signs Musculoskeletal system no bony pains  GU: Patient had a high PSA but biopsy was negative All other 12 systems have been reviewed As per HPI. Otherwise, a complete review of systems is negatve.  PAST MEDICAL HISTORY: Past Medical History  Diagnosis Date  . Hypertension   . Pneumonia   . Cancer (HCC)     lung mass  . Stroke ([place])   . COPD (chronic obstructive pulmonary disease) ([place])   . Carcinoma of left lung ([place]) 07/05/2015  . Anorexia 03/2016  . Stroke [facility]) 03/2016  PAST SURGICAL HISTORY:  had a cataract surgery in the past.  Has a large prostate biopsy was done.   FAMILY HISTORY There is no significant family history of breast cancer, ovarian cancer, colon cancer  grandmother had stomach cancer family history of heart disease     ADVANCED DIRECTIVES:  Patient does have advance healthcare directive, Patient   does not desire to make any changes  HEALTH MAINTENANCE: Social History  Substance Use Topics  . Smoking status: Former Smoker    Types: Cigarettes    Quit date: 06/27/2005  . Smokeless tobacco: None  . Alcohol Use: No     Allergies  Allergen Reactions  . Celebrex [Celecoxib]   . Zolpidem Tartrate Anxiety    Current Outpatient Prescriptions  Medication Sig Dispense  Refill  . albuterol (PROVENTIL) (2.5 MG/3ML) 0.083% nebulizer solution Take 3 mLs (2.5 mg total) by nebulization every 4 (four) hours as needed for wheezing or shortness of breath. 75 mL 12  . atorvastatin (LIPITOR) 20 MG tablet Take 20 mg by mouth daily.    . Calcium 600-200 MG-UNIT tablet Take 1 tablet by mouth 2 (two) times daily. 60 tablet 3  . chlorpheniramine-HYDROcodone (TUSSIONEX) 10-8 MG/5ML SUER Take 5 mLs by mouth every 12 (twelve) hours as needed for cough. 140 mL 0  . citalopram (CELEXA) 20 MG tablet Take 1 tablet (20 mg total) by mouth daily. 30 tablet 3  . dexamethasone (DECADRON) 4 MG tablet Take 1 tab two times a day the day before Alimta chemo. Take 2 tabs two times a day starting the day after chemo for 3 days. 30 tablet 1  . fluconazole (DIFLUCAN) 100 MG tablet Take 1 tablet (100 mg total) by mouth daily. 5 tablet 0  . Fluticasone-Salmeterol (ADVAIR DISKUS) 250-50 MCG/DOSE AEPB Inhale 1 puff into the lungs 2 (two) times daily.    . folic acid (FOLVITE) 1 MG tablet Take 1 tablet (1 mg total) by mouth daily. 30 tablet 3  . furosemide (LASIX) 40 MG tablet Take 2 tabs PO in the AM for 5 days then resume 1 tab PO daily 30 tablet 3  . hydrochlorothiazide (HYDRODIURIL) 12.5 MG tablet   2  . levofloxacin (LEVAQUIN) 500 MG tablet Take 1 tablet (500 mg total) by mouth daily. 7 tablet 0  . lidocaine-prilocaine (EMLA) cream Apply 1 application topically as needed. 30 g 3  . LORazepam (ATIVAN) 0.5 MG tablet Take 0.5 mg by mouth every 6 (six) hours as needed for anxiety.    Marland Kitchen losartan (COZAAR) 100 MG tablet Take 100 mg by mouth daily.    . Nebulizers (VIOS AEROSOL DELIVERY SYSTEM) MISC U UTD  0  . nystatin (MYCOSTATIN) 100000 UNIT/ML suspension Take 5 mLs by mouth 4 (four) times daily.    . ondansetron (ZOFRAN) 8 MG tablet Take 1 tablet (8 mg total) by mouth 2 (two) times daily. Start the day after chemo for 3 days. Then take as needed for nausea or vomiting. 30 tablet 1  .  oxyCODONE-acetaminophen (PERCOCET/ROXICET) 5-325 MG per tablet Take 1-2 tablets by mouth every 6 (six) hours as needed for moderate pain. 30 tablet 0  . predniSONE (DELTASONE) 10 MG tablet   0  . predniSONE (DELTASONE) 20 MG tablet TAKE ONE TABLET BY MOUTH ONCE DAILY WITH BREAKFAST 30 tablet 0  . rivaroxaban (XARELTO) 20 MG TABS tablet Take 20 mg by mouth daily with supper.    . sucralfate (CARAFATE) 1 G tablet Take 1 tablet (1 g total) by mouth  4 (four) times daily -  with meals and at bedtime. 90 tablet 2  . VENTOLIN HFA 108 (90 BASE) MCG/ACT inhaler Inhale 1 puff into the lungs every 4 (four) hours as needed for wheezing or shortness of breath.   0   No current facility-administered medications for this visit.    OBJECTIVE: PHYSICAL EXAM:GENERAL:  Well developed, well nourished, sitting comfortably in the exam room in no acute distress.  Patient is walking with the help of cane  MENTAL STATUS:  Alert and oriented to person, place and time. HEAD.  Ecchymosis which is resolving on the face. EYES:    Pupils equal round and reactive to light and accomodation.  No conjunctivitis or scleral icterus. ENT:  There is a small area of redness and candidiasis on the left side of the throat RESPIRATORY:  Diminished and entry on both sides.  Emphysematous chest.  His dullness on percussion on the left lower chest .  Diminished air entry on the right side Course crepitation with at the left lower base CARDIOVASCULAR:  Regular rate and rhythm without murmur, rub or gallop. Abdominal exam revealed normal bowel sounds. The abdomen was soft, non-tender, and without masses, organomegaly, or appreciable enlargement of the abdominal aorta. BACK:  No CVA tenderness.  No tenderness on percussion of the back or rib cage.  Intermittent tenderness in the left hip SKIN: Poor skin turgor EXTREMITIES: 1+ swelling of both lower extremity LYMPH NODES: No palpable cervical, supraclavicular, axillary or inguinal adenopathy   NEUROLOGICAL: She has residual weakness on the right side walking with the help of cane PSYCH:  Appropriate.very apprehensive.    Filed Vitals:   10/04/15 1318  BP: 106/72  Pulse: 124  Temp: 96.7 F (35.9 C)     There is no weight on file to calculate BMI.    ECOG FS:1 - Symptomatic but completely ambulatory  LAB RESULTS:  Appointment on 10/04/2015  Component Date Value Ref Range Status  . WBC 10/04/2015 3.9  3.8 - 10.6 K/uL Final  . RBC 10/04/2015 3.10* 4.40 - 5.90 MIL/uL Final  . Hemoglobin 10/04/2015 8.9* 13.0 - 18.0 g/dL Final  . HCT 10/04/2015 26.5* 40.0 - 52.0 % Final  . MCV 10/04/2015 85.5  80.0 - 100.0 fL Final  . MCH 10/04/2015 28.8  26.0 - 34.0 pg Final  . MCHC 10/04/2015 33.7  32.0 - 36.0 g/dL Final  . RDW 10/04/2015 21.3* 11.5 - 14.5 % Final  . Platelets 10/04/2015 83* 150 - 440 K/uL Final  . Neutrophils Relative % 10/04/2015 84   Final  . Neutro Abs 10/04/2015 3.3  1.4 - 6.5 K/uL Final  . Lymphocytes Relative 10/04/2015 13   Final  . Lymphs Abs 10/04/2015 0.5* 1.0 - 3.6 K/uL Final  . Monocytes Relative 10/04/2015 3   Final  . Monocytes Absolute 10/04/2015 0.1* 0.2 - 1.0 K/uL Final  . Eosinophils Relative 10/04/2015 0   Final  . Eosinophils Absolute 10/04/2015 0.0  0 - 0.7 K/uL Final  . Basophils Relative 10/04/2015 0   Final  . Basophils Absolute 10/04/2015 0.0  0 - 0.1 K/uL Final  . Sodium 10/04/2015 131* 135 - 145 mmol/L Final  . Potassium 10/04/2015 4.1  3.5 - 5.1 mmol/L Final  . Chloride 10/04/2015 99* 101 - 111 mmol/L Final  . CO2 10/04/2015 24  22 - 32 mmol/L Final  . Glucose, Bld 10/04/2015 266* 65 - 99 mg/dL Final  . BUN 10/04/2015 28* 6 - 20 mg/dL Final  . Creatinine, Ser  10/04/2015 0.96  0.61 - 1.24 mg/dL Final  . Calcium 10/04/2015 6.8* 8.9 - 10.3 mg/dL Final  . Total Protein 10/04/2015 5.6* 6.5 - 8.1 g/dL Final  . Albumin 10/04/2015 2.8* 3.5 - 5.0 g/dL Final  . AST 10/04/2015 49* 15 - 41 U/L Final  . ALT 10/04/2015 48  17 - 63 U/L Final  .  Alkaline Phosphatase 10/04/2015 99  38 - 126 U/L Final  . Total Bilirubin 10/04/2015 1.1  0.3 - 1.2 mg/dL Final  . GFR calc non Af Amer 10/04/2015 >60  >60 mL/min Final  . GFR calc Af Amer 10/04/2015 >60  >60 mL/min Final   Comment: (NOTE) The eGFR has been calculated using the CKD EPI equation. This calculation has not been validated in all clinical situations. eGFR's persistently <60 mL/min signify possible Chronic Kidney Disease.   . Anion gap 10/04/2015 8  5 - 15 Final  . Magnesium 10/04/2015 1.7  1.7 - 2.4 mg/dL Final      ASSESSMENT:  Non-small cell carcinoma of lung left lower lobe.  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 Patient fell and had ecchymosis on the face which is resolving Lower extremity swelling 1+ we will discontinue hydrochlorothiazide and add Lasix All lab data has been reviewed EKG shows atrial fibrillation with fast ventricular response   On 10 minute walking test patient's oxygen saturation dropped to 77% So patient was authorized to start oxygen all the forms were filled out Patient was start to liter of oxygen on a continuing basis Chest x-rays been done which has been reviewed does not show any progressive disease but there is some increasing right lower lobe infiltrate  Patient has been referred to cardiologist for possibility of control of heart rhythm Continue antibiotics repeat CT scan if needed to rule out any progressive disease versus pulmonary emboli  Discussed with the family who has been very depressed and agitatedTotal duration of visit was 10mnutes.  50% or more time was spent in counseling patient and family regarding prognosis and options of treatment and available resources   Staging form: Lung, AJCC 7th Edition     Clinical: Stage 4 (T4, N2.  m1) - Signed by JForest Gleason MD on 07/05/2015  JForest Gleason MD   10/07/2015 11:42 AM

## 2015-10-10 DIAGNOSIS — I5033 Acute on chronic diastolic (congestive) heart failure: Secondary | ICD-10-CM | POA: Insufficient documentation

## 2015-10-11 ENCOUNTER — Inpatient Hospital Stay: Payer: Medicare Other | Attending: Oncology | Admitting: *Deleted

## 2015-10-11 ENCOUNTER — Telehealth: Payer: Self-pay | Admitting: *Deleted

## 2015-10-11 ENCOUNTER — Inpatient Hospital Stay: Payer: Medicare Other

## 2015-10-11 DIAGNOSIS — D649 Anemia, unspecified: Secondary | ICD-10-CM | POA: Insufficient documentation

## 2015-10-11 DIAGNOSIS — C7951 Secondary malignant neoplasm of bone: Secondary | ICD-10-CM | POA: Diagnosis not present

## 2015-10-11 DIAGNOSIS — R600 Localized edema: Secondary | ICD-10-CM | POA: Insufficient documentation

## 2015-10-11 DIAGNOSIS — Z923 Personal history of irradiation: Secondary | ICD-10-CM | POA: Insufficient documentation

## 2015-10-11 DIAGNOSIS — C3432 Malignant neoplasm of lower lobe, left bronchus or lung: Secondary | ICD-10-CM | POA: Insufficient documentation

## 2015-10-11 DIAGNOSIS — C3492 Malignant neoplasm of unspecified part of left bronchus or lung: Secondary | ICD-10-CM

## 2015-10-11 DIAGNOSIS — F329 Major depressive disorder, single episode, unspecified: Secondary | ICD-10-CM | POA: Diagnosis not present

## 2015-10-11 DIAGNOSIS — I10 Essential (primary) hypertension: Secondary | ICD-10-CM | POA: Diagnosis not present

## 2015-10-11 DIAGNOSIS — I4891 Unspecified atrial fibrillation: Secondary | ICD-10-CM | POA: Diagnosis not present

## 2015-10-11 DIAGNOSIS — R5383 Other fatigue: Secondary | ICD-10-CM | POA: Insufficient documentation

## 2015-10-11 DIAGNOSIS — J449 Chronic obstructive pulmonary disease, unspecified: Secondary | ICD-10-CM | POA: Insufficient documentation

## 2015-10-11 DIAGNOSIS — R531 Weakness: Secondary | ICD-10-CM | POA: Insufficient documentation

## 2015-10-11 DIAGNOSIS — I4892 Unspecified atrial flutter: Secondary | ICD-10-CM | POA: Diagnosis not present

## 2015-10-11 DIAGNOSIS — Z7901 Long term (current) use of anticoagulants: Secondary | ICD-10-CM | POA: Diagnosis not present

## 2015-10-11 DIAGNOSIS — R630 Anorexia: Secondary | ICD-10-CM | POA: Diagnosis not present

## 2015-10-11 DIAGNOSIS — Z9221 Personal history of antineoplastic chemotherapy: Secondary | ICD-10-CM | POA: Insufficient documentation

## 2015-10-11 DIAGNOSIS — Z7952 Long term (current) use of systemic steroids: Secondary | ICD-10-CM | POA: Diagnosis not present

## 2015-10-11 DIAGNOSIS — Z87891 Personal history of nicotine dependence: Secondary | ICD-10-CM | POA: Insufficient documentation

## 2015-10-11 DIAGNOSIS — Z8673 Personal history of transient ischemic attack (TIA), and cerebral infarction without residual deficits: Secondary | ICD-10-CM | POA: Diagnosis not present

## 2015-10-11 DIAGNOSIS — E7800 Pure hypercholesterolemia, unspecified: Secondary | ICD-10-CM | POA: Diagnosis not present

## 2015-10-11 DIAGNOSIS — Z79899 Other long term (current) drug therapy: Secondary | ICD-10-CM | POA: Diagnosis not present

## 2015-10-11 LAB — CALCIUM: Calcium: 6.9 mg/dL — ABNORMAL LOW (ref 8.9–10.3)

## 2015-10-11 LAB — CBC WITH DIFFERENTIAL/PLATELET
Basophils Absolute: 0 10*3/uL (ref 0–0.1)
Basophils Relative: 0 %
Eosinophils Absolute: 0 10*3/uL (ref 0–0.7)
Eosinophils Relative: 0 %
HCT: 22.7 % — ABNORMAL LOW (ref 40.0–52.0)
HEMOGLOBIN: 7.6 g/dL — AB (ref 13.0–18.0)
LYMPHS ABS: 0.7 10*3/uL — AB (ref 1.0–3.6)
Lymphocytes Relative: 21 %
MCH: 29.1 pg (ref 26.0–34.0)
MCHC: 33.5 g/dL (ref 32.0–36.0)
MCV: 86.8 fL (ref 80.0–100.0)
MONO ABS: 0.3 10*3/uL (ref 0.2–1.0)
NEUTROS ABS: 2.2 10*3/uL (ref 1.4–6.5)
Platelets: 44 10*3/uL — ABNORMAL LOW (ref 150–440)
RBC: 2.62 MIL/uL — ABNORMAL LOW (ref 4.40–5.90)
RDW: 22 % — ABNORMAL HIGH (ref 11.5–14.5)
WBC: 3.2 10*3/uL — ABNORMAL LOW (ref 3.8–10.6)

## 2015-10-11 NOTE — Telephone Encounter (Signed)
Called patient and left message for him to increase his calcium pills to 2 pills a day.  If he is not taking them he will need to start taking 2 tablets a day.

## 2015-10-11 NOTE — Telephone Encounter (Signed)
Pt needs to be taking calcium 2 tabs BID due to low calcium today.

## 2015-10-12 ENCOUNTER — Telehealth: Payer: Self-pay | Admitting: *Deleted

## 2015-10-12 MED ORDER — CALCIUM 600-200 MG-UNIT PO TABS: 1.0000 | ORAL_TABLET | Freq: Two times a day (BID) | ORAL | Status: AC

## 2015-10-12 NOTE — Addendum Note (Signed)
Addended by: Telford Nab on: 10/12/2015 08:44 AM   Modules accepted: Orders

## 2015-10-12 NOTE — Telephone Encounter (Signed)
Can we call calcium prescription to pharmacy.

## 2015-10-12 NOTE — Telephone Encounter (Signed)
rx escribed to pharmacy

## 2015-10-18 ENCOUNTER — Inpatient Hospital Stay: Payer: Medicare Other

## 2015-10-18 ENCOUNTER — Inpatient Hospital Stay (HOSPITAL_BASED_OUTPATIENT_CLINIC_OR_DEPARTMENT_OTHER): Payer: Medicare Other | Admitting: Oncology

## 2015-10-18 ENCOUNTER — Encounter: Payer: Self-pay | Admitting: Oncology

## 2015-10-18 VITALS — BP 95/64 | HR 91 | Temp 96.3°F | Wt 172.1 lb

## 2015-10-18 DIAGNOSIS — R531 Weakness: Secondary | ICD-10-CM

## 2015-10-18 DIAGNOSIS — R5383 Other fatigue: Secondary | ICD-10-CM

## 2015-10-18 DIAGNOSIS — C3492 Malignant neoplasm of unspecified part of left bronchus or lung: Secondary | ICD-10-CM

## 2015-10-18 DIAGNOSIS — C7951 Secondary malignant neoplasm of bone: Secondary | ICD-10-CM

## 2015-10-18 DIAGNOSIS — R600 Localized edema: Secondary | ICD-10-CM

## 2015-10-18 DIAGNOSIS — D649 Anemia, unspecified: Secondary | ICD-10-CM

## 2015-10-18 DIAGNOSIS — J449 Chronic obstructive pulmonary disease, unspecified: Secondary | ICD-10-CM

## 2015-10-18 DIAGNOSIS — I10 Essential (primary) hypertension: Secondary | ICD-10-CM

## 2015-10-18 DIAGNOSIS — E7800 Pure hypercholesterolemia, unspecified: Secondary | ICD-10-CM

## 2015-10-18 DIAGNOSIS — I4892 Unspecified atrial flutter: Secondary | ICD-10-CM

## 2015-10-18 DIAGNOSIS — F329 Major depressive disorder, single episode, unspecified: Secondary | ICD-10-CM | POA: Diagnosis not present

## 2015-10-18 DIAGNOSIS — I4891 Unspecified atrial fibrillation: Secondary | ICD-10-CM

## 2015-10-18 DIAGNOSIS — C3432 Malignant neoplasm of lower lobe, left bronchus or lung: Secondary | ICD-10-CM

## 2015-10-18 LAB — CBC WITH DIFFERENTIAL/PLATELET
BASOS ABS: 0 10*3/uL (ref 0–0.1)
Eosinophils Absolute: 0 10*3/uL (ref 0–0.7)
Eosinophils Relative: 0 %
HCT: 21.7 % — ABNORMAL LOW (ref 40.0–52.0)
HEMOGLOBIN: 7.2 g/dL — AB (ref 13.0–18.0)
Lymphs Abs: 1.4 10*3/uL (ref 1.0–3.6)
MCH: 30.9 pg (ref 26.0–34.0)
MCHC: 32.9 g/dL (ref 32.0–36.0)
MCV: 93.7 fL (ref 80.0–100.0)
MONO ABS: 0.8 10*3/uL (ref 0.2–1.0)
Monocytes Relative: 9 %
NEUTROS ABS: 6.9 10*3/uL — AB (ref 1.4–6.5)
Platelets: 187 10*3/uL (ref 150–440)
RBC: 2.32 MIL/uL — AB (ref 4.40–5.90)
RDW: 30.3 % — ABNORMAL HIGH (ref 11.5–14.5)
WBC: 9 10*3/uL (ref 3.8–10.6)

## 2015-10-18 LAB — FERRITIN: Ferritin: 849 ng/mL — ABNORMAL HIGH (ref 24–336)

## 2015-10-18 LAB — IRON AND TIBC
Iron: 141 ug/dL (ref 45–182)
Saturation Ratios: 51 % — ABNORMAL HIGH (ref 17.9–39.5)
TIBC: 275 ug/dL (ref 250–450)
UIBC: 134 ug/dL

## 2015-10-18 LAB — PREPARE RBC (CROSSMATCH)

## 2015-10-18 LAB — COMPREHENSIVE METABOLIC PANEL
ALBUMIN: 2.5 g/dL — AB (ref 3.5–5.0)
ALK PHOS: 111 U/L (ref 38–126)
ALT: 27 U/L (ref 17–63)
ANION GAP: 9 (ref 5–15)
AST: 29 U/L (ref 15–41)
BUN: 20 mg/dL (ref 6–20)
CALCIUM: 8.5 mg/dL — AB (ref 8.9–10.3)
CO2: 30 mmol/L (ref 22–32)
CREATININE: 1.09 mg/dL (ref 0.61–1.24)
Chloride: 94 mmol/L — ABNORMAL LOW (ref 101–111)
Glucose, Bld: 303 mg/dL — ABNORMAL HIGH (ref 65–99)
Potassium: 4 mmol/L (ref 3.5–5.1)
SODIUM: 133 mmol/L — AB (ref 135–145)
TOTAL PROTEIN: 5.2 g/dL — AB (ref 6.5–8.1)
Total Bilirubin: 0.5 mg/dL (ref 0.3–1.2)

## 2015-10-18 LAB — MAGNESIUM: MAGNESIUM: 1.9 mg/dL (ref 1.7–2.4)

## 2015-10-18 LAB — ABO/RH: ABO/RH(D): O NEG

## 2015-10-18 MED ORDER — SODIUM CHLORIDE 0.9 % IV SOLN
250.0000 mL | Freq: Once | INTRAVENOUS | Status: AC
  Administered 2015-10-18: 250 mL via INTRAVENOUS
  Filled 2015-10-18: qty 250

## 2015-10-18 MED ORDER — DIPHENHYDRAMINE HCL 25 MG PO CAPS
25.0000 mg | ORAL_CAPSULE | Freq: Once | ORAL | Status: AC
  Administered 2015-10-18: 25 mg via ORAL
  Filled 2015-10-18: qty 1

## 2015-10-18 MED ORDER — FUROSEMIDE 10 MG/ML IJ SOLN
20.0000 mg | Freq: Once | INTRAMUSCULAR | Status: AC
  Administered 2015-10-18: 20 mg via INTRAVENOUS
  Filled 2015-10-18: qty 2

## 2015-10-18 MED ORDER — HEPARIN SOD (PORK) LOCK FLUSH 100 UNIT/ML IV SOLN
500.0000 [IU] | Freq: Every day | INTRAVENOUS | Status: AC | PRN
  Administered 2015-10-18: 500 [IU]
  Filled 2015-10-18: qty 5

## 2015-10-18 MED ORDER — ACETAMINOPHEN 325 MG PO TABS
650.0000 mg | ORAL_TABLET | Freq: Once | ORAL | Status: AC
  Administered 2015-10-18: 650 mg via ORAL
  Filled 2015-10-18: qty 2

## 2015-10-18 MED ORDER — SODIUM CHLORIDE 0.9 % IJ SOLN
10.0000 mL | INTRAMUSCULAR | Status: DC | PRN
  Filled 2015-10-18: qty 10

## 2015-10-19 LAB — TYPE AND SCREEN
ABO/RH(D): O NEG
Antibody Screen: NEGATIVE
UNIT DIVISION: 0

## 2015-10-22 ENCOUNTER — Encounter: Payer: Self-pay | Admitting: Oncology

## 2015-10-22 NOTE — Progress Notes (Signed)
[place] @ [facility] Telephone:(336) [phone]  Fax:(336) [place] OB: 04/12/1941  MR#: [id number]  SLH#:[id number]  Patient Care Team: Tracie Harrier, MD as PCP - General (Internal Medicine) Corey Skains, MD as Consulting Physician (Internal Medicine)  Oncology History   1.  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  Patient has finished radiation therapy to the lung mass (palliative therapy because of hemoptysis) in August of 2016 4.  Patient was started on carboplatinum and Alimta from August 16 2015  CHIEF COMPLAINT:  Patient is feeling extremely weak and tired.  Lack of energy.  Very depressed and wants to quit all chemotherapy  Being managed by cardiologist and changes in medication has been made for atrial flutter with fast ventricular response.  Swelling of the lower extremities improving.  No cough.  Shortness of breath on minimal exertion.  According to family Patient has lost interest and surrounding. 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    Oncology Flowsheet 08/10/2015 08/16/2015 09/06/2015 09/13/2015 09/27/2015 10/04/2015 10/18/2015  Day, Cycle - Day 1, 1 Day 1, Cycle 2 - Day 1, Cycle 3 - -  CARBOplatin (PARAPLATIN) IV - 460 mg 460 mg - 460 mg - -  cyanocobalamin ((VITAMIN B-12)) IM 1,000 mcg - 1,000 mcg - 1,000 mcg - -  denosumab (XGEVA) [place] - 120 mg - 120 mg - - -  dexamethasone (DECADRON) IJ - - - - - - -  dexamethasone (DECADRON) IV - [ 12 mg ] [ 12 mg ] - [ 12 mg ] - -  diphenhydrAMINE (BENADRYL) PO - - - - - - 25 mg  fosaprepitant (EMEND) IV - [ 150 mg ] [ 150 mg ] - [ 150 mg ] - -  methylPREDNISolone sodium succinate 125 mg/2 mL (SOLU-MEDROL) IV - - - - - 125 mg -  ondansetron (ZOFRAN) IV - - - - - - -  palonosetron (ALOXI) IV - 0.25 mg 0.25 mg - 0.25  mg - -  PEMEtrexed (ALIMTA) IV - 500 mg/m2 500 mg/m2 - 500 mg/m2 - -   1.  Patient has atrial fibrillation on a long-term xeralto 2.  History of cerebrovascular accident when he was taken off of xeralto 3.  Hypercholesterolemia 4.  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  Somewhat improvement since yesterday had a CT scan of the chest done. Increasing shortness of breath EKG revealed atrial fibrillation with fast ventricular response CT scan has been reviewed independently  REVIEW OF SYSTEMS:    general status: Patient is feeling weak and tired.  No change in a performance status.  No chills.  No fever. HEENT  difficulty swallowing S Benson in history of present illness Lungs: No cough or shortness of breath Cardiac: No chest pain or paroxysmal nocturnal dyspnea GI: No nausea no vomiting no diarrhea no abdominal pain Skin: No rash Lower extremity no swelling Neurological system: No tingling.  No numbness.  No other focal signs Musculoskeletal system no bony pains  GU: Patient had a high PSA but biopsy was negative All other 12 systems have been reviewed As per HPI.  Otherwise, a complete review of systems is negatve.  PAST MEDICAL HISTORY: Past Medical History  Diagnosis Date  . Hypertension   . Pneumonia   . Cancer (HCC)     lung mass  . Stroke ([place])   . COPD (chronic obstructive pulmonary disease) ([place])   . Carcinoma of left lung ([place]) 07/05/2015  . Anorexia 03/2016  . Stroke [facility]) 03/2016    PAST SURGICAL HISTORY:  had a cataract surgery in the past.  Has a large prostate biopsy was done.   FAMILY HISTORY There is no significant family history of breast cancer, ovarian cancer, colon cancer  grandmother had stomach cancer family history of heart disease     ADVANCED DIRECTIVES:  Patient does have  advance healthcare directive, Patient   does not desire to make any changes  HEALTH MAINTENANCE: Social History  Substance Use Topics  . Smoking status: Former Smoker    Types: Cigarettes    Quit date: 06/27/2005  . Smokeless tobacco: None  . Alcohol Use: No     Allergies  Allergen Reactions  . Celebrex [Celecoxib]   . Zolpidem Tartrate Anxiety    Current Outpatient Prescriptions  Medication Sig Dispense Refill  . albuterol (PROVENTIL) (2.5 MG/3ML) 0.083% nebulizer solution Take 3 mLs (2.5 mg total) by nebulization every 4 (four) hours as needed for wheezing or shortness of breath. 75 mL 12  . Calcium 600-200 MG-UNIT tablet Take 1 tablet by mouth 2 (two) times daily. 60 tablet 3  . citalopram (CELEXA) 20 MG tablet Take 1 tablet (20 mg total) by mouth daily. 30 tablet 3  . dexamethasone (DECADRON) 4 MG tablet Take 1 tab two times a day the day before Alimta chemo. Take 2 tabs two times a day starting the day after chemo for 3 days. 30 tablet 1  . Fluticasone-Salmeterol (ADVAIR DISKUS) 250-50 MCG/DOSE AEPB Inhale 1 puff into the lungs 2 (two) times daily.    . folic acid (FOLVITE) 1 MG tablet Take 1 tablet (1 mg total) by mouth daily. 30 tablet 3  . furosemide (LASIX) 40 MG tablet Take 2 tabs PO in the AM for 5 days then resume 1 tab PO daily 30 tablet 3  . lidocaine-prilocaine (EMLA) cream Apply 1 application topically as needed. 30 g 3  . metoprolol (LOPRESSOR) 50 MG tablet Take by mouth.    . ondansetron (ZOFRAN) 8 MG tablet Take 1 tablet (8 mg total) by mouth 2 (two) times daily. Start the day after chemo for 3 days. Then take as needed for nausea or vomiting. 30 tablet 1  . predniSONE (DELTASONE) 20 MG tablet TAKE ONE TABLET BY MOUTH ONCE DAILY WITH BREAKFAST 30 tablet 0  . rivaroxaban (XARELTO) 20 MG TABS tablet Take 20 mg by mouth daily with supper.    . VENTOLIN HFA 108 (90 BASE) MCG/ACT inhaler Inhale 1 puff into the lungs every 4 (four) hours as needed for wheezing or  shortness of breath.   0   No current facility-administered medications for this visit.    OBJECTIVE: PHYSICAL EXAM:GENERAL:  Well developed, well nourished, sitting comfortably in the exam room in no acute distress.  Patient is walking with the help of cane  MENTAL STATUS:  Alert and oriented to person, place and time. HEAD.  Ecchymosis which is resolving on the face. EYES:    Pupils equal round and reactive to light and accomodation.  No conjunctivitis or scleral icterus. ENT:  There is a small area of redness and candidiasis on  the left side of the throat RESPIRATORY:  Diminished and entry on both sides.  Emphysematous chest.  His dullness on percussion on the left lower chest .  Diminished air entry on the right side Course crepitation with at the left lower base CARDIOVASCULAR:  Regular rate and rhythm without murmur, rub or gallop. Abdominal exam revealed normal bowel sounds. The abdomen was soft, non-tender, and without masses, organomegaly, or appreciable enlargement of the abdominal aorta. BACK:  No CVA tenderness.  No tenderness on percussion of the back or rib cage.  Intermittent tenderness in the left hip SKIN: Poor skin turgor EXTREMITIES: 1+ swelling of both lower extremity LYMPH NODES: No palpable cervical, supraclavicular, axillary or inguinal adenopathy  NEUROLOGICAL: She has residual weakness on the right side walking with the help of cane PSYCH:  Appropriate.very apprehensive.    Filed Vitals:   10/18/15 0945  BP: 95/64  Pulse: 91  Temp: 96.3 F (35.7 C)     Body mass index is 24.69 kg/(m^2).    ECOG FS:1 - Symptomatic but completely ambulatory  LAB RESULTS:  Infusion on 10/18/2015  Component Date Value Ref Range Status  . ABO/RH(D) 10/18/2015 O NEG   Final  . Antibody Screen 10/18/2015 NEG   Final  . Sample Expiration 10/18/2015 10/21/2015   Final  . Unit Number 10/18/2015 [id number]   Final  . Blood Component Type 10/18/2015 RBC LR PHER2   Final  . Unit  division 10/18/2015 00   Final  . Status of Unit 10/18/2015 ISSUED,FINAL   Final  . Transfusion Status 10/18/2015 OK TO TRANSFUSE   Final  . Crossmatch Result 10/18/2015 Compatible   Final  . Order Confirmation 10/18/2015 ORDER PROCESSED BY BLOOD BANK   Final  . Ferritin 10/18/2015 849* 24 - 336 ng/mL Final  . Iron 10/18/2015 141  45 - 182 ug/dL Final  . TIBC 10/18/2015 275  250 - 450 ug/dL Final  . Saturation Ratios 10/18/2015 51* 17.9 - 39.5 % Final  . UIBC 10/18/2015 134   Final  . ABO/RH(D) 10/18/2015 O NEG   Final  Appointment on 10/18/2015  Component Date Value Ref Range Status  . WBC 10/18/2015 9.0  3.8 - 10.6 K/uL Final   A-LINE DRAW  . RBC 10/18/2015 2.32* 4.40 - 5.90 MIL/uL Final  . Hemoglobin 10/18/2015 7.2* 13.0 - 18.0 g/dL Final  . HCT 10/18/2015 21.7* 40.0 - 52.0 % Final  . MCV 10/18/2015 93.7  80.0 - 100.0 fL Final  . MCH 10/18/2015 30.9  26.0 - 34.0 pg Final  . MCHC 10/18/2015 32.9  32.0 - 36.0 g/dL Final  . RDW 10/18/2015 30.3* 11.5 - 14.5 % Final  . Platelets 10/18/2015 187  150 - 440 K/uL Final  . Neutrophils Relative % 10/18/2015 76%   Final  . Neutro Abs 10/18/2015 6.9* 1.4 - 6.5 K/uL Final  . Lymphocytes Relative 10/18/2015 15%   Final  . Lymphs Abs 10/18/2015 1.4  1.0 - 3.6 K/uL Final  . Monocytes Relative 10/18/2015 9%   Final  . Monocytes Absolute 10/18/2015 0.8  0.2 - 1.0 K/uL Final  . Eosinophils Relative 10/18/2015 0%   Final  . Eosinophils Absolute 10/18/2015 0.0  0 - 0.7 K/uL Final  . Basophils Relative 10/18/2015 0%   Final  . Basophils Absolute 10/18/2015 0.0  0 - 0.1 K/uL Final  . Smear Review 10/18/2015 FEW NRBCS SEEN ON SMEAR SCAN   Corrected  . Sodium 10/18/2015 133* 135 - 145 mmol/L Final  . Potassium 10/18/2015  4.0  3.5 - 5.1 mmol/L Final  . Chloride 10/18/2015 94* 101 - 111 mmol/L Final  . CO2 10/18/2015 30  22 - 32 mmol/L Final  . Glucose, Bld 10/18/2015 303* 65 - 99 mg/dL Final  . BUN 10/18/2015 20  6 - 20 mg/dL Final  . Creatinine, Ser  10/18/2015 1.09  0.61 - 1.24 mg/dL Final  . Calcium 10/18/2015 8.5* 8.9 - 10.3 mg/dL Final  . Total Protein 10/18/2015 5.2* 6.5 - 8.1 g/dL Final  . Albumin 10/18/2015 2.5* 3.5 - 5.0 g/dL Final  . AST 10/18/2015 29  15 - 41 U/L Final  . ALT 10/18/2015 27  17 - 63 U/L Final  . Alkaline Phosphatase 10/18/2015 111  38 - 126 U/L Final  . Total Bilirubin 10/18/2015 0.5  0.3 - 1.2 mg/dL Final  . GFR calc non Af Amer 10/18/2015 >60  >60 mL/min Final  . GFR calc Af Amer 10/18/2015 >60  >60 mL/min Final   Comment: (NOTE) The eGFR has been calculated using the CKD EPI equation. This calculation has not been validated in all clinical situations. eGFR's persistently <60 mL/min signify possible Chronic Kidney Disease.   . Anion gap 10/18/2015 9  5 - 15 Final  . Magnesium 10/18/2015 1.9  1.7 - 2.4 mg/dL Final      ASSESSMENT:  Non-small cell carcinoma of lung left lower lobe.  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  Anemia which is multifocal assessment with iron studies Transfuse 1 unit of blood to see whether that would improve the patient's general condition Side effect of transfusion has been discussed and informed consent has been obtained 4.  Patient continues to be very depressed so Celexa has been increased to 40 mg 5, decision regarding either continuing chemotherapy or changing to immunotherapy would be made next week depending on patient's desire and overall general condition Delton See can be continued next week Prolonged discussion with patient and family regarding pros and cons of continuing present approach with the treatment versus newer approach with immunotherapy versus completely quitting treatment and going with hospice and palliative care.  Tumor specimen with PDL 1 assessment is pending Carcinoma of left lung   Staging form: Lung, AJCC 7th  Edition     Clinical: Stage 4 (T4, N2.  m1) - Signed by Forest Gleason, MD on 07/05/2015  Forest Gleason, MD   10/22/2015 8:16 AM

## 2015-10-25 ENCOUNTER — Inpatient Hospital Stay (HOSPITAL_BASED_OUTPATIENT_CLINIC_OR_DEPARTMENT_OTHER): Payer: Medicare Other | Admitting: Oncology

## 2015-10-25 ENCOUNTER — Inpatient Hospital Stay: Payer: Medicare Other | Admitting: *Deleted

## 2015-10-25 ENCOUNTER — Encounter: Payer: Self-pay | Admitting: Oncology

## 2015-10-25 ENCOUNTER — Ambulatory Visit: Payer: Medicare Other | Admitting: Oncology

## 2015-10-25 ENCOUNTER — Other Ambulatory Visit: Payer: Medicare Other | Admitting: *Deleted

## 2015-10-25 VITALS — BP 92/65 | HR 53 | Temp 96.7°F | Wt 174.1 lb

## 2015-10-25 DIAGNOSIS — C3492 Malignant neoplasm of unspecified part of left bronchus or lung: Secondary | ICD-10-CM

## 2015-10-25 DIAGNOSIS — Z923 Personal history of irradiation: Secondary | ICD-10-CM

## 2015-10-25 DIAGNOSIS — Z9221 Personal history of antineoplastic chemotherapy: Secondary | ICD-10-CM | POA: Diagnosis not present

## 2015-10-25 DIAGNOSIS — C7951 Secondary malignant neoplasm of bone: Secondary | ICD-10-CM

## 2015-10-25 DIAGNOSIS — R600 Localized edema: Secondary | ICD-10-CM

## 2015-10-25 DIAGNOSIS — R5383 Other fatigue: Secondary | ICD-10-CM

## 2015-10-25 DIAGNOSIS — I4891 Unspecified atrial fibrillation: Secondary | ICD-10-CM

## 2015-10-25 DIAGNOSIS — R531 Weakness: Secondary | ICD-10-CM

## 2015-10-25 DIAGNOSIS — C3432 Malignant neoplasm of lower lobe, left bronchus or lung: Secondary | ICD-10-CM | POA: Diagnosis not present

## 2015-10-25 DIAGNOSIS — R630 Anorexia: Secondary | ICD-10-CM

## 2015-10-25 DIAGNOSIS — J449 Chronic obstructive pulmonary disease, unspecified: Secondary | ICD-10-CM

## 2015-10-25 DIAGNOSIS — D649 Anemia, unspecified: Secondary | ICD-10-CM

## 2015-10-25 DIAGNOSIS — F329 Major depressive disorder, single episode, unspecified: Secondary | ICD-10-CM

## 2015-10-25 LAB — CBC WITH DIFFERENTIAL/PLATELET
Basophils Absolute: 0 10*3/uL (ref 0–0.1)
Basophils Relative: 0 %
EOS ABS: 0 10*3/uL (ref 0–0.7)
EOS PCT: 0 %
HCT: 28.5 % — ABNORMAL LOW (ref 40.0–52.0)
Hemoglobin: 9.4 g/dL — ABNORMAL LOW (ref 13.0–18.0)
LYMPHS ABS: 0.5 10*3/uL — AB (ref 1.0–3.6)
Lymphocytes Relative: 5 %
MCH: 31.9 pg (ref 26.0–34.0)
MCHC: 32.8 g/dL (ref 32.0–36.0)
MCV: 97.1 fL (ref 80.0–100.0)
Monocytes Absolute: 0.4 10*3/uL (ref 0.2–1.0)
Monocytes Relative: 4 %
Neutro Abs: 9.6 10*3/uL — ABNORMAL HIGH (ref 1.4–6.5)
Neutrophils Relative %: 91 %
PLATELETS: 168 10*3/uL (ref 150–440)
RBC: 2.94 MIL/uL — AB (ref 4.40–5.90)
RDW: 30.5 % — AB (ref 11.5–14.5)
WBC: 10.6 10*3/uL (ref 3.8–10.6)

## 2015-10-25 LAB — CALCIUM: CALCIUM: 8 mg/dL — AB (ref 8.9–10.3)

## 2015-10-25 MED ORDER — DENOSUMAB 120 MG/1.7ML ~~LOC~~ SOLN
120.0000 mg | Freq: Once | SUBCUTANEOUS | Status: AC
  Administered 2015-10-25: 120 mg via SUBCUTANEOUS
  Filled 2015-10-25: qty 1.7

## 2015-10-25 NOTE — Progress Notes (Signed)
[place] @ [facility] Telephone:(336) [phone]  Fax:(336) [place] OB: 02/06/1941  MR#: [id number]  OBS#:[id number]  Patient Care Team: Tracie Harrier, MD as PCP - General (Internal Medicine) Corey Skains, MD as Consulting Physician (Internal Medicine)  Oncology History   1.  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  Patient has finished radiation therapy to the lung mass (palliative therapy because of hemoptysis) in August of 2016 4.  Patient was started on carboplatinum and Alimta from August 16 2015  CHIEF COMPLAINT:  Patient is feeling extremely weak and tired.  Lack of energy.  Very depressed and wants to quit all chemotherapy  Being managed by cardiologist and changes in medication has been made for atrial flutter with fast ventricular response.  Swelling of the lower extremities improving.  No cough.  Shortness of breath on minimal exertion.  According to family Patient has lost interest and surrounding.  Patient is feeling somewhat stronger and less depressed.  Family continues to report poor activity.  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    Oncology Flowsheet 08/10/2015 08/16/2015 09/06/2015 09/13/2015 09/27/2015 10/04/2015 10/18/2015  Day, Cycle - Day 1, 1 Day 1, Cycle 2 - Day 1, Cycle 3 - -  CARBOplatin (PARAPLATIN) IV - 460 mg 460 mg - 460 mg - -  cyanocobalamin ((VITAMIN B-12)) IM 1,000 mcg - 1,000 mcg - 1,000 mcg - -  denosumab (XGEVA) [place] - 120 mg - 120 mg - - -  dexamethasone (DECADRON) IJ - - - - - - -  dexamethasone (DECADRON) IV - [ 12 mg ] [ 12 mg ] - [ 12 mg ] - -  diphenhydrAMINE (BENADRYL) PO - - - - - - 25 mg  fosaprepitant (EMEND) IV - [ 150 mg ] [ 150 mg ] - [ 150 mg ] - -  methylPREDNISolone  sodium succinate 125 mg/2 mL (SOLU-MEDROL) IV - - - - - 125 mg -  ondansetron (ZOFRAN) IV - - - - - - -  palonosetron (ALOXI) IV - 0.25 mg 0.25 mg - 0.25 mg - -  PEMEtrexed (ALIMTA) IV - 500 mg/m2 500 mg/m2 - 500 mg/m2 - -   1.  Patient has atrial fibrillation on a long-term xeralto 2.  History of cerebrovascular accident when he was taken off of xeralto 3.  Hypercholesterolemia 4.  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  Somewhat improvement since yesterday had a CT scan of the chest done. Increasing shortness of breath EKG revealed atrial fibrillation with fast ventricular response CT scan has been reviewed independently  REVIEW OF SYSTEMS:    general status: Patient is feeling weak and tired.  No change in a performance status.  No chills.  No fever. HEENT  difficulty swallowing S Benson in history of present illness Lungs: No cough or shortness of breath Cardiac: No chest pain or paroxysmal nocturnal dyspnea GI: No nausea no vomiting no diarrhea no abdominal pain Skin: No rash Lower extremity no swelling Neurological system: No tingling.  No numbness.  No other focal signs  Musculoskeletal system no bony pains  GU: Patient had a high PSA but biopsy was negative All other 12 systems have been reviewed As per HPI. Otherwise, a complete review of systems is negatve.  PAST MEDICAL HISTORY: Past Medical History  Diagnosis Date  . Hypertension   . Pneumonia   . Cancer (HCC)     lung mass  . Stroke ([place])   . COPD (chronic obstructive pulmonary disease) ([place])   . Carcinoma of left lung ([place]) 07/05/2015  . Anorexia 03/2016  . Stroke [facility]) 03/2016    PAST SURGICAL HISTORY:  had a cataract surgery in the past.  Has a large prostate biopsy was done.   FAMILY HISTORY There is no significant family history of breast  cancer, ovarian cancer, colon cancer  grandmother had stomach cancer family history of heart disease     ADVANCED DIRECTIVES:  Patient does have advance healthcare directive, Patient   does not desire to make any changes  HEALTH MAINTENANCE: Social History  Substance Use Topics  . Smoking status: Former Smoker    Types: Cigarettes    Quit date: 06/27/2005  . Smokeless tobacco: None  . Alcohol Use: No     Allergies  Allergen Reactions  . Celebrex [Celecoxib]   . Zolpidem Tartrate Anxiety    Current Outpatient Prescriptions  Medication Sig Dispense Refill  . albuterol (PROVENTIL) (2.5 MG/3ML) 0.083% nebulizer solution Take 3 mLs (2.5 mg total) by nebulization every 4 (four) hours as needed for wheezing or shortness of breath. 75 mL 12  . Calcium 600-200 MG-UNIT tablet Take 1 tablet by mouth 2 (two) times daily. 60 tablet 3  . citalopram (CELEXA) 20 MG tablet Take 1 tablet (20 mg total) by mouth daily. 30 tablet 3  . dexamethasone (DECADRON) 4 MG tablet Take 1 tab two times a day the day before Alimta chemo. Take 2 tabs two times a day starting the day after chemo for 3 days. 30 tablet 1  . Fluticasone-Salmeterol (ADVAIR DISKUS) 250-50 MCG/DOSE AEPB Inhale 1 puff into the lungs 2 (two) times daily.    . folic acid (FOLVITE) 1 MG tablet Take 1 tablet (1 mg total) by mouth daily. 30 tablet 3  . furosemide (LASIX) 40 MG tablet Take 2 tabs PO in the AM for 5 days then resume 1 tab PO daily 30 tablet 3  . lidocaine-prilocaine (EMLA) cream Apply 1 application topically as needed. 30 g 3  . metoprolol (LOPRESSOR) 50 MG tablet Take by mouth.    . ondansetron (ZOFRAN) 8 MG tablet Take 1 tablet (8 mg total) by mouth 2 (two) times daily. Start the day after chemo for 3 days. Then take as needed for nausea or vomiting. 30 tablet 1  . predniSONE (DELTASONE) 20 MG tablet TAKE ONE TABLET BY MOUTH ONCE DAILY WITH BREAKFAST 30 tablet 0  . rivaroxaban (XARELTO) 20 MG TABS tablet Take 20 mg by mouth  daily with supper.    . VENTOLIN HFA 108 (90 BASE) MCG/ACT inhaler Inhale 1 puff into the lungs every 4 (four) hours as needed for wheezing or shortness of breath.   0   No current facility-administered medications for this visit.    OBJECTIVE: PHYSICAL EXAM:GENERAL:  Well developed, well nourished, sitting comfortably in the exam room in no acute distress.  Patient is walking with the help of cane  MENTAL STATUS:  Alert and oriented to person, place and time. HEAD.  Ecchymosis which is resolving on the face. EYES:    Pupils  equal round and reactive to light and accomodation.  No conjunctivitis or scleral icterus. ENT:  There is a small area of redness and candidiasis on the left side of the throat RESPIRATORY:  Diminished and entry on both sides.  Emphysematous chest.  His dullness on percussion on the left lower chest .  Diminished air entry on the right side Course crepitation with at the left lower base CARDIOVASCULAR:  Regular rate and rhythm without murmur, rub or gallop. Abdominal exam revealed normal bowel sounds. The abdomen was soft, non-tender, and without masses, organomegaly, or appreciable enlargement of the abdominal aorta. BACK:  No CVA tenderness.  No tenderness on percussion of the back or rib cage.  Intermittent tenderness in the left hip SKIN: Poor skin turgor EXTREMITIES: 1+ swelling of both lower extremity LYMPH NODES: No palpable cervical, supraclavicular, axillary or inguinal adenopathy  NEUROLOGICAL: She has residual weakness on the right side walking with the help of cane PSYCH:  Appropriate.very apprehensive.    Filed Vitals:   10/25/15 1338  BP: 92/65  Pulse: 53  Temp: 96.7 F (35.9 C)     Body mass index is 24.98 kg/(m^2).    ECOG FS:1 - Symptomatic but completely ambulatory  LAB RESULTS:  Clinical Support on 10/25/2015  Component Date Value Ref Range Status  . WBC 10/25/2015 10.6  3.8 - 10.6 K/uL Final  . RBC 10/25/2015 2.94* 4.40 - 5.90 MIL/uL Final    . Hemoglobin 10/25/2015 9.4* 13.0 - 18.0 g/dL Final  . HCT 10/25/2015 28.5* 40.0 - 52.0 % Final  . MCV 10/25/2015 97.1  80.0 - 100.0 fL Final  . MCH 10/25/2015 31.9  26.0 - 34.0 pg Final  . MCHC 10/25/2015 32.8  32.0 - 36.0 g/dL Final  . RDW 10/25/2015 30.5* 11.5 - 14.5 % Final  . Platelets 10/25/2015 168  150 - 440 K/uL Final  . Neutrophils Relative % 10/25/2015 91   Final  . Neutro Abs 10/25/2015 9.6* 1.4 - 6.5 K/uL Final  . Lymphocytes Relative 10/25/2015 5   Final  . Lymphs Abs 10/25/2015 0.5* 1.0 - 3.6 K/uL Final  . Monocytes Relative 10/25/2015 4   Final  . Monocytes Absolute 10/25/2015 0.4  0.2 - 1.0 K/uL Final  . Eosinophils Relative 10/25/2015 0   Final  . Eosinophils Absolute 10/25/2015 0.0  0 - 0.7 K/uL Final  . Basophils Relative 10/25/2015 0   Final  . Basophils Absolute 10/25/2015 0.0  0 - 0.1 K/uL Final  . Calcium 10/25/2015 8.0* 8.9 - 10.3 mg/dL Final      ASSESSMENT:  Non-small cell carcinoma of lung left lower lobe.  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  Anemia which is multifocal assessment with iron studies Improved with hemoglobin of 9.4 after transfusion. Weakness somewhat improved. We would like to observe this patient without any treatment over the Thanksgiving week. Recheck CT scan or a PET scan for assessment of the tumor Reevaluate patient after one week Duration of visit is 25 minutes and 50% of time was spent discussing VARIOUS  options or alleviating care with other physicians social worker Tumor specimen with PDL 1 assessment is pending Carcinoma of left lung   Staging form: Lung, AJCC 7th Edition     Clinical: Stage 4 (T4, N2.  m1) - Signed by Forest Gleason, MD on 07/05/2015  Forest Gleason, MD   10/25/2015  2:21 PM

## 2015-10-30 ENCOUNTER — Other Ambulatory Visit: Payer: Self-pay | Admitting: Oncology

## 2015-10-31 ENCOUNTER — Encounter: Payer: Self-pay | Admitting: Oncology

## 2015-11-01 ENCOUNTER — Telehealth: Payer: Self-pay | Admitting: *Deleted

## 2015-11-01 DIAGNOSIS — C3492 Malignant neoplasm of unspecified part of left bronchus or lung: Secondary | ICD-10-CM

## 2015-11-01 MED ORDER — CITALOPRAM HYDROBROMIDE 20 MG PO TABS: 20.0000 mg | ORAL_TABLET | Freq: Two times a day (BID) | ORAL | Status: AC

## 2015-11-01 NOTE — Telephone Encounter (Signed)
Escribed

## 2015-11-09 ENCOUNTER — Ambulatory Visit
Admission: RE | Admit: 2015-11-09 | Discharge: 2015-11-09 | Disposition: A | Discharge status: Home / Self Care | Payer: Medicare Other | Source: Ambulatory Visit | Attending: Oncology | Admitting: Oncology

## 2015-11-09 ENCOUNTER — Telehealth: Payer: Self-pay | Admitting: *Deleted

## 2015-11-09 DIAGNOSIS — Z0189 Encounter for other specified special examinations: Secondary | ICD-10-CM | POA: Diagnosis present

## 2015-11-09 DIAGNOSIS — C3492 Malignant neoplasm of unspecified part of left bronchus or lung: Secondary | ICD-10-CM | POA: Diagnosis present

## 2015-11-09 LAB — GLUCOSE, CAPILLARY
GLUCOSE-CAPILLARY: 204 mg/dL — AB (ref 65–99)
Glucose-Capillary: 224 mg/dL — ABNORMAL HIGH (ref 65–99)

## 2015-11-09 NOTE — Telephone Encounter (Signed)
Per Dr Jeb Levering, decrease prednisone to 10 mg 12/2 then stop. Come in on Monday before PET for Glucose check and if needed insulin. I called [facility] to see if his appt can be moved with another patient as it is scheduled for 7:30 before the office opens. They are gone for the day and I will call them back tomorrow. I called patient and discussed with wife the change in Predisone to 10 mg on 12/2 then stop, she stated she was writing it down and read it back to me. I told her I would contac there tomorrow regarding possible lab before scan on Monday, but need to discuss with PET first. She will await my call tomorrow

## 2015-11-09 NOTE — Telephone Encounter (Signed)
Unable to perform PET today due to increased BS 220, Has rescheduled him to Monday morning and is asking what can be done to bring his glucose down, he is on Prednisone 20 mg daily

## 2015-11-10 NOTE — Telephone Encounter (Signed)
PET unable to change appt time, also stated that if we had to give insulin, they would have to wait 4 hours after given to do the PET. Per L Herring, AGNP-C we will skip having the patient come in for lab check and go to PET as scheduled at 730 Monday. I spoke with Mrs Stockley and told her we will keep aapt as scheduled and hope that his sugar will be down. She stated they will make diet adjustments at home also to help keep his sugar level down as well

## 2015-11-13 ENCOUNTER — Ambulatory Visit
Admission: RE | Admit: 2015-11-13 | Discharge: 2015-11-13 | Disposition: A | Discharge status: Home / Self Care | Payer: Medicare Other | Source: Ambulatory Visit | Attending: Oncology | Admitting: Oncology

## 2015-11-13 ENCOUNTER — Inpatient Hospital Stay: Payer: Medicare Other | Attending: Oncology | Admitting: Oncology

## 2015-11-13 ENCOUNTER — Encounter: Payer: Self-pay | Admitting: Oncology

## 2015-11-13 VITALS — BP 102/69 | HR 74 | Temp 96.8°F | Resp 18 | Wt 172.3 lb

## 2015-11-13 DIAGNOSIS — C3492 Malignant neoplasm of unspecified part of left bronchus or lung: Secondary | ICD-10-CM | POA: Insufficient documentation

## 2015-11-13 DIAGNOSIS — F329 Major depressive disorder, single episode, unspecified: Secondary | ICD-10-CM | POA: Diagnosis not present

## 2015-11-13 DIAGNOSIS — R0609 Other forms of dyspnea: Secondary | ICD-10-CM | POA: Diagnosis not present

## 2015-11-13 DIAGNOSIS — Z79899 Other long term (current) drug therapy: Secondary | ICD-10-CM | POA: Diagnosis not present

## 2015-11-13 DIAGNOSIS — M899 Disorder of bone, unspecified: Secondary | ICD-10-CM | POA: Insufficient documentation

## 2015-11-13 DIAGNOSIS — Z5112 Encounter for antineoplastic immunotherapy: Secondary | ICD-10-CM | POA: Diagnosis present

## 2015-11-13 DIAGNOSIS — Z9981 Dependence on supplemental oxygen: Secondary | ICD-10-CM | POA: Diagnosis not present

## 2015-11-13 DIAGNOSIS — C3432 Malignant neoplasm of lower lobe, left bronchus or lung: Secondary | ICD-10-CM | POA: Diagnosis not present

## 2015-11-13 DIAGNOSIS — C7951 Secondary malignant neoplasm of bone: Secondary | ICD-10-CM | POA: Diagnosis not present

## 2015-11-13 DIAGNOSIS — R600 Localized edema: Secondary | ICD-10-CM | POA: Diagnosis not present

## 2015-11-13 DIAGNOSIS — I4891 Unspecified atrial fibrillation: Secondary | ICD-10-CM | POA: Diagnosis not present

## 2015-11-13 DIAGNOSIS — I10 Essential (primary) hypertension: Secondary | ICD-10-CM | POA: Insufficient documentation

## 2015-11-13 DIAGNOSIS — Z0189 Encounter for other specified special examinations: Secondary | ICD-10-CM | POA: Diagnosis present

## 2015-11-13 DIAGNOSIS — Z800 Family history of malignant neoplasm of digestive organs: Secondary | ICD-10-CM | POA: Insufficient documentation

## 2015-11-13 DIAGNOSIS — R918 Other nonspecific abnormal finding of lung field: Secondary | ICD-10-CM | POA: Insufficient documentation

## 2015-11-13 DIAGNOSIS — R5383 Other fatigue: Secondary | ICD-10-CM | POA: Diagnosis not present

## 2015-11-13 DIAGNOSIS — B370 Candidal stomatitis: Secondary | ICD-10-CM | POA: Diagnosis not present

## 2015-11-13 DIAGNOSIS — Z8673 Personal history of transient ischemic attack (TIA), and cerebral infarction without residual deficits: Secondary | ICD-10-CM | POA: Diagnosis not present

## 2015-11-13 DIAGNOSIS — Z7901 Long term (current) use of anticoagulants: Secondary | ICD-10-CM | POA: Diagnosis not present

## 2015-11-13 DIAGNOSIS — Z923 Personal history of irradiation: Secondary | ICD-10-CM | POA: Insufficient documentation

## 2015-11-13 DIAGNOSIS — Z87891 Personal history of nicotine dependence: Secondary | ICD-10-CM | POA: Diagnosis not present

## 2015-11-13 DIAGNOSIS — R531 Weakness: Secondary | ICD-10-CM | POA: Insufficient documentation

## 2015-11-13 DIAGNOSIS — E7800 Pure hypercholesterolemia, unspecified: Secondary | ICD-10-CM | POA: Insufficient documentation

## 2015-11-13 DIAGNOSIS — J90 Pleural effusion, not elsewhere classified: Secondary | ICD-10-CM | POA: Insufficient documentation

## 2015-11-13 DIAGNOSIS — J449 Chronic obstructive pulmonary disease, unspecified: Secondary | ICD-10-CM | POA: Diagnosis not present

## 2015-11-13 DIAGNOSIS — K8689 Other specified diseases of pancreas: Secondary | ICD-10-CM | POA: Insufficient documentation

## 2015-11-13 LAB — GLUCOSE, CAPILLARY: Glucose-Capillary: 159 mg/dL — ABNORMAL HIGH (ref 65–99)

## 2015-11-13 MED ORDER — FLUDEOXYGLUCOSE F - 18 (FDG) INJECTION
12.5400 | Freq: Once | INTRAVENOUS | Status: AC | PRN
  Administered 2015-11-13: 12.54 via INTRAVENOUS

## 2015-11-13 NOTE — Progress Notes (Signed)
[place] @ [facility] Telephone:(336) [phone]  Fax:(336) [place] OB: 1941/02/27  MR#: [id number]  YTK#:[id number]  Patient Care Team: Tracie Harrier, MD as PCP - General (Internal Medicine) Corey Skains, MD as Consulting Physician (Internal Medicine)  Oncology History   1.  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  Patient has finished radiation therapy to the lung mass (palliative therapy because of hemoptysis) in August of 2016 4.  Patient was started on carboplatinum and Alimta from August 16 2015  CHIEF COMPLAINT:  Patient is feeling extremely weak and tired.  Lack of energy.  Very depressed and wants to quit all chemotherapy  Patient continues to be extremely short of breath on exertion on 4 L of oxygen. Feeling extremely weak tired.  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    Oncology Flowsheet 08/16/2015 09/06/2015 09/13/2015 09/27/2015 10/04/2015 10/18/2015 10/25/2015  Day, Cycle Day 1, 1 Day 1, Cycle 2 - Day 1, Cycle 3 - - -  CARBOplatin (PARAPLATIN) IV 460 mg 460 mg - 460 mg - - -  cyanocobalamin ((VITAMIN B-12)) IM - 1,000 mcg - 1,000 mcg - - -  denosumab (XGEVA) [place] 120 mg - 120 mg - - - 120 mg  dexamethasone (DECADRON) IJ - - - - - - -  dexamethasone (DECADRON) IV [ 12 mg ] [ 12 mg ] - [ 12 mg ] - - -  diphenhydrAMINE (BENADRYL) PO - - - - - 25 mg -  fosaprepitant (EMEND) IV [ 150 mg ] [ 150 mg ] - [ 150 mg ] - - -  methylPREDNISolone sodium succinate 125 mg/2 mL (SOLU-MEDROL) IV - - - - 125 mg - -  ondansetron (ZOFRAN) IV - - - - - - -  palonosetron (ALOXI) IV 0.25 mg 0.25 mg - 0.25 mg - - -  PEMEtrexed (ALIMTA) IV 500 mg/m2 500 mg/m2 - 500 mg/m2 - - -   1.   Patient has atrial fibrillation on a long-term xeralto 2.  History of cerebrovascular accident when he was taken off of xeralto 3.  Hypercholesterolemia 4.  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  Somewhat improvement since yesterday had a CT scan of the chest done. Increasing shortness of breath EKG revealed atrial fibrillation with fast ventricular response CT scan has been reviewed independently  REVIEW OF SYSTEMS:    general status: Patient is feeling weak and tired.  No change in a performance status.  No chills.  No fever. HEENT  difficulty swallowing S Benson in history of present illness Lungs: No cough or shortness of breath Cardiac: No chest pain or paroxysmal nocturnal dyspnea GI: No nausea no vomiting no diarrhea no abdominal pain Skin: No rash Lower extremity no swelling Neurological system: No tingling.  No numbness.  No other focal signs Musculoskeletal system no bony pains  GU: Patient had a high PSA but biopsy was negative All other 12 systems have been reviewed As per HPI. Otherwise, a complete review of systems is  negatve.  PAST MEDICAL HISTORY: Past Medical History  Diagnosis Date  . Hypertension   . Pneumonia   . Cancer (HCC)     lung mass  . Stroke ([place])   . COPD (chronic obstructive pulmonary disease) ([place])   . Carcinoma of left lung ([place]) 07/05/2015  . Anorexia 03/2016  . Stroke [facility]) 03/2016    PAST SURGICAL HISTORY:  had a cataract surgery in the past.  Has a large prostate biopsy was done.   FAMILY HISTORY There is no significant family history of breast cancer, ovarian cancer, colon cancer  grandmother had stomach cancer family history of heart disease     ADVANCED DIRECTIVES:  Patient does have advance healthcare directive, Patient   does not desire to make any  changes  HEALTH MAINTENANCE: Social History  Substance Use Topics  . Smoking status: Former Smoker    Types: Cigarettes    Quit date: 06/27/2005  . Smokeless tobacco: None  . Alcohol Use: No     Allergies  Allergen Reactions  . Celebrex [Celecoxib]   . Zolpidem Tartrate Anxiety    Current Outpatient Prescriptions  Medication Sig Dispense Refill  . albuterol (PROVENTIL) (2.5 MG/3ML) 0.083% nebulizer solution Take 3 mLs (2.5 mg total) by nebulization every 4 (four) hours as needed for wheezing or shortness of breath. 75 mL 12  . Calcium 600-200 MG-UNIT tablet Take 1 tablet by mouth 2 (two) times daily. 60 tablet 3  . citalopram (CELEXA) 20 MG tablet Take 1 tablet (20 mg total) by mouth 2 (two) times daily. 60 tablet 3  . Fluticasone-Salmeterol (ADVAIR DISKUS) 250-50 MCG/DOSE AEPB Inhale 1 puff into the lungs 2 (two) times daily.    . folic acid (FOLVITE) 1 MG tablet Take 1 tablet (1 mg total) by mouth daily. 30 tablet 3  . furosemide (LASIX) 40 MG tablet Take 2 tabs PO in the AM for 5 days then resume 1 tab PO daily 30 tablet 3  . lidocaine-prilocaine (EMLA) cream Apply 1 application topically as needed. 30 g 3  . metoprolol (LOPRESSOR) 50 MG tablet Take by mouth.    . rivaroxaban (XARELTO) 20 MG TABS tablet Take 20 mg by mouth daily with supper.    . VENTOLIN HFA 108 (90 BASE) MCG/ACT inhaler Inhale 1 puff into the lungs every 4 (four) hours as needed for wheezing or shortness of breath.   0  . dexamethasone (DECADRON) 4 MG tablet   1   No current facility-administered medications for this visit.    OBJECTIVE: PHYSICAL EXAM:GENERAL:  Well developed, well nourished, sitting comfortably in the exam room in no acute distress.  Patient is walking with the help of cane  MENTAL STATUS:  Alert and oriented to person, place and time. HEAD.  Ecchymosis which is resolving on the face. EYES:    Pupils equal round and reactive to light and accomodation.  No conjunctivitis or scleral  icterus. ENT:  There is a small area of redness and candidiasis on the left side of the throat RESPIRATORY:  Diminished and entry on both sides.  Emphysematous chest.  His dullness on percussion on the left lower chest .  Diminished air entry on the right side Course crepitation with at the left lower base CARDIOVASCULAR:  Regular rate and rhythm without murmur, rub or gallop. Abdominal exam revealed normal bowel sounds. The abdomen was soft, non-tender, and without masses, organomegaly, or appreciable enlargement of the abdominal aorta. BACK:  No CVA tenderness.  No tenderness on percussion of  the back or rib cage.  Intermittent tenderness in the left hip SKIN: Poor skin turgor EXTREMITIES: 1+ swelling of both lower extremity LYMPH NODES: No palpable cervical, supraclavicular, axillary or inguinal adenopathy  NEUROLOGICAL: She has residual weakness on the right side walking with the help of cane PSYCH:  Appropriate.very apprehensive.    Filed Vitals:   11/13/15 1416  BP: 102/69  Pulse: 74  Temp: 96.8 F (36 C)  Resp: 18     Body mass index is 24.72 kg/(m^2).    ECOG FS:1 - Symptomatic but completely ambulatory  LAB RESULTS:  Hospital Outpatient Visit on 11/13/2015  Component Date Value Ref Range Status  . Glucose-Capillary 11/13/2015 159* 65 - 99 mg/dL Final   lab results from outside lab  [facility] lab reveals PDL 1 antibody less than 1%    ASSESSMENT:  Non-small cell carcinoma of lung left lower lobe.  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  There is extensive involvement of middle lobe of the lung but not metabolically hyperactive.  I reviewed PET scan independently and reviewed with the patient  2 daughters in the wife was present over option includes   1.  Bronchodilator therapy patient was advised to use nebulizer on a more regular basis Palliative and hospice care as chemotherapy did not  make any significant difference in overall response  Even though PDL receptors are less than 1% sometime anti-PDL 1 immunotherapy may work, [place] so possibility of using  tecentriq should be considered. Patient and family to decide all these options.  Total duration of visit was 30  minutes.  50% or more time was spent in counseling patient and family regarding prognosis and options of treatment and available resources Carcinoma of left lung   Staging form: Lung, AJCC 7th Edition     Clinical: Stage 4 (T4, N2.  m1) - Signed by Forest Gleason, MD on 07/05/2015  Forest Gleason, MD   11/13/2015 4:36 PM

## 2015-11-17 ENCOUNTER — Other Ambulatory Visit: Payer: Self-pay | Admitting: *Deleted

## 2015-11-17 DIAGNOSIS — C3492 Malignant neoplasm of unspecified part of left bronchus or lung: Secondary | ICD-10-CM

## 2015-11-20 ENCOUNTER — Inpatient Hospital Stay: Payer: Medicare Other

## 2015-11-20 ENCOUNTER — Inpatient Hospital Stay (HOSPITAL_BASED_OUTPATIENT_CLINIC_OR_DEPARTMENT_OTHER): Payer: Medicare Other | Admitting: Oncology

## 2015-11-20 ENCOUNTER — Encounter: Payer: Self-pay | Admitting: Oncology

## 2015-11-20 VITALS — BP 119/71 | HR 97 | Temp 96.0°F | Resp 20 | Wt 179.6 lb

## 2015-11-20 DIAGNOSIS — C7951 Secondary malignant neoplasm of bone: Secondary | ICD-10-CM | POA: Diagnosis not present

## 2015-11-20 DIAGNOSIS — F329 Major depressive disorder, single episode, unspecified: Secondary | ICD-10-CM

## 2015-11-20 DIAGNOSIS — J449 Chronic obstructive pulmonary disease, unspecified: Secondary | ICD-10-CM

## 2015-11-20 DIAGNOSIS — R600 Localized edema: Secondary | ICD-10-CM

## 2015-11-20 DIAGNOSIS — Z923 Personal history of irradiation: Secondary | ICD-10-CM | POA: Diagnosis not present

## 2015-11-20 DIAGNOSIS — R531 Weakness: Secondary | ICD-10-CM

## 2015-11-20 DIAGNOSIS — C3432 Malignant neoplasm of lower lobe, left bronchus or lung: Secondary | ICD-10-CM

## 2015-11-20 DIAGNOSIS — R5383 Other fatigue: Secondary | ICD-10-CM

## 2015-11-20 DIAGNOSIS — Z87891 Personal history of nicotine dependence: Secondary | ICD-10-CM

## 2015-11-20 DIAGNOSIS — R0609 Other forms of dyspnea: Secondary | ICD-10-CM

## 2015-11-20 DIAGNOSIS — Z9981 Dependence on supplemental oxygen: Secondary | ICD-10-CM

## 2015-11-20 DIAGNOSIS — B370 Candidal stomatitis: Secondary | ICD-10-CM

## 2015-11-20 DIAGNOSIS — C3492 Malignant neoplasm of unspecified part of left bronchus or lung: Secondary | ICD-10-CM

## 2015-11-20 DIAGNOSIS — Z5112 Encounter for antineoplastic immunotherapy: Secondary | ICD-10-CM | POA: Diagnosis not present

## 2015-11-20 LAB — COMPREHENSIVE METABOLIC PANEL
ALBUMIN: 2.2 g/dL — AB (ref 3.5–5.0)
ALT: 15 U/L — ABNORMAL LOW (ref 17–63)
ANION GAP: 6 (ref 5–15)
AST: 17 U/L (ref 15–41)
Alkaline Phosphatase: 84 U/L (ref 38–126)
BILIRUBIN TOTAL: 0.5 mg/dL (ref 0.3–1.2)
BUN: 12 mg/dL (ref 6–20)
CHLORIDE: 99 mmol/L — AB (ref 101–111)
CO2: 27 mmol/L (ref 22–32)
Calcium: 7.5 mg/dL — ABNORMAL LOW (ref 8.9–10.3)
Creatinine, Ser: 0.81 mg/dL (ref 0.61–1.24)
GFR calc Af Amer: >60 mL/min (ref >60)
GFR calc non Af Amer: >60 mL/min (ref >60)
GLUCOSE: 184 mg/dL — AB (ref 65–99)
POTASSIUM: 3.7 mmol/L (ref 3.5–5.1)
SODIUM: 132 mmol/L — AB (ref 135–145)
TOTAL PROTEIN: 5.6 g/dL — AB (ref 6.5–8.1)

## 2015-11-20 LAB — CBC WITH DIFFERENTIAL/PLATELET
BASOS ABS: 0 10*3/uL (ref 0–0.1)
Basophils Relative: 1 %
EOS PCT: 0 %
Eosinophils Absolute: 0 10*3/uL (ref 0–0.7)
HEMATOCRIT: 28.4 % — AB (ref 40.0–52.0)
Hemoglobin: 9.1 g/dL — ABNORMAL LOW (ref 13.0–18.0)
LYMPHS ABS: 0.6 10*3/uL — AB (ref 1.0–3.6)
LYMPHS PCT: 7 %
MCH: 30.9 pg (ref 26.0–34.0)
MCHC: 32.1 g/dL (ref 32.0–36.0)
MCV: 96.4 fL (ref 80.0–100.0)
MONO ABS: 0.6 10*3/uL (ref 0.2–1.0)
Monocytes Relative: 7 %
NEUTROS ABS: 6.9 10*3/uL — AB (ref 1.4–6.5)
Neutrophils Relative %: 85 %
Platelets: 243 10*3/uL (ref 150–440)
RBC: 2.95 MIL/uL — ABNORMAL LOW (ref 4.40–5.90)
RDW: 21.1 % — AB (ref 11.5–14.5)
WBC: 8.1 10*3/uL (ref 3.8–10.6)

## 2015-11-20 MED ORDER — HEPARIN SOD (PORK) LOCK FLUSH 100 UNIT/ML IV SOLN
500.0000 [IU] | Freq: Once | INTRAVENOUS | Status: AC | PRN
  Administered 2015-11-20: 500 [IU]
  Filled 2015-11-20: qty 5

## 2015-11-20 MED ORDER — SODIUM CHLORIDE 0.9 % IV SOLN
1200.0000 mg | Freq: Once | INTRAVENOUS | Status: AC
  Administered 2015-11-20: 1200 mg via INTRAVENOUS
  Filled 2015-11-20: qty 20

## 2015-11-20 MED ORDER — DENOSUMAB 120 MG/1.7ML ~~LOC~~ SOLN
120.0000 mg | Freq: Once | SUBCUTANEOUS | Status: AC
  Administered 2015-11-20: 120 mg via SUBCUTANEOUS
  Filled 2015-11-20: qty 1.7

## 2015-11-20 MED ORDER — SODIUM CHLORIDE 0.9 % IV SOLN
Freq: Once | INTRAVENOUS | Status: AC
  Administered 2015-11-20: 12:00:00 via INTRAVENOUS
  Filled 2015-11-20: qty 1000

## 2015-11-20 NOTE — Progress Notes (Signed)
[place] @ [facility] Telephone:(336) [phone]  Fax:(336) [place] OB: 1941/06/16  MR#: [id number]  BPZ#:[id number]  Patient Care Team: Tracie Harrier, MD as PCP - General (Internal Medicine) Corey Skains, MD as Consulting Physician (Internal Medicine)  Oncology History   1.  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  Patient has finished radiation therapy to the lung mass (palliative therapy because of hemoptysis) in August of 2016 4.  Patient was started on carboplatinum and Alimta from August 16 2015  CHIEF COMPLAINT:  Patient is feeling extremely weak and tired.  Lack of energy.  Very depressed and wants to quit all chemotherapy  Patient continues to be extremely short of breath on exertion on 4 L of oxygen. Feeling extremely weak tired.  Had a PET scan done patient is here to discuss the results and further options of therapy Patient continues to be extremely short of breath.  Can walk only 10-15 yards without getting short of breath and weak.  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    Oncology Flowsheet 08/16/2015 09/06/2015 09/13/2015 09/27/2015 10/04/2015 10/18/2015 10/25/2015  Day, Cycle Day 1, 1 Day 1, Cycle 2 - Day 1, Cycle 3 - - -  CARBOplatin (PARAPLATIN) IV 460 mg 460 mg - 460 mg - - -  cyanocobalamin ((VITAMIN B-12)) IM - 1,000 mcg - 1,000 mcg - - -  denosumab (XGEVA) [place] 120 mg - 120 mg - - - 120 mg  dexamethasone (DECADRON) IJ - - - - - - -  dexamethasone (DECADRON) IV [ 12 mg ] [ 12 mg ] - [ 12 mg ] - - -  diphenhydrAMINE (BENADRYL) PO - - - - - 25 mg -  fosaprepitant (EMEND) IV [ 150 mg ] [ 150 mg ] - [ 150 mg ] - - -  methylPREDNISolone sodium succinate 125 mg/2 mL  (SOLU-MEDROL) IV - - - - 125 mg - -  ondansetron (ZOFRAN) IV - - - - - - -  palonosetron (ALOXI) IV 0.25 mg 0.25 mg - 0.25 mg - - -  PEMEtrexed (ALIMTA) IV 500 mg/m2 500 mg/m2 - 500 mg/m2 - - -   1.  Patient has atrial fibrillation on a long-term xeralto 2.  History of cerebrovascular accident when he was taken off of xeralto 3.  Hypercholesterolemia 4.  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  Somewhat improvement since yesterday had a CT scan of the chest done. Increasing shortness of breath EKG revealed atrial fibrillation with fast ventricular response CT scan has been reviewed independently  REVIEW OF SYSTEMS:    general status: Patient is feeling weak and tired.  No change in a performance status.  No chills.  No fever. HEENT  difficulty swallowing S Benson in history of present illness Lungs: No cough or shortness of breath Cardiac: No chest pain or paroxysmal nocturnal dyspnea GI: No nausea no vomiting no diarrhea no abdominal pain Skin: No rash Lower extremity no swelling Neurological system: No tingling.  No numbness.  No other focal signs  Musculoskeletal system no bony pains  GU: Patient had a high PSA but biopsy was negative All other 12 systems have been reviewed As per HPI. Otherwise, a complete review of systems is negatve.  PAST MEDICAL HISTORY: Past Medical History  Diagnosis Date  . Hypertension   . Pneumonia   . Cancer (HCC)     lung mass  . Stroke ([place])   . COPD (chronic obstructive pulmonary disease) ([place])   . Carcinoma of left lung ([place]) 07/05/2015  . Anorexia 03/2016  . Stroke [facility]) 03/2016    PAST SURGICAL HISTORY:  had a cataract surgery in the past.  Has a large prostate biopsy was done.   FAMILY HISTORY There is no significant family history of breast cancer, ovarian cancer, colon  cancer  grandmother had stomach cancer family history of heart disease     ADVANCED DIRECTIVES:  Patient does have advance healthcare directive, Patient   does not desire to make any changes  HEALTH MAINTENANCE: Social History  Substance Use Topics  . Smoking status: Former Smoker    Types: Cigarettes    Quit date: 06/27/2005  . Smokeless tobacco: None  . Alcohol Use: No     Allergies  Allergen Reactions  . Celebrex [Celecoxib]   . Zolpidem Tartrate Anxiety    Current Outpatient Prescriptions  Medication Sig Dispense Refill  . albuterol (PROVENTIL) (2.5 MG/3ML) 0.083% nebulizer solution Take 3 mLs (2.5 mg total) by nebulization every 4 (four) hours as needed for wheezing or shortness of breath. 75 mL 12  . Calcium 600-200 MG-UNIT tablet Take 1 tablet by mouth 2 (two) times daily. 60 tablet 3  . citalopram (CELEXA) 20 MG tablet Take 1 tablet (20 mg total) by mouth 2 (two) times daily. 60 tablet 3  . dexamethasone (DECADRON) 4 MG tablet   1  . Fluticasone-Salmeterol (ADVAIR DISKUS) 250-50 MCG/DOSE AEPB Inhale 1 puff into the lungs 2 (two) times daily.    . folic acid (FOLVITE) 1 MG tablet Take 1 tablet (1 mg total) by mouth daily. 30 tablet 3  . furosemide (LASIX) 40 MG tablet Take 2 tabs PO in the AM for 5 days then resume 1 tab PO daily 30 tablet 3  . lidocaine-prilocaine (EMLA) cream Apply 1 application topically as needed. 30 g 3  . metoprolol (LOPRESSOR) 50 MG tablet Take by mouth.    . rivaroxaban (XARELTO) 20 MG TABS tablet Take 20 mg by mouth daily with supper.    . VENTOLIN HFA 108 (90 BASE) MCG/ACT inhaler Inhale 1 puff into the lungs every 4 (four) hours as needed for wheezing or shortness of breath.   0   No current facility-administered medications for this visit.    OBJECTIVE: PHYSICAL EXAM:GENERAL:  Well developed, well nourished, sitting comfortably in the exam room in no acute distress.  Patient is walking with the help of cane  MENTAL STATUS:  Alert and  oriented to person, place and time. HEAD.  Ecchymosis which is resolving on the face. EYES:    Pupils equal round and reactive to light and accomodation.  No conjunctivitis or scleral icterus. ENT:  There is a small area of redness and candidiasis on the left side of the throat RESPIRATORY:  Diminished and entry on both sides.  Emphysematous chest.  His dullness on percussion on the left lower chest .  Diminished air entry on the right side Course crepitation with at the left lower base CARDIOVASCULAR:  Regular rate and rhythm without murmur, rub or gallop.  Abdominal exam revealed normal bowel sounds. The abdomen was soft, non-tender, and without masses, organomegaly, or appreciable enlargement of the abdominal aorta. BACK:  No CVA tenderness.  No tenderness on percussion of the back or rib cage.  Intermittent tenderness in the left hip SKIN: Poor skin turgor EXTREMITIES: 1+ swelling of both lower extremity LYMPH NODES: No palpable cervical, supraclavicular, axillary or inguinal adenopathy  NEUROLOGICAL: She has residual weakness on the right side walking with the help of cane PSYCH:  Appropriate.very apprehensive.    There were no vitals filed for this visit.   There is no weight on file to calculate BMI.    ECOG FS:1 - Symptomatic but completely ambulatory  LAB RESULTS:  No visits with results within 2 Day(s) from this visit. Latest known visit with results is:  Hospital Outpatient Visit on 11/13/2015  Component Date Value Ref Range Status  . Glucose-Capillary 11/13/2015 159* 65 - 99 mg/dL Final   lab results from outside lab  [facility] lab reveals PDL 1 antibody less than 1%  Ace and desires to continue treatment so tecentroq.  To be started .  ASSESSMENT:  Non-small cell carcinoma of lung left lower lobe.  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  There is extensive involvement of middle lobe of the lung  but not metabolically hyperactive.  Continue XGEVA   1.  Bronchodilator therapy patient was advised to use nebulizer on a more regular basis  Intent of chemotherapy is palliation and relief in symptoms and extending survival Patient was explained all the side effects of chemotherapy.  And informed consent has been obtained  Total duration of visit was 20    minutes.  50% or more time was spent in counseling patient and family regarding prognosis and options of treatment and available resources Carcinoma of left lung   Staging form: Lung, AJCC 7th Edition     Clinical: Stage 4 (T4, N2.  m1) - Signed by Forest Gleason, MD on 07/05/2015  Forest Gleason, MD   11/20/2015 10:07 AM

## 2015-11-22 ENCOUNTER — Encounter: Payer: Self-pay | Admitting: Oncology

## 2015-11-23 MED ORDER — VENTOLIN HFA 108 (90 BASE) MCG/ACT IN AERS: 1.0000 | INHALATION_SPRAY | RESPIRATORY_TRACT | Status: AC | PRN

## 2015-12-04 ENCOUNTER — Other Ambulatory Visit: Payer: Self-pay | Admitting: Oncology

## 2015-12-05 ENCOUNTER — Telehealth: Payer: Self-pay | Admitting: *Deleted

## 2015-12-05 NOTE — Telephone Encounter (Signed)
Received message from wife, Arbie Cookey, with concerns of shortness of breath, activity intolerance, increased cough, very little appetite, and not feeling like he can come to doctor's appointments. Denies s/s of infection. After discussion with Georgeanne Nim, NP, referral to [facility] home health made. Wife is aware and in agreement with this plan .

## 2015-12-05 NOTE — Progress Notes (Unsigned)
PSN spoke with patient's wife today about personal care giver support and how to go about finding this service.

## 2015-12-13 ENCOUNTER — Inpatient Hospital Stay: Payer: Medicare Other | Admitting: Oncology

## 2015-12-13 ENCOUNTER — Telehealth: Payer: Self-pay | Admitting: *Deleted

## 2015-12-13 ENCOUNTER — Inpatient Hospital Stay: Payer: Medicare Other

## 2015-12-13 NOTE — Telephone Encounter (Signed)
Patient will be admitted to [facility] services tomorrow-12/14/15.

## 2015-12-14 ENCOUNTER — Encounter: Payer: Self-pay | Admitting: Oncology

## 2015-12-14 LAB — SURGICAL PATHOLOGY

## 2015-12-15 ENCOUNTER — Telehealth: Payer: Self-pay | Admitting: *Deleted

## 2015-12-15 ENCOUNTER — Encounter: Payer: Self-pay | Admitting: *Deleted

## 2015-12-15 NOTE — Telephone Encounter (Signed)
[facility] nurse spoke to Dr. Zenia Resides on call md. [facility] needs md order for hospital bed. Dr. Mike Gip signed the order and the order was faxed (per [facility]'s request) to [facility] at [phone].

## 2015-12-18 ENCOUNTER — Encounter: Payer: Self-pay | Admitting: Oncology

## 2015-12-19 ENCOUNTER — Telehealth: Payer: Self-pay | Admitting: *Deleted

## 2015-12-19 ENCOUNTER — Other Ambulatory Visit: Payer: Self-pay | Admitting: Internal Medicine

## 2015-12-19 DIAGNOSIS — C3492 Malignant neoplasm of unspecified part of left bronchus or lung: Secondary | ICD-10-CM

## 2015-12-19 MED ORDER — LORAZEPAM 2 MG/ML PO CONC: 0.6000 mg | Freq: Four times a day (QID) | ORAL | Status: AC | PRN

## 2015-12-19 MED ORDER — MORPHINE SULFATE (CONCENTRATE) 10 MG /0.5 ML PO SOLN: 15.0000 mg | ORAL | Status: AC | PRN

## 2015-12-19 NOTE — Telephone Encounter (Signed)
Pt's wife states that pt is declining and cannot keep appts for treatment. Pt is requiring more care at home which is overwhelming to wife and wants to know what the next step is. Per Magda Paganini, pt can be referred to hospice if family agrees. Inform wife that will make referral today for hospice to evaluation patient today or tomorrow. Pt's wife verbalized understanding.

## 2015-12-19 NOTE — Telephone Encounter (Signed)
Significant decline in past few days, more SOB, O2 at 4.5 L/m and O2 sat only 85%. Requesting orders for Roxanol and Ativan for SOB and Anxiety

## 2015-12-19 NOTE — Telephone Encounter (Signed)
faxed

## 2016-01-10 DEATH — deceased

## 2016-03-09 DIAGNOSIS — R630 Anorexia: Secondary | ICD-10-CM

## 2016-03-09 DIAGNOSIS — I639 Cerebral infarction, unspecified: Secondary | ICD-10-CM

## 2016-03-09 HISTORY — DX: Anorexia: R63.0

## 2016-03-09 HISTORY — DX: Cerebral infarction, unspecified: I63.9

## 2016-03-22 IMAGING — CR DG CHEST 2V
1 series · 2 of 2 positions shown · non-contrast
Comparison: 08/08/2015.

CLINICAL DATA: Shortness of breath.  History of lung cancer.

EXAM:
CHEST  2 VIEW

[Series 1: dg chest 2 view · 0.14mm/px · 2 of 2 slices shown]
[im 1/2]
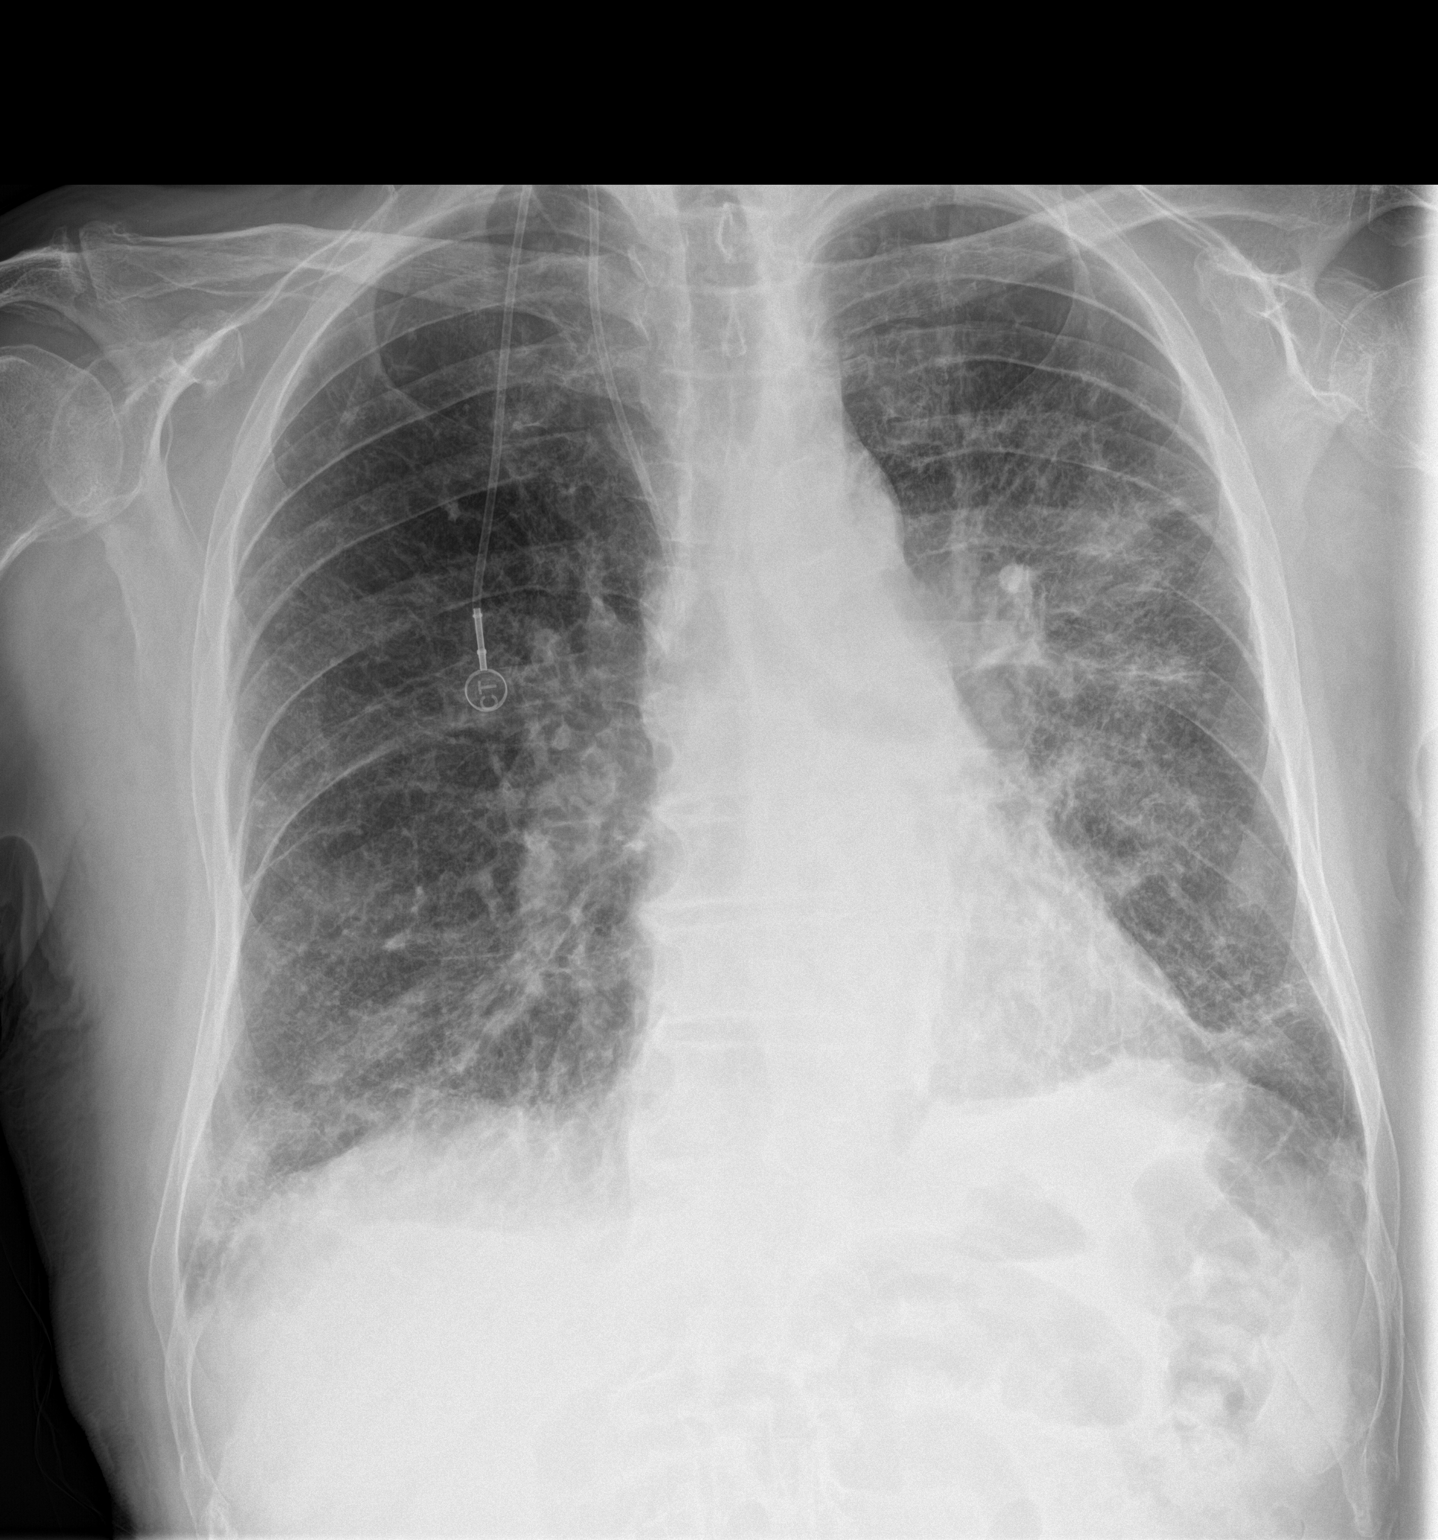
[im 2/2]
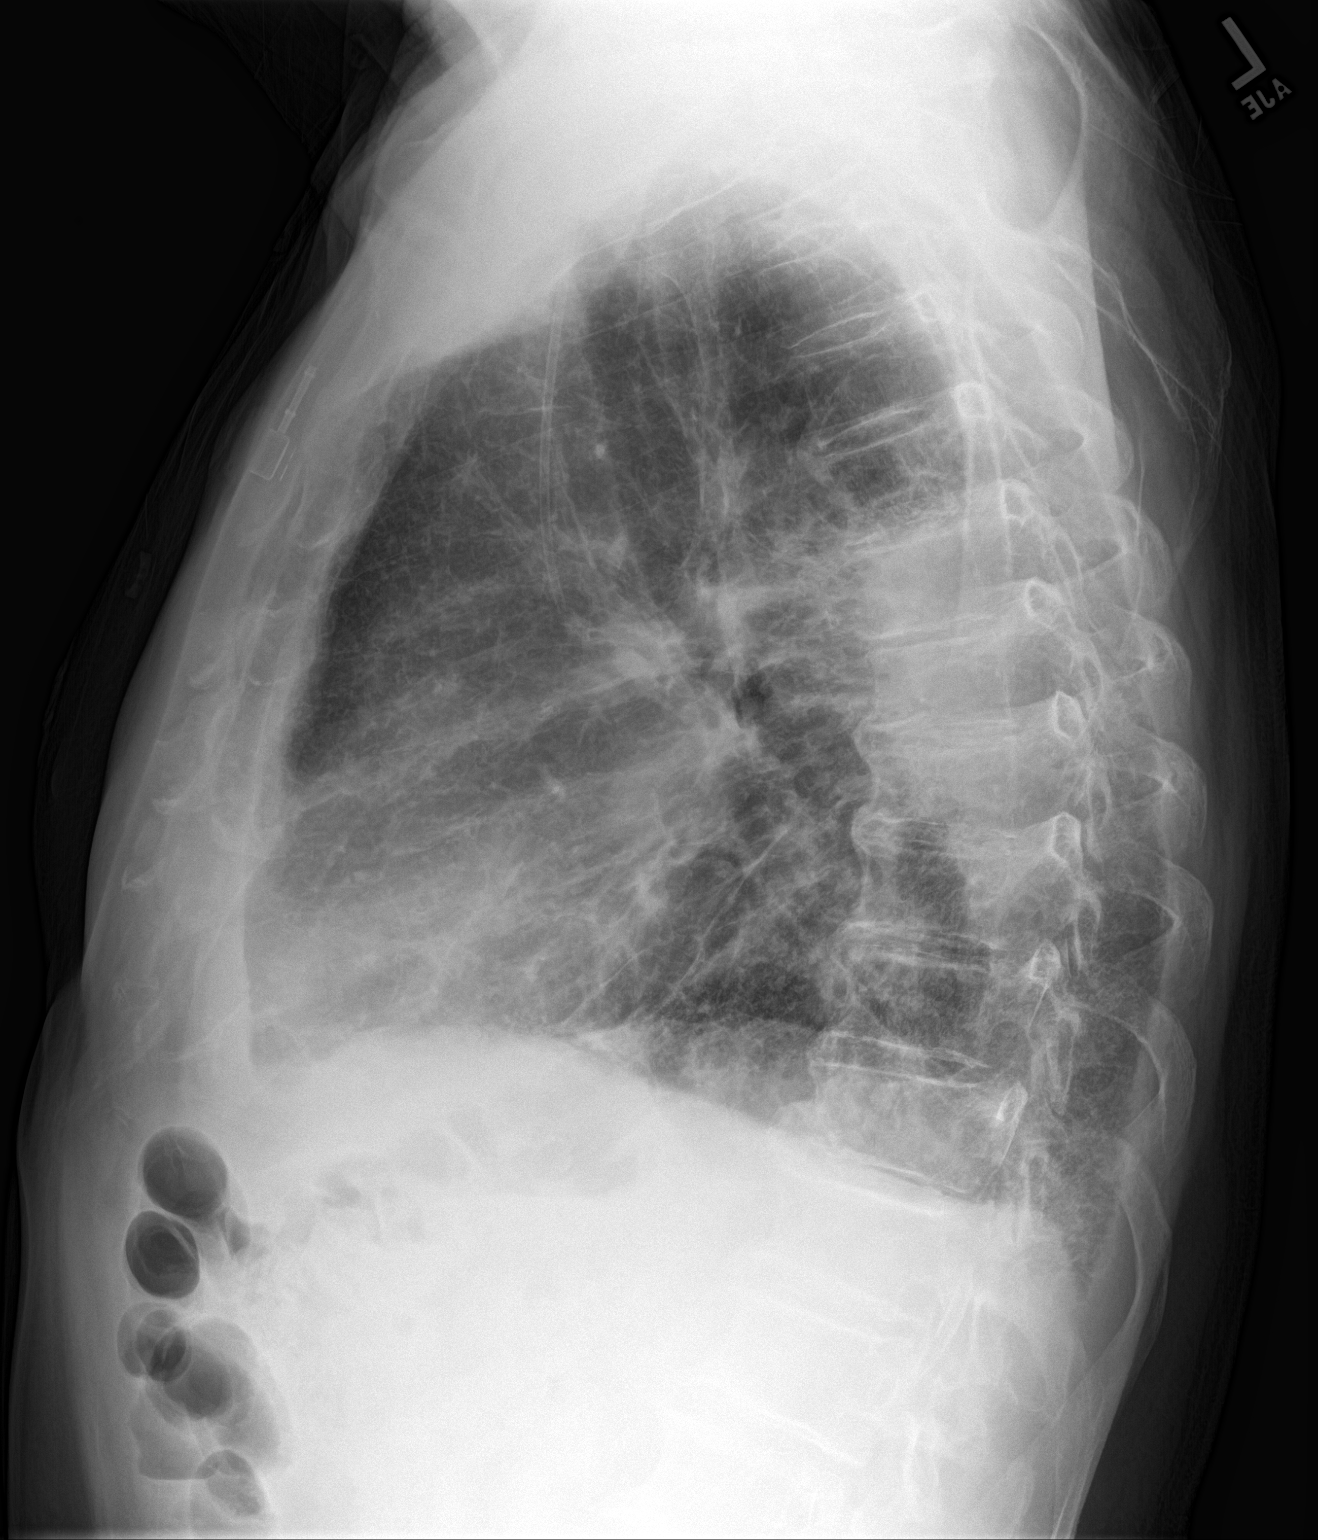

[2 of 2 positions shown; findings below may reference images not displayed]

FINDINGS: Port-A-Cath in stable position. Persistent left lung
infiltrate/mass. New right lower lobe infiltrate. These could be
related to pneumonia. Other etiologies of infiltrates including drug
reaction, interstitial tumor spread, and CHF cannot be excluded. No
prominent pleural effusion. No pneumothorax. Stable cardiomegaly. No
acute bony abnormality. Degenerative changes thoracic spine.
IMPRESSION: 1. Persistent left lung infiltrate/mass.
2. New right lower lobe infiltrate. This could represent focal
pneumonia. Other etiologies of infiltrates including drug reaction,
interstitial tumor spread, CHF cannot be excluded.
3. Persistent cardiomegaly.

## 2016-05-27 ENCOUNTER — Other Ambulatory Visit: Payer: Self-pay | Admitting: Nurse Practitioner

## 2021-12-21 NOTE — Telephone Encounter (Signed)
noted
